# Patient Record
Sex: Female | Born: 1935 | Race: White | Hispanic: No | Smoking: Former smoker
Health system: Southern US, Community
[De-identification: ages and names within clinical notes are randomized; demographics above are authoritative.]

## PROBLEM LIST (undated history)

## (undated) DIAGNOSIS — R2681 Unsteadiness on feet: Secondary | ICD-10-CM

## (undated) DIAGNOSIS — S63105A Unspecified dislocation of left thumb, initial encounter: Secondary | ICD-10-CM

## (undated) DIAGNOSIS — M1990 Unspecified osteoarthritis, unspecified site: Secondary | ICD-10-CM

## (undated) DIAGNOSIS — K219 Gastro-esophageal reflux disease without esophagitis: Secondary | ICD-10-CM

## (undated) DIAGNOSIS — I639 Cerebral infarction, unspecified: Secondary | ICD-10-CM

## (undated) HISTORY — PX: COLON SURGERY: SHX602

## (undated) HISTORY — PX: CHOLECYSTECTOMY: SHX55

## (undated) HISTORY — PX: ECTOPIC PREGNANCY SURGERY: SHX613

---

## 1998-06-22 ENCOUNTER — Other Ambulatory Visit: Admission: RE | Admit: 1998-06-22 | Discharge: 1998-06-22 | Payer: Self-pay | Admitting: Obstetrics and Gynecology

## 1999-07-14 ENCOUNTER — Other Ambulatory Visit: Admission: RE | Admit: 1999-07-14 | Discharge: 1999-07-14 | Payer: Self-pay | Admitting: Obstetrics and Gynecology

## 2000-07-17 ENCOUNTER — Other Ambulatory Visit: Admission: RE | Admit: 2000-07-17 | Discharge: 2000-07-17 | Payer: Self-pay | Admitting: Obstetrics and Gynecology

## 2000-10-25 ENCOUNTER — Ambulatory Visit (HOSPITAL_COMMUNITY): Admission: RE | Admit: 2000-10-25 | Discharge: 2000-10-25 | Payer: Self-pay | Admitting: Gastroenterology

## 2000-10-25 ENCOUNTER — Encounter (INDEPENDENT_AMBULATORY_CARE_PROVIDER_SITE_OTHER): Payer: Self-pay | Admitting: *Deleted

## 2001-07-19 ENCOUNTER — Other Ambulatory Visit: Admission: RE | Admit: 2001-07-19 | Discharge: 2001-07-19 | Payer: Self-pay | Admitting: Obstetrics and Gynecology

## 2002-09-03 ENCOUNTER — Other Ambulatory Visit: Admission: RE | Admit: 2002-09-03 | Discharge: 2002-09-03 | Payer: Self-pay | Admitting: Obstetrics and Gynecology

## 2003-09-13 ENCOUNTER — Emergency Department (HOSPITAL_COMMUNITY): Admission: EM | Admit: 2003-09-13 | Discharge: 2003-09-13 | Payer: Self-pay | Admitting: Emergency Medicine

## 2003-09-15 ENCOUNTER — Emergency Department (HOSPITAL_COMMUNITY): Admission: EM | Admit: 2003-09-15 | Discharge: 2003-09-15 | Payer: Self-pay | Admitting: Emergency Medicine

## 2004-03-23 ENCOUNTER — Other Ambulatory Visit: Admission: RE | Admit: 2004-03-23 | Discharge: 2004-03-23 | Payer: Self-pay | Admitting: Obstetrics and Gynecology

## 2008-10-22 ENCOUNTER — Ambulatory Visit (HOSPITAL_COMMUNITY): Admission: RE | Admit: 2008-10-22 | Discharge: 2008-10-22 | Payer: Self-pay | Admitting: Internal Medicine

## 2008-11-10 ENCOUNTER — Ambulatory Visit (HOSPITAL_COMMUNITY): Admission: RE | Admit: 2008-11-10 | Discharge: 2008-11-10 | Payer: Self-pay | Admitting: General Surgery

## 2008-11-10 ENCOUNTER — Encounter (INDEPENDENT_AMBULATORY_CARE_PROVIDER_SITE_OTHER): Payer: Self-pay | Admitting: General Surgery

## 2011-01-24 LAB — COMPREHENSIVE METABOLIC PANEL
AST: 24 U/L (ref 0–37)
BUN: 18 mg/dL (ref 6–23)
CO2: 27 mEq/L (ref 19–32)
Chloride: 103 mEq/L (ref 96–112)
Creatinine, Ser: 1.15 mg/dL (ref 0.4–1.2)
GFR calc non Af Amer: 45 mL/min — ABNORMAL LOW (ref >60)
Glucose, Bld: 91 mg/dL (ref 70–99)
Total Bilirubin: 0.9 mg/dL (ref 0.3–1.2)

## 2011-01-24 LAB — CBC
HCT: 39.6 % (ref 36.0–46.0)
Hemoglobin: 13.4 g/dL (ref 12.0–15.0)
MCV: 91.2 fL (ref 78.0–100.0)
RBC: 4.34 MIL/uL (ref 3.87–5.11)
WBC: 6.8 10*3/uL (ref 4.0–10.5)

## 2011-01-24 LAB — DIFFERENTIAL
Basophils Absolute: 0 10*3/uL (ref 0.0–0.1)
Eosinophils Relative: 1 % (ref 0–5)
Lymphocytes Relative: 28 % (ref 12–46)
Neutrophils Relative %: 62 % (ref 43–77)

## 2011-02-22 NOTE — Op Note (Signed)
NAMEVONCEIL, Lindsey Hensley                 ACCOUNT NO.:  [id number]   MEDICAL RECORD NO.:  [id number]          PATIENT TYPE:  AMB   LOCATION:  SDS                          FACILITY:  [facility]   PHYSICIAN:  Ollen Gross. Vernell Morgans, M.D. DATE OF BIRTH:  10-01-1922   DATE OF PROCEDURE:  11/10/2008  DATE OF DISCHARGE:  11/10/2008                               OPERATIVE REPORT   PREOPERATIVE DIAGNOSIS:  Gallstones.   POSTOPERATIVE DIAGNOSIS:  Gallstones.   PROCEDURE:  Laparoscopic cholecystectomy with intraoperative  cholangiogram.   SURGEON:  Ollen Gross. Vernell Morgans, MD   ANESTHESIA:  General endotracheal.   PROCEDURE:  After informed consent was obtained, the patient was brought  to the operating room and placed in supine position on the operating  room table.  After adequate induction of general anesthesia, the  patient's abdomen was prepped with Betadine and draped in the usual  sterile manner.  The area below the umbilicus was infiltrated with 0.25%  Marcaine.  A small incision was made with a #15 blade knife.  This  incision was carried down through the subcutaneous tissue bluntly with a  hemostat and [employer] retractors until the linea alba was identified.  The linea alba was incised with a #15 blade knife and each side was  grasped with Kocher clamps and elevated anteriorly.  The preperitoneal  space was then probed bluntly with a hemostat until the peritoneum was  opened.  Access was gained to the abdominal cavity.  A 0 Vicryl  pursestring stitch was placed in the fascia around the opening.  A  Hasson cannula was placed through the opening and anchored in place with  a previously placed Vicryl pursestring stitch.  The abdomen was then  insufflated with carbon dioxide without difficulty.  A laparoscope was  inserted through the Hasson cannula and the right upper quadrant was  inspected.  The patient had a large floppy gallbladder and the liver was  easily identified.  Next, the epigastric region  was infiltrated with a  0.25% Marcaine.  A small incision was made with a #15 blade knife and a  10-mm port was placed bluntly through this incision into the abdominal  cavity under direct vision.   Sites were then chosen laterally on the right side of the abdomen for  placement of 5-mm ports.  Each of these areas was infiltrated with 0.25%  Marcaine.  Small stab incisions were made with a  #15 blade knife and 5-  mm ports were placed bluntly through these incisions into the abdominal  cavity under direct vision.  A blunt grasper was placed through the  lateral most 5-mm port and used to grasp the dome of the gallbladder and  elevated anteriorly and superiorly.  There were some omental adhesions  to the body of the gallbladder, which were taken down by a combination  of some blunt dissection and sharp dissection with the electrocautery as  well as with the laparoscopic scissors.  Once this was accomplished,  another blunt grasper was then placed through the other 5-mm port and  used to retract on the  body and neck of the gallbladder.  Using the  electrocautery, the peritoneal reflection at the gallbladder neck was  opened.  Blunt dissection was then carried out in this area until the  gallbladder neck cystic duct junction was readily identified and a good  window was created.  A single clip was placed on the gallbladder neck.  A small ductotomy was made just below the clip.  A 14-gauge Angiocath  was placed percutaneously through the anterior abdominal wall under  direct vision.  A Reddick cholangiogram catheter was placed through the  Angiocath and flushed.  The Reddick catheter was then placed within the  cystic duct and anchored in place the clip.  Cholangiogram was obtained  that showed no filling defects, good emptying in duodenum, and adequate  length on the cystic duct.  The common duct was fairly dilated, but  there were no filling defects and no obstruction.  Anchoring clip and   catheters were removed from the patient.  Three clips were placed  proximal to the cystic duct and duct was divided between the 2 sets of  clips.  Posterior to this, the cystic artery was identified and again  dissected bluntly in a circumferential manner until a good window was  created.  Two clips were placed proximally and 1 distally on the artery,  and the artery was divided between the 2.  Next, a laparoscopic hook  cautery device was used to separate the gallbladder from the liver bed.  There were a couple of either lymphatics or vessels on the liver bed  that were fairly prominent that were also controlled with clips.  Prior  to completely detaching the gallbladder from liver bed, the liver bed  was inspected.  Several small bleeding points were coagulated with the  electrocautery until the area was completely hemostatic.  The  gallbladder was then detached and wrested away from the liver bed  without difficulty.  A laparoscopic bag was inserted through the  epigastric port.  The gallbladder was placed in the bag and the bag was  sealed.  The abdomen was then irrigated with copious amounts of saline  until the effluent was clear.  The laparoscope was then removed through  the epigastric port.  A gallbladder grasper was placed through the  Hasson cannula and used to grasp to open the bag.  The bag with the  gallbladder was removed through the infraumbilical port without  difficulty with the Hasson cannula.  The fascial defect was then closed  with previously placed Vicryl pursestring stitch as well as with another  figure-of-eight 0 Vicryl stitch.  The rest of the ports were removed  under direct vision and were found to be hemostatic.  The gas was  allowed to escape.  The skin incisions were all closed with interrupted  4-0 Monocryl subcuticular stitches and Dermabond dressings were applied.  The patient tolerated the procedure well.  At the end of the case, all  needle, sponge, and  instrument counts were correct.  The patient was  then awakened and taken to recovery room in stable condition.      Ollen Gross. Vernell Morgans, M.D.  Electronically Signed     PST/MEDQ  D:  11/10/2008  T:  11/11/2008  Job:  [id number]

## 2011-02-25 ENCOUNTER — Inpatient Hospital Stay (HOSPITAL_COMMUNITY): Payer: Medicare HMO

## 2011-02-25 ENCOUNTER — Inpatient Hospital Stay (HOSPITAL_COMMUNITY)
Admission: AD | Admit: 2011-02-25 | Discharge: 2011-03-01 | DRG: 065 | Disposition: A | Discharge status: Home Health | Payer: Medicare HMO | Source: Ambulatory Visit | Attending: Internal Medicine | Admitting: Internal Medicine

## 2011-02-25 DIAGNOSIS — N183 Chronic kidney disease, stage 3 unspecified: Secondary | ICD-10-CM | POA: Diagnosis present

## 2011-02-25 DIAGNOSIS — I440 Atrioventricular block, first degree: Secondary | ICD-10-CM | POA: Diagnosis not present

## 2011-02-25 DIAGNOSIS — I6350 Cerebral infarction due to unspecified occlusion or stenosis of unspecified cerebral artery: Principal | ICD-10-CM | POA: Diagnosis present

## 2011-02-25 DIAGNOSIS — E559 Vitamin D deficiency, unspecified: Secondary | ICD-10-CM | POA: Diagnosis present

## 2011-02-25 DIAGNOSIS — M810 Age-related osteoporosis without current pathological fracture: Secondary | ICD-10-CM | POA: Diagnosis present

## 2011-02-25 DIAGNOSIS — E785 Hyperlipidemia, unspecified: Secondary | ICD-10-CM | POA: Diagnosis present

## 2011-02-25 DIAGNOSIS — R269 Unspecified abnormalities of gait and mobility: Secondary | ICD-10-CM | POA: Diagnosis present

## 2011-02-25 DIAGNOSIS — R7301 Impaired fasting glucose: Secondary | ICD-10-CM | POA: Diagnosis present

## 2011-02-25 DIAGNOSIS — Z79899 Other long term (current) drug therapy: Secondary | ICD-10-CM

## 2011-02-25 DIAGNOSIS — G8190 Hemiplegia, unspecified affecting unspecified side: Secondary | ICD-10-CM | POA: Diagnosis present

## 2011-02-25 DIAGNOSIS — I129 Hypertensive chronic kidney disease with stage 1 through stage 4 chronic kidney disease, or unspecified chronic kidney disease: Secondary | ICD-10-CM | POA: Diagnosis present

## 2011-02-25 DIAGNOSIS — E039 Hypothyroidism, unspecified: Secondary | ICD-10-CM | POA: Diagnosis present

## 2011-02-25 LAB — URINALYSIS, ROUTINE W REFLEX MICROSCOPIC
Bilirubin Urine: NEGATIVE
Glucose, UA: NEGATIVE mg/dL
Hgb urine dipstick: NEGATIVE
Ketones, ur: NEGATIVE mg/dL
pH: 7.5 (ref 5.0–8.0)

## 2011-02-25 LAB — CBC
HCT: 35.7 % — ABNORMAL LOW (ref 36.0–46.0)
Platelets: 171 10*3/uL (ref 150–400)
RDW: 13.8 % (ref 11.5–15.5)
WBC: 6.4 10*3/uL (ref 4.0–10.5)

## 2011-02-25 LAB — APTT: aPTT: 44 seconds — ABNORMAL HIGH (ref 24–37)

## 2011-02-25 LAB — COMPREHENSIVE METABOLIC PANEL
ALT: 13 U/L (ref 0–35)
Albumin: 4.1 g/dL (ref 3.5–5.2)
Alkaline Phosphatase: 44 U/L (ref 39–117)
Glucose, Bld: 92 mg/dL (ref 70–99)
Potassium: 3.6 mEq/L (ref 3.5–5.1)
Sodium: 143 mEq/L (ref 135–145)
Total Protein: 7.3 g/dL (ref 6.0–8.3)

## 2011-02-25 MED ORDER — GADOBENATE DIMEGLUMINE 529 MG/ML IV SOLN
12.0000 mL | Freq: Once | INTRAVENOUS | Status: AC | PRN
  Administered 2011-02-25: 12 mL via INTRAVENOUS

## 2011-02-25 NOTE — Procedures (Signed)
[facility]  Patient:    Lindsey Hensley, Lindsey Hensley                        MRN: [id number] Proc. Date: 10/25/00 Adm. Date:  [id number] Attending:  Rich Brave CC:         Esmeralda Arthur, M.D.   Procedure Report  PROCEDURE PERFORMED:  Colonoscopy with biopsy.  ENDOSCOPIST:  Florencia Reasons, M.D.  INDICATIONS FOR PROCEDURE:  The patient is a 77 year old with apparent family history of colon polyps, for colon cancer screen.  FINDINGS:  Tiny distal rectal polyp, biopsied.  Small distal rectal ulcerations, possibly prep related.  DESCRIPTION OF PROCEDURE:  The nature, purpose and risks of the procedure had been discussed with the patient, who provided written consent.  Sedation was fentanyl 50 mcg and Versed 7 mg IV without arrhythmias or desaturation. The Olympus colonoscope was advanced to the cecum and pullback was then initiated.  The quality of the prep was excellent and it is felt that all areas were well seen.  We used the adult video colonoscope for this procedure and the procedure went fairly smoothly by use of that scope.  The quality of the prep was excellent and it is felt that all areas were well seen.  There was a tiny hyperplastic appearing sessile polyp in the rectum removed by cold biopsy and there were a couple of small shallow ulcerations measuring about 2 to 3 mm across with surrounding erythema in the distal rectum, etiology unclear, which I biopsied.  No diverticulosis, large polyps, cancer, colitis or vascular malformations.  The patient tolerated the procedure well and there were no apparent complications.  IMPRESSION:  Essentially normal exam.  Tiny rectal polyp and distal rectal ulcerations as described above, not felt to be particularly clinically significant.  PLAN:  Await pathology on todays biopsies.  Anticipate follow-up colonoscopy in five years because of the apparent family history of colon polyps. DD:   10/25/00 TD:  10/26/00 Job: [id number]

## 2011-02-26 LAB — LIPID PANEL
HDL: 88 mg/dL (ref >39)
Total CHOL/HDL Ratio: 2.2 RATIO

## 2011-02-26 LAB — CARDIAC PANEL(CRET KIN+CKTOT+MB+TROPI)
CK, MB: 3.1 ng/mL (ref 0.3–4.0)
Relative Index: 1.7 (ref 0.0–2.5)
Troponin I: <0.3 ng/mL (ref <0.30)

## 2011-02-27 DIAGNOSIS — I059 Rheumatic mitral valve disease, unspecified: Secondary | ICD-10-CM

## 2011-02-28 DIAGNOSIS — I6330 Cerebral infarction due to thrombosis of unspecified cerebral artery: Secondary | ICD-10-CM

## 2011-02-28 LAB — CBC
MCH: 30.6 pg (ref 26.0–34.0)
Platelets: 204 10*3/uL (ref 150–400)
RBC: 4.21 MIL/uL (ref 3.87–5.11)
RDW: 13.6 % (ref 11.5–15.5)
WBC: 5 10*3/uL (ref 4.0–10.5)

## 2011-02-28 LAB — BASIC METABOLIC PANEL
Chloride: 103 mEq/L (ref 96–112)
Creatinine, Ser: 1.03 mg/dL (ref 0.4–1.2)
GFR calc Af Amer: >60 mL/min (ref >60)
GFR calc non Af Amer: 51 mL/min — ABNORMAL LOW (ref >60)
Potassium: 3.7 mEq/L (ref 3.5–5.1)

## 2011-03-10 NOTE — H&P (Signed)
Lindsey, Hensley                 ACCOUNT NO.:  [id number]  MEDICAL RECORD NO.:  [id number]           PATIENT TYPE:  I  LOCATION:  3012                         FACILITY:  [facility]  PHYSICIAN:  Kari Baars, M.D.  DATE OF BIRTH:  [birth year]  DATE OF ADMISSION:  02/25/2011 DATE OF DISCHARGE:                             HISTORY & PHYSICAL   HISTORY OF PRESENT ILLNESS:  Ms. Lindsey Hensley is an 77 year old white female with history of hypertension, impaired fasting glucose, and prior history of retinal hemorrhage who presented to our office today with complaint of unsteady gait and left leg weakness x5 days.  Ms. Lindsey Hensley recently had an ambulatory blood pressure cuff to evaluate for white coat hypertension.  Her average blood pressure was 154/77 with a blood pressure in our office of 186/92 consistent with hypertension with an element of white-coat hypertension.  She was started on lisinopril about 2 weeks ago for this which she tolerated fairly well for about 1 week. She did have slight chest feeling, which was different, but did not have any dizziness.  On about the 8th day of taking the medications, she awoke at 3 a.m. and tried to walk to the bathroom when she became lightheaded and fell and hit her head on the bathtub.  She states that she had difficulty getting up in the bathtub and noticed that her left leg was weak.  She stopped the lisinopril as her son read the side effects and stated that it could cause dizziness and leg numbness. However, her symptoms have persisted throughout this week and she called our office today and was brought in late this afternoon for evaluation. Her main complaint at this point is continued left leg weakness and numbness with difficulty walking due to dragging that leg.  She has a record of her blood pressures, which were in the 128s over 70s while she was on the medication and had been in the 148s over 70s since she stopped it.  She has no prior history  of stroke.  She has not been taking an aspirin.  She has not had any bleeding issues.  REVIEW OF SYSTEMS:  She does complain of some left rib tenderness.  She suffered a scalp hematoma after a fall, which is mildly tender.  All other systems are negative except as noted in the HPI.  PAST MEDICAL HISTORY: 1. Hypertension. 2. Impaired fasting glucose. 3. Remote history of retinal hemorrhage. 4. Chronic renal insufficiency, stage III. 5. Osteoporosis with vitamin D deficiency -- declines therapy due to     perceived bisphosphonate intolerance. 6. History of microscopic hematuria. 7. History of elevated liver function tests secondary to     cholelithiasis (10/2008) status post cholecystectomy. 8. History of left ankle fracture (01/11). 9. Status post tonsillectomy and adenoidectomy. 10.Status post ectopic pregnancy (1962). 11.Status post bilateral cataract repairs. 12.Status post cholecystectomy.  CURRENT MEDICATIONS: 1. Lisinopril 20 mg daily -- she has not taken this for the past 5     days. 2. Diazepam 5 mg one-half b.i.d. p.r.n. 3. Vitamin D 1000 units daily. 4. Calcium/vitamin D b.i.d. 5. Multivitamin daily.  ALLERGIES: 1. FOSAMAX and ACTONEL caused arthralgias. 2. SULFA caused nausea.  SOCIAL HISTORY:  She is widowed for 31 years.  She has one son who accompanies her today.  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  Smoked one-half pack per day for 20 years, but quit in 01/2008. Occasional alcohol.  FAMILY HISTORY:  Father died at 1.  Her mother died of 8 from meningitis.  Sister died at 35 due to diabetes.  She has 4 brothers, one of whom has diabetes.  Her son has gout.  PHYSICAL EXAMINATION:  VITAL SIGNS:  Blood pressure 166/86, pulse 77, respirations 20, weight 127, which is down 3 pounds since 04/19. GENERAL:  Pleasant female in no acute distress. HEENT:  Pupils are equal, round, and reactive to light.  Extraocular movements intact.  Oropharynx  moist. NECK:  Supple without lymphadenopathy, JVD, or carotid bruits. HEART:  Regular rate and rhythm without murmurs, rubs, or gallops. LUNGS:  Clear to auscultation bilaterally. ABDOMEN:  Soft, nondistended, nontender with normoactive bowel sounds. EXTREMITIES:  No clubbing, cyanosis, or edema. NEUROLOGIC:  Cranial nerves II through XII are intact.  Motor strength is 4- out of 5 in the left lower extremity and 5/5 in all other extremities.  Deep tendon reflexes are 2+ and symmetric.  She has a decrease in sensation to monofilament temperature and vibration of the left foot.  Her gait shows dragging of the left leg.  Romberg is negative.  Finger-to-nose is intact.  ASSESSMENT/PLAN: 1. Probable subacute stroke with left hemiparesis -- she has left leg     weakness, sensory changes, and gait instability which is concerning     for a subacute right-sided stroke.  Her symptoms began 5 days ago,     so lytics are not an option.  Differential diagnosis does include a     subdural hematoma related to her fall.  She does not have any pain     over the left hip to suggest any hip injury as the cause of her     gait and sensory changes.  She will be admitted to the hospital for     further evaluation with an MRI/MRA of the brain tonight.  If we are     unable to obtain this in a timely fashion, we will proceed with CT     of the head to rule out subdural hematoma.  At this point, MRI     should be sufficient in evaluating for both stroke and subacute     bleed.  If stroke is confirmed, I suspect it will further evaluate     for secondary causes with carotid Dopplers, echocardiogram, and     laboratory studies to include fasting lipids, sed rate, and     hemoglobin A1c.  PT/OT evaluation.  We will perform a     speech/swallow evaluation, although she does not have any apparent     speech issues to suggest a risk of dysphagia.  She will be started     on aspirin if her scan is negative for  bleed. 2. Unsteady gait -- secondary to probable subacute stroke with left     hemiparesis.  PT/OT evaluation.  Fall precautions. 3. Hypertension -- she felt like the symptoms were related to     lisinopril, so remains reticent to start back on an ACE inhibitor.     If her blood pressure remains elevated, consider low-dose     alternatives such as amlodipine.  At this  point, blood pressure     goal on the setting of her stroke would be around 140/85 with     avoidance of     hypotension. 4. Disposition -- anticipate discharge in 2-3 days once evaluation is     completed.  Evaluation may be slower over the weekend.     Kari Baars, M.D.     WS/MEDQ  D:  02/25/2011  T:  02/25/2011  Job:  [id number]  Electronically Signed by W. Buren Kos M.D. on 03/10/2011 04:30:34 PM

## 2011-03-10 NOTE — Discharge Summary (Signed)
Lindsey Hensley, Lindsey Hensley                 ACCOUNT NO.:  [id number]  MEDICAL RECORD NO.:  [id number]           PATIENT TYPE:  I  LOCATION:  3012                         FACILITY:  [facility]  PHYSICIAN:  Kari Baars, M.D.  DATE OF BIRTH:  11-09-1921  DATE OF ADMISSION:  02/25/2011 DATE OF DISCHARGE:  03/01/2011                              DISCHARGE SUMMARY   DISCHARGE DIAGNOSES: 1. Acute right frontal cerebrovascular accident resulting in left     hemiparesis, gait instability. 2. Hypertension. 3. Hyperlipidemia. 4. Subacute hypothyroidism. 5. Impaired fasting glucose. 6. Remote history of retinal hemorrhage. 7. Chronic renal insufficiency, stage III. 8. Osteoporosis with vitamin D deficiency (declined therapy due to     bisphosphate intolerance). 9. History of microscopic hematuria. 10.History of elevated liver function tests secondary to     cholelithiasis (January 2010), status post cholecystectomy. 11.History of left ankle fracture (January 2011). 12.Status post tonsillectomy and adenoidectomy. 13.Status post ectopic pregnancy (1962). 14.Status post bilateral cataract repairs. 15.Status post cholecystectomy.  DISCHARGE MEDICATIONS: 1. Aspirin 325 mg daily. 2. Lipitor 10 mg nightly. 3. Vitamin D 2000 units daily. 4. Calcium with vitamin D b.i.d. 5. Multivitamin daily. 6. She was instructed to stop her lisinopril.  HOSPITAL PROCEDURES: 1. MRI/MRA of the brain in neck (Feb 26, 2011), multifocal areas of     subcentimeter acute infarction affecting the right frontal     parasagittal cortex and subcortical white matter.  Findings     represent acute anterior cerebral artery thrombosis or embolus.     Distal right anterior cerebral artery truncation most consistent     with thrombosis.  Unremarkable MR angiography of the extracranial     circulation. 2. Transthoracic echocardiogram (May 2o, 2012), no cardiac source of     emboli identified.  No defect or patent foramen ovale  identified.     Ejection fraction 55-60% with mild mitral and aortic regurgitation. 3. Carotid Dopplers/transcranial Dopplers (Feb 28, 2011), no internal     carotid artery stenosis.  HOSPITAL CONSULTS: 1. Physical Therapy/Occupational Therapy/Speech Therapy. 2. [facility] inpatient rehabilitation.  HISTORY OF PRESENT ILLNESS:  For full details, please see dictated history and physical.  Briefly, Lindsey Hensley is an 77 year old white female with a history of hypertension, impaired fasting glucose, and prior retinal hemorrhage who presented to our office on the day of admission with a complaint of unsteady gait and left leg weakness for 5 days.  The patient had recently undergone an ambulatory blood pressure monitor in our office to evaluate for markedly elevated blood pressures in our office.  Her blood pressure in the office was 186/92 and her 24- hour ambulatory blood pressure cuff was 154/77 consistent with hypertension with an element of white coat hypertension.  Given these findings, she was started on lisinopril which she tolerated well for about a week.  However, on the eighth day of taking the medication, she awoke at 3 a.m. and felt very dizzy when she fell and hit her head on the bathtub.  She noticed when she tried to get up that her left leg was weak.  At that point, thinking that the  medication was the culprit, she stopped the lisinopril.  However, her symptoms failed to improve over the following 5 days and she called our office on Friday afternoon Feb 25, 2011 and was brought into the office for evaluation.  In the office, she was found to have significant gait instability and left leg weakness concerning for an subacute stroke.  She was admitted for further management.  HOSPITAL COURSE:  The patient was admitted to a telemetry bed.  She underwent an MRI/MRA of the brain to evaluate which showed findings consistent with multifocal right frontal stroke as above.  She was placed  on aspirin therapy promptly upon admission and secondary evaluation was undertaken with echocardiogram and carotid Doppler/transcranial Dopplers.  These showed no significant internal carotid artery stenosis.  There was no identifiable cardiac emboli or significant valvular defects.  No PFO seen on echocardiogram.  Physical Therapy and Occupational Therapy as well as Speech Therapy saw the patient and recommended 24-hour assistance.  Her sister is arriving today from [place] and will be staying with her indefinitely to provide 24-hour care.  She does continue to have gait instability with left leg weakness and some element of neglect.  However, she is able to ambulate with a walker and with assistance and all are in agreement that she is stable for discharge home with 24-hour assistance.  Of note, her fasting lipid profile did show an LDL of 86.  Given the ideal range of less than 70 in the setting of a stroke, she will be started on low-dose statin therapy.  Her blood pressure remained much lower throughout the majority of this hospitalization.  She did have some periods of 160, but the majority of the blood pressures were in the 120s.  She even had isolated hypotension with pressures into the 90s. Given this lability and overall improvement, she has remained off of antihypertensive therapy and will discontinue lisinopril upon discharge.  DISCHARGE LABS:  CBC shows a white count of 5.0, hemoglobin 12.9, platelets 204.  BMET shows sodium 140, potassium 3.7, chloride 103, bicarb 30, BUN 22, creatinine 1.03, glucose 76.  Fasting lipid profile shows a total cholesterol 190, triglyceride 80, HDL 88, LDL 86, hemoglobin A1c 5.6.  TSH 6.969 - this will need to be rechecked as an outpatient with consideration of treatment for subclinical hypothyroidism.  Sedimentation rate 13.  CONDITION ON DISCHARGE:  Improved.  DISCHARGE DIET:  Cardiac prudent.  DISCHARGE INSTRUCTIONS:  She was instructed  to use a walker at all times and to have 24-hour assistance.  She should practice strict fall precautions due to her high risk of fall.  We will arrange home health physical therapy and occupational therapy.  HOSPITAL FOLLOWUP:  She will follow up with Dr. Clelia Croft within the next 7- 10 days (she has an appointment artery scheduled on March 11, 2011, which we will suffice).  DISPOSITION:  To home with her sister.  TIME OF DISCHARGE ACTIVITIES:  Forty minutes.     Kari Baars, M.D.     WS/MEDQ  D:  03/01/2011  T:  03/01/2011  Job:  [id number]  Electronically Signed by W. Buren Kos M.D. on 03/10/2011 04:30:38 PM

## 2011-07-26 ENCOUNTER — Other Ambulatory Visit: Payer: Self-pay | Admitting: Radiology

## 2013-05-23 ENCOUNTER — Emergency Department (HOSPITAL_COMMUNITY): Payer: Medicare HMO

## 2013-05-23 ENCOUNTER — Inpatient Hospital Stay (HOSPITAL_COMMUNITY)
Admission: EM | Admit: 2013-05-23 | Discharge: 2013-05-29 | DRG: 470 | Disposition: A | Discharge status: Skilled Nursing Facility | Payer: Medicare HMO | Attending: Internal Medicine | Admitting: Internal Medicine

## 2013-05-23 ENCOUNTER — Encounter (HOSPITAL_COMMUNITY): Payer: Self-pay | Admitting: Emergency Medicine

## 2013-05-23 DIAGNOSIS — Z7901 Long term (current) use of anticoagulants: Secondary | ICD-10-CM

## 2013-05-23 DIAGNOSIS — IMO0002 Reserved for concepts with insufficient information to code with codable children: Secondary | ICD-10-CM

## 2013-05-23 DIAGNOSIS — D62 Acute posthemorrhagic anemia: Secondary | ICD-10-CM | POA: Diagnosis not present

## 2013-05-23 DIAGNOSIS — Z9089 Acquired absence of other organs: Secondary | ICD-10-CM

## 2013-05-23 DIAGNOSIS — R296 Repeated falls: Secondary | ICD-10-CM | POA: Diagnosis present

## 2013-05-23 DIAGNOSIS — Z87891 Personal history of nicotine dependence: Secondary | ICD-10-CM

## 2013-05-23 DIAGNOSIS — R29898 Other symptoms and signs involving the musculoskeletal system: Secondary | ICD-10-CM | POA: Diagnosis present

## 2013-05-23 DIAGNOSIS — D649 Anemia, unspecified: Secondary | ICD-10-CM

## 2013-05-23 DIAGNOSIS — Z882 Allergy status to sulfonamides status: Secondary | ICD-10-CM

## 2013-05-23 DIAGNOSIS — Z7982 Long term (current) use of aspirin: Secondary | ICD-10-CM

## 2013-05-23 DIAGNOSIS — I69998 Other sequelae following unspecified cerebrovascular disease: Secondary | ICD-10-CM

## 2013-05-23 DIAGNOSIS — K5900 Constipation, unspecified: Secondary | ICD-10-CM | POA: Diagnosis present

## 2013-05-23 DIAGNOSIS — Z79899 Other long term (current) drug therapy: Secondary | ICD-10-CM

## 2013-05-23 DIAGNOSIS — S72009A Fracture of unspecified part of neck of unspecified femur, initial encounter for closed fracture: Principal | ICD-10-CM | POA: Diagnosis present

## 2013-05-23 DIAGNOSIS — Z8673 Personal history of transient ischemic attack (TIA), and cerebral infarction without residual deficits: Secondary | ICD-10-CM

## 2013-05-23 DIAGNOSIS — Y93H2 Activity, gardening and landscaping: Secondary | ICD-10-CM

## 2013-05-23 DIAGNOSIS — I951 Orthostatic hypotension: Secondary | ICD-10-CM | POA: Diagnosis not present

## 2013-05-23 DIAGNOSIS — S72002A Fracture of unspecified part of neck of left femur, initial encounter for closed fracture: Secondary | ICD-10-CM

## 2013-05-23 DIAGNOSIS — Y92009 Unspecified place in unspecified non-institutional (private) residence as the place of occurrence of the external cause: Secondary | ICD-10-CM

## 2013-05-23 DIAGNOSIS — D72829 Elevated white blood cell count, unspecified: Secondary | ICD-10-CM | POA: Diagnosis not present

## 2013-05-23 DIAGNOSIS — K219 Gastro-esophageal reflux disease without esophagitis: Secondary | ICD-10-CM | POA: Diagnosis present

## 2013-05-23 DIAGNOSIS — Z602 Problems related to living alone: Secondary | ICD-10-CM

## 2013-05-23 HISTORY — DX: Unspecified osteoarthritis, unspecified site: M19.90

## 2013-05-23 HISTORY — DX: Gastro-esophageal reflux disease without esophagitis: K21.9

## 2013-05-23 HISTORY — DX: Cerebral infarction, unspecified: I63.9

## 2013-05-23 LAB — BASIC METABOLIC PANEL
BUN: 20 mg/dL (ref 6–23)
CO2: 27 mEq/L (ref 19–32)
Chloride: 101 mEq/L (ref 96–112)
Creatinine, Ser: 0.99 mg/dL (ref 0.50–1.10)
GFR calc Af Amer: 57 mL/min — ABNORMAL LOW (ref >90)
Glucose, Bld: 107 mg/dL — ABNORMAL HIGH (ref 70–99)
Potassium: 3.7 mEq/L (ref 3.5–5.1)

## 2013-05-23 LAB — CBC WITH DIFFERENTIAL/PLATELET
Basophils Absolute: 0 10*3/uL (ref 0.0–0.1)
Basophils Relative: 0 % (ref 0–1)
Eosinophils Relative: 0 % (ref 0–5)
HCT: 33.5 % — ABNORMAL LOW (ref 36.0–46.0)
Hemoglobin: 10.8 g/dL — ABNORMAL LOW (ref 12.0–15.0)
Lymphocytes Relative: 15 % (ref 12–46)
Lymphs Abs: 2.3 10*3/uL (ref 0.7–4.0)
MCV: 91.5 fL (ref 78.0–100.0)
Monocytes Relative: 22 % — ABNORMAL HIGH (ref 3–12)
Neutro Abs: 9.4 10*3/uL — ABNORMAL HIGH (ref 1.7–7.7)
RDW: 14 % (ref 11.5–15.5)
WBC: 15 10*3/uL — ABNORMAL HIGH (ref 4.0–10.5)

## 2013-05-23 LAB — URINALYSIS, ROUTINE W REFLEX MICROSCOPIC
Bilirubin Urine: NEGATIVE
Glucose, UA: NEGATIVE mg/dL
Ketones, ur: NEGATIVE mg/dL
Leukocytes, UA: NEGATIVE
Protein, ur: NEGATIVE mg/dL

## 2013-05-23 MED ORDER — ACETAMINOPHEN 650 MG RE SUPP: 650.0000 mg | Freq: Four times a day (QID) | RECTAL | Status: DC | PRN

## 2013-05-23 MED ORDER — CEFAZOLIN SODIUM-DEXTROSE 2-3 GM-% IV SOLR
2.0000 g | INTRAVENOUS | Status: AC
  Administered 2013-05-24: 2 g via INTRAVENOUS
  Filled 2013-05-23: qty 50

## 2013-05-23 MED ORDER — ACETAMINOPHEN 325 MG PO TABS
650.0000 mg | ORAL_TABLET | Freq: Four times a day (QID) | ORAL | Status: DC | PRN
  Administered 2013-05-23: 650 mg via ORAL
  Filled 2013-05-23: qty 2

## 2013-05-23 NOTE — Consult Note (Signed)
ORTHOPAEDIC CONSULTATION  REQUESTING PHYSICIAN: Derwood Kaplan, MD  Chief Complaint: Left hip pain  HPI: Lindsey Hensley is a 79 y.o. female who complains of  left hip pain after fall occurred last night. She is apparently gardening, got up, felt woozy, and fell. She was unable to walk, but she did get herself back inside her home. She lives by herself. Pain is rated as moderate to severe, worse with weightbearing and movement, better with pain medications. She was brought to the emergency room and evaluated. She does have a past history of a stroke, which apparently affected her left side. She expressed to her son when she fell that she thought she had another stroke.  Past Medical History  Diagnosis Date  . Stroke    No past surgical history on file. History   Social History  . Marital Status: Widowed    Spouse Name: N/A    Number of Children: N/A  . Years of Education: N/A   Social History Main Topics  . Smoking status: None  . Smokeless tobacco: None  . Alcohol Use: None  . Drug Use: None  . Sexual Activity: None   Other Topics Concern  . None   Social History Narrative  . None   she is a nonsmoker, and lives alone. No family history on file. mother died of meningitis, approximately age 45. Allergies  Allergen Reactions  . Sulfa Antibiotics Other (See Comments)    unknown   Prior to Admission medications   Medication Sig Start Date End Date Taking? Authorizing Provider  aspirin 325 MG tablet Take 650 mg by mouth once.   Yes Historical Provider, MD  Calcium Carb-Cholecalciferol (CALCIUM-VITAMIN D) 600-400 MG-UNIT TABS Take 1 tablet by mouth daily.   Yes Historical Provider, MD  Multiple Vitamin (MULTIVITAMIN WITH MINERALS) TABS tablet Take 1 tablet by mouth daily.   Yes Historical Provider, MD  POTASSIUM PO Take 1 tablet by mouth daily. Possibly 500mg  tablet   Yes Historical Provider, MD  Soft Lens Products (REWETTING DROPS) SOLN 2 drops by Does not apply route  every evening. For dry eyes   Yes Historical Provider, MD  vitamin C (ASCORBIC ACID) 500 MG tablet Take 500 mg by mouth daily.   Yes Historical Provider, MD   Dg Hip Complete Left  05/23/2013   *RADIOLOGY REPORT*  Clinical Data: Fall with left hip pain.  LEFT HIP - COMPLETE 2+ VIEW  Comparison: None.  Findings: There is a displaced fracture of the left femoral neck with superior displacement of the distal fracture fragment. Femoral head remains located in its acetabulum.  Right hip is unremarkable.  IMPRESSION: Left femoral neck fracture.   Original Report Authenticated By: Leanna Battles, M.D.   Dg Chest Port 1 View  05/23/2013   *RADIOLOGY REPORT*  Clinical Data: Fall, weakness  PORTABLE CHEST - 1 VIEW  Comparison: Feb 25, 2011.  Findings: Cardiomediastinal silhouette appears normal.  No acute pulmonary disease is noted.  No pleural effusion or pneumothorax is noted.  Bony thorax is intact.  IMPRESSION: No acute cardiopulmonary abnormality seen.   Original Report Authenticated By: Lupita Raider.,  M.D.    Positive ROS: All other systems have been reviewed and were otherwise negative with the exception of those mentioned in the HPI and as above.  Physical Exam: General: Alert, no acute distress mildly frail, lying in bed. Cardiovascular: Trace pedal edema Respiratory: No cyanosis, no use of accessory musculature GI: No organomegaly, abdomen is soft and non-tender Skin:  No lesions in the area of chief complaint Neurologic: Sensation intact distally, and her extraocular movements are intact, and her tongue is midline. Psychiatric: Patient is competent for consent with normal mood and affect Lymphatic: No axillary or cervical lymphadenopathy  MUSCULOSKELETAL: Left hip is shortened and externally rotated, with positive log roll. EHL and FHL are firing. The right side is nontender.  Assessment: Displaced left femoral neck fracture, history of stroke  Plan: She is going to be admitted to the  internal medicine service and optimized. Clinically it does not appear that she's had another stroke, but I will defer to the internal medicine physicians regarding this workup. She does have a displaced femoral neck fracture, and is going to need hemiarthroplasty in order to regain angulatory function. This is an acute severe problem, which does pose risks of both morbidity and mortality.   The risks benefits and alternatives were discussed with the patient including but not limited to the risks of nonoperative treatment, versus surgical intervention including infection, bleeding, nerve injury, malunion, nonunion, the need for revision surgery, hardware prominence, hardware failure, the need for hardware removal, blood clots, cardiopulmonary complications, morbidity, mortality, among others, and they were willing to proceed.    We are going to plan for left hip hemiarthroplasty, probably Friday approximately 12:00 noon. There is operative time available at [facility], and we will plan to transfer her there. I appreciate the [facility] service and preoperative optimization.    Eulas Post, MD Cell [phone]   05/23/2013 1:43 PM

## 2013-05-23 NOTE — ED Notes (Signed)
Report given to Arnoldo Morale RN at [facility].

## 2013-05-23 NOTE — ED Notes (Signed)
Patient fell while doing yard work.  Patient initially walking on it but it became too painful on left side, so son called EMS.  Shortening and slight inward rotation.  Pain level a 4-5.

## 2013-05-23 NOTE — ED Notes (Signed)
Bed: WA09 Expected date:  Expected time:  Means of arrival:  Comments: 79yo/f fall-hip pain with shortening

## 2013-05-23 NOTE — ED Provider Notes (Signed)
CSN: [id number]     Arrival date & time 05/23/13  4098 History     First MD Initiated Contact with Patient 05/23/13 0957     Chief Complaint  Patient presents with  . Fall   (Consider location/radiation/quality/duration/timing/severity/associated sxs/prior Treatment) HPI Comments: Pt comes in with cc of fall. Pt was doing some yard work in the evening, got up, got lightheaded and fell. She has some left hip pain, but continues to walk. This AM, she couldn't get up due to pain. Denies headache, No nausea, vomiting, visual complains, seizures, altered mental status, loss of consciousness, new weakness, or numbness.  Patient is a 79 y.o. female presenting with fall. The history is provided by the patient.  Fall Pertinent negatives include no chest pain, no abdominal pain, no headaches and no shortness of breath.    Past Medical History  Diagnosis Date  . Stroke    No past surgical history on file. No family history on file. History  Substance Use Topics  . Smoking status: Not on file  . Smokeless tobacco: Not on file  . Alcohol Use: Not on file   OB History   Grav Para Term Preterm Abortions TAB SAB Ect Mult Living                 Review of Systems  Constitutional: Negative for activity change.  HENT: Negative for neck pain.   Respiratory: Negative for shortness of breath.   Cardiovascular: Negative for chest pain.  Gastrointestinal: Negative for nausea, vomiting and abdominal pain.  Genitourinary: Negative for dysuria.  Musculoskeletal: Positive for arthralgias and gait problem.  Neurological: Negative for headaches.    Allergies  Sulfa antibiotics  Home Medications   Current Outpatient Rx  Name  Route  Sig  Dispense  Refill  . aspirin 325 MG tablet   Oral   Take 650 mg by mouth once.         . Calcium Carb-Cholecalciferol (CALCIUM-VITAMIN D) 600-400 MG-UNIT TABS   Oral   Take 1 tablet by mouth daily.         . Multiple Vitamin (MULTIVITAMIN WITH  MINERALS) TABS tablet   Oral   Take 1 tablet by mouth daily.         Marland Kitchen POTASSIUM PO   Oral   Take 1 tablet by mouth daily. Possibly 500mg  tablet         . Soft Lens Products (REWETTING DROPS) SOLN   Does not apply   2 drops by Does not apply route every evening. For dry eyes         . vitamin C (ASCORBIC ACID) 500 MG tablet   Oral   Take 500 mg by mouth daily.          BP 140/65  Pulse 78  Temp(Src) 99 F (37.2 C) (Oral)  Resp 20  SpO2 98% Physical Exam  Nursing note and vitals reviewed. Constitutional: She is oriented to person, place, and time. She appears well-developed and well-nourished.  HENT:  Head: Normocephalic and atraumatic.  Eyes: EOM are normal. Pupils are equal, round, and reactive to light.  Neck: Neck supple.  No midline c-spine tenderness, pt able to turn head to 45 degrees bilaterally without any pain and able to flex neck to the chest and extend without any pain or neurologic symptoms.   Cardiovascular: Normal rate, regular rhythm and normal heart sounds.   No murmur heard. Pulmonary/Chest: Effort normal. No respiratory distress.  Abdominal: Soft. She exhibits no distension.  There is no tenderness. There is no rebound and no guarding.  Musculoskeletal:  Left femur tenderness, and deformity. OTHERWISE Head to toe evaluation shows no hematoma, bleeding of the scalp, no facial abrasions, step offs, crepitus, no tenderness to palpation of the bilateral upper and lower extremities, no gross deformities, no chest tenderness, no pelvic pain.   Neurological: She is alert and oriented to person, place, and time.  Skin: Skin is warm and dry.    ED Course   Procedures (including critical care time)  Labs Reviewed  CBC WITH DIFFERENTIAL - Abnormal; Notable for the following:    WBC 15.0 (*)    RBC 3.66 (*)    Hemoglobin 10.8 (*)    HCT 33.5 (*)    Monocytes Relative 22 (*)    Neutro Abs 9.4 (*)    Monocytes Absolute 3.3 (*)    All other  components within normal limits  BASIC METABOLIC PANEL  TROPONIN I   Dg Hip Complete Left  05/23/2013   *RADIOLOGY REPORT*  Clinical Data: Fall with left hip pain.  LEFT HIP - COMPLETE 2+ VIEW  Comparison: None.  Findings: There is a displaced fracture of the left femoral neck with superior displacement of the distal fracture fragment. Femoral head remains located in its acetabulum.  Right hip is unremarkable.  IMPRESSION: Left femoral neck fracture.   Original Report Authenticated By: Leanna Battles, M.D.   No diagnosis found.  MDM  DDx includes: - Mechanical falls - ICH - Fractures - Contusions - Soft tissue injury  Pt comes in post fall. Has femoral neck fracture. Dr. Dion Saucier to perform hip replacement tomorrow, AT [facility]. Will admit to [facility].   Date: 05/23/2013  Rate: 74  Rhythm: normal sinus rhythm  QRS Axis: normal  Intervals: PR prolonged  ST/T Wave abnormalities: nonspecific ST/T changes  Conduction Disutrbances:none  Narrative Interpretation:   Old EKG Reviewed: none available    Derwood Kaplan, MD 05/23/13 1331

## 2013-05-23 NOTE — H&P (Signed)
[facility] History and Physical  Lindsey Hensley:[id number] DOB: 1922/06/21 DOA: 05/23/2013  Referring physician: Dr. Rhunette Croft PCP: Kari Baars, MD  Specialists: Orthopedic surgery  Chief Complaint: hip fracture  HPI: Lindsey Hensley is a 79 y.o. female has a past medical history significant for previous CVA with mild left-sided weakness, was able to live independently and do all her ADLs, and no other major medical problems, comes in with a chief complaint of left hip pain after she fell while working in her garden last night. She felt fine afterwards, and was able to walk, however this morning she noticed increasing pain and she decided to get checked out in the emergency room. Hip x-ray showed left femoral neck fracture. Orthopedic surgery has been consulted by a physician, and recommended admission to [place] for surgery in the morning. Patient denies any fever or chills, denies any chest pain or breathing difficulties, denies any lightheadedness or dizziness, she denies any loss of consciousness yesterday, and he is otherwise feeling at baseline.  Review of Systems:  as per history of present illness, otherwise negative  Past Medical History  Diagnosis Date  . Stroke    No past surgical history on file. Social History:  has no tobacco, alcohol, and drug history on file.  Allergies  Allergen Reactions  . Sulfa Antibiotics Other (See Comments)    unknown    Family history is noncontributory  Prior to Admission medications   Medication Sig Start Date End Date Taking? Authorizing Provider  aspirin 325 MG tablet Take 650 mg by mouth once.   Yes Historical Provider, MD  Calcium Carb-Cholecalciferol (CALCIUM-VITAMIN D) 600-400 MG-UNIT TABS Take 1 tablet by mouth daily.   Yes Historical Provider, MD  Multiple Vitamin (MULTIVITAMIN WITH MINERALS) TABS tablet Take 1 tablet by mouth daily.   Yes Historical Provider, MD  POTASSIUM PO Take 1 tablet by mouth daily. Possibly  500mg  tablet   Yes Historical Provider, MD  Soft Lens Products (REWETTING DROPS) SOLN 2 drops by Does not apply route every evening. For dry eyes   Yes Historical Provider, MD  vitamin C (ASCORBIC ACID) 500 MG tablet Take 500 mg by mouth daily.   Yes Historical Provider, MD   Physical Exam: Filed Vitals:   05/23/13 0958 05/23/13 1251  BP: 140/65   Pulse: 78   Temp: 99 F (37.2 C) 98.6 F (37 C)  TempSrc: Oral   Resp: 20   SpO2: 98%     General:  No apparent distress, pleasant Caucasian female alert and oriented x4   Eyes: PERRL, EOMI, no scleral icterus  ENT: moist oropharynx  Neck: supple, no JVD  Cardiovascular: regular rate without MRG; 2+ peripheral pulses  Respiratory: CTA biL, good air movement without wheezing, rhonchi or crackled  Abdomen: soft, non tender to palpation, positive bowel sounds, no guarding, no rebound  Skin: no rashes  Musculoskeletal: no peripheral edema  Psychiatric: normal mood and affect  Neurologic: grossly nonfocal  Labs on Admission:  Basic Metabolic Panel:  Recent Labs Lab 05/23/13 1130  NA 137  K 3.7  CL 101  CO2 27  GLUCOSE 107*  BUN 20  CREATININE 0.99  CALCIUM 9.0   CBC:  Recent Labs Lab 05/23/13 1130  WBC 15.0*  NEUTROABS 9.4*  HGB 10.8*  HCT 33.5*  MCV 91.5  PLT 153   Cardiac Enzymes:  Recent Labs Lab 05/23/13 1130  TROPONINI <0.30   Radiological Exams on Admission: Dg Hip Complete Left  05/23/2013   *  RADIOLOGY REPORT*  Clinical Data: Fall with left hip pain.  LEFT HIP - COMPLETE 2+ VIEW  Comparison: None.  Findings: There is a displaced fracture of the left femoral neck with superior displacement of the distal fracture fragment. Femoral head remains located in its acetabulum.  Right hip is unremarkable.  IMPRESSION: Left femoral neck fracture.   Original Report Authenticated By: Leanna Battles, M.D.   Dg Chest Port 1 View  05/23/2013   *RADIOLOGY REPORT*  Clinical Data: Fall, weakness  PORTABLE CHEST -  1 VIEW  Comparison: Feb 25, 2011.  Findings: Cardiomediastinal silhouette appears normal.  No acute pulmonary disease is noted.  No pleural effusion or pneumothorax is noted.  Bony thorax is intact.  IMPRESSION: No acute cardiopulmonary abnormality seen.   Original Report Authenticated By: Lupita Raider.,  M.D.    EKG: Independently reviewed.  Assessment/Plan Active Problems:   Hip fracture, left   History of CVA (cerebrovascular accident)  Hip fracture - status post ground-level fall, without loss of consciousness. She will require surgery, in the morning. Appreciate orthopedics input.  History of CVA - very mild, if any left-sided weakness. Stable. PT consult after surgery.  Leukocytosis - Might be reactive, patient denies any respiratory or urinary symptoms. - check a UA  Code Status: presumed full  Family Communication: son bedside  Disposition Plan: inpatient, OR in am  Time spent: 76  Icelynn Onken M. Elvera Lennox, MD [facility] Pager [phone]  If 7PM-7AM, please contact night-coverage [email or web address] Password [email or web address] 05/23/2013, 1:12 PM

## 2013-05-23 NOTE — ED Notes (Signed)
[facility] called for transport. 

## 2013-05-24 ENCOUNTER — Encounter (HOSPITAL_COMMUNITY): Payer: Self-pay | Admitting: Anesthesiology

## 2013-05-24 ENCOUNTER — Encounter (HOSPITAL_COMMUNITY)
Admission: EM | Disposition: A | Discharge status: Skilled Nursing Facility | Payer: Self-pay | Source: Home / Self Care | Attending: Internal Medicine

## 2013-05-24 ENCOUNTER — Inpatient Hospital Stay: Admit: 2013-05-24 | Payer: Self-pay | Admitting: Orthopedic Surgery

## 2013-05-24 ENCOUNTER — Inpatient Hospital Stay (HOSPITAL_COMMUNITY): Payer: Medicare HMO | Admitting: Anesthesiology

## 2013-05-24 ENCOUNTER — Inpatient Hospital Stay (HOSPITAL_COMMUNITY): Payer: Medicare HMO

## 2013-05-24 DIAGNOSIS — D72829 Elevated white blood cell count, unspecified: Secondary | ICD-10-CM

## 2013-05-24 HISTORY — PX: HIP ARTHROPLASTY: SHX981

## 2013-05-24 LAB — BASIC METABOLIC PANEL
BUN: 18 mg/dL (ref 6–23)
CO2: 26 mEq/L (ref 19–32)
Chloride: 100 mEq/L (ref 96–112)
Creatinine, Ser: 1 mg/dL (ref 0.50–1.10)
Glucose, Bld: 146 mg/dL — ABNORMAL HIGH (ref 70–99)
Potassium: 3.5 mEq/L (ref 3.5–5.1)

## 2013-05-24 LAB — CBC
HCT: 32.6 % — ABNORMAL LOW (ref 36.0–46.0)
HCT: 29.9 % — ABNORMAL LOW (ref 36.0–46.0)
Hemoglobin: 10.6 g/dL — ABNORMAL LOW (ref 12.0–15.0)
Hemoglobin: 10 g/dL — ABNORMAL LOW (ref 12.0–15.0)
MCH: 30.3 pg (ref 26.0–34.0)
MCHC: 33.4 g/dL (ref 30.0–36.0)
MCV: 90.8 fL (ref 78.0–100.0)
RBC: 3.59 MIL/uL — ABNORMAL LOW (ref 3.87–5.11)
RDW: 14 % (ref 11.5–15.5)
WBC: 18.8 10*3/uL — ABNORMAL HIGH (ref 4.0–10.5)

## 2013-05-24 LAB — CREATININE, SERUM
Creatinine, Ser: 0.88 mg/dL (ref 0.50–1.10)
GFR calc non Af Amer: 57 mL/min — ABNORMAL LOW (ref >90)

## 2013-05-24 SURGERY — HEMIARTHROPLASTY, HIP, DIRECT ANTERIOR APPROACH, FOR FRACTURE
Anesthesia: General | Site: Hip | Laterality: Left | Wound class: Clean

## 2013-05-24 MED ORDER — ROCURONIUM BROMIDE 100 MG/10ML IV SOLN
INTRAVENOUS | Status: DC | PRN
  Administered 2013-05-24: 30 mg via INTRAVENOUS

## 2013-05-24 MED ORDER — POLYETHYLENE GLYCOL 3350 17 G PO PACK
17.0000 g | PACK | Freq: Every day | ORAL | Status: DC | PRN
  Administered 2013-05-24: 17 g via ORAL
  Filled 2013-05-24: qty 1

## 2013-05-24 MED ORDER — FENTANYL CITRATE 0.05 MG/ML IJ SOLN
25.0000 ug | INTRAMUSCULAR | Status: DC | PRN
  Administered 2013-05-24: 25 ug via INTRAVENOUS

## 2013-05-24 MED ORDER — ACETAMINOPHEN 650 MG RE SUPP: 650.0000 mg | Freq: Four times a day (QID) | RECTAL | Status: DC | PRN

## 2013-05-24 MED ORDER — MENTHOL 3 MG MT LOZG: 1.0000 | LOZENGE | OROMUCOSAL | Status: DC | PRN

## 2013-05-24 MED ORDER — ONDANSETRON HCL 4 MG PO TABS: 4.0000 mg | ORAL_TABLET | Freq: Four times a day (QID) | ORAL | Status: DC | PRN

## 2013-05-24 MED ORDER — BUPIVACAINE HCL (PF) 0.25 % IJ SOLN
INTRAMUSCULAR | Status: AC
  Filled 2013-05-24: qty 30

## 2013-05-24 MED ORDER — BUPIVACAINE HCL (PF) 0.25 % IJ SOLN
INTRAMUSCULAR | Status: DC | PRN
  Administered 2013-05-24: 20 mL

## 2013-05-24 MED ORDER — SODIUM CHLORIDE 0.9 % IR SOLN
Status: DC | PRN
  Administered 2013-05-24: 1000 mL

## 2013-05-24 MED ORDER — ENOXAPARIN SODIUM 40 MG/0.4ML ~~LOC~~ SOLN
40.0000 mg | SUBCUTANEOUS | Status: DC
  Administered 2013-05-25 – 2013-05-29 (×5): 40 mg via SUBCUTANEOUS
  Filled 2013-05-24 (×7): qty 0.4

## 2013-05-24 MED ORDER — POTASSIUM CHLORIDE IN NACL 20-0.45 MEQ/L-% IV SOLN
INTRAVENOUS | Status: DC
  Administered 2013-05-24: 75 mL/h via INTRAVENOUS
  Administered 2013-05-25: 14:00:00 via INTRAVENOUS
  Filled 2013-05-24 (×5): qty 1000

## 2013-05-24 MED ORDER — PHENYLEPHRINE HCL 10 MG/ML IJ SOLN
INTRAMUSCULAR | Status: DC | PRN
  Administered 2013-05-24 (×2): 80 ug via INTRAVENOUS
  Administered 2013-05-24: 120 ug via INTRAVENOUS
  Administered 2013-05-24 (×2): 40 ug via INTRAVENOUS
  Administered 2013-05-24 (×2): 80 ug via INTRAVENOUS

## 2013-05-24 MED ORDER — NEOSTIGMINE METHYLSULFATE 1 MG/ML IJ SOLN
INTRAMUSCULAR | Status: DC | PRN
  Administered 2013-05-24: 2.5 mg via INTRAVENOUS

## 2013-05-24 MED ORDER — PROPOFOL 10 MG/ML IV BOLUS
INTRAVENOUS | Status: DC | PRN
  Administered 2013-05-24: 60 mg via INTRAVENOUS

## 2013-05-24 MED ORDER — FLEET ENEMA 7-19 GM/118ML RE ENEM: 1.0000 | ENEMA | Freq: Once | RECTAL | Status: AC | PRN

## 2013-05-24 MED ORDER — MORPHINE SULFATE 2 MG/ML IJ SOLN: 0.5000 mg | INTRAMUSCULAR | Status: DC | PRN

## 2013-05-24 MED ORDER — ONDANSETRON HCL 4 MG/2ML IJ SOLN: 4.0000 mg | Freq: Four times a day (QID) | INTRAMUSCULAR | Status: DC | PRN

## 2013-05-24 MED ORDER — ENOXAPARIN SODIUM 40 MG/0.4ML ~~LOC~~ SOLN: 40.0000 mg | SUBCUTANEOUS | Status: DC

## 2013-05-24 MED ORDER — VANCOMYCIN HCL IN DEXTROSE 1-5 GM/200ML-% IV SOLN
1000.0000 mg | Freq: Two times a day (BID) | INTRAVENOUS | Status: AC
  Administered 2013-05-24: 1000 mg via INTRAVENOUS
  Filled 2013-05-24: qty 200

## 2013-05-24 MED ORDER — ALUM & MAG HYDROXIDE-SIMETH 200-200-20 MG/5ML PO SUSP: 30.0000 mL | ORAL | Status: DC | PRN

## 2013-05-24 MED ORDER — FENTANYL CITRATE 0.05 MG/ML IJ SOLN
INTRAMUSCULAR | Status: DC | PRN
  Administered 2013-05-24 (×5): 50 ug via INTRAVENOUS

## 2013-05-24 MED ORDER — DEXTROSE-NACL 5-0.45 % IV SOLN
INTRAVENOUS | Status: DC
  Administered 2013-05-24: 01:00:00 via INTRAVENOUS

## 2013-05-24 MED ORDER — GLYCOPYRROLATE 0.2 MG/ML IJ SOLN
INTRAMUSCULAR | Status: DC | PRN
  Administered 2013-05-24: .5 mg via INTRAVENOUS

## 2013-05-24 MED ORDER — PROMETHAZINE HCL 25 MG/ML IJ SOLN
INTRAMUSCULAR | Status: AC
  Administered 2013-05-24: 16:00:00
  Filled 2013-05-24: qty 1

## 2013-05-24 MED ORDER — METOCLOPRAMIDE HCL 5 MG/ML IJ SOLN: 5.0000 mg | Freq: Three times a day (TID) | INTRAMUSCULAR | Status: DC | PRN

## 2013-05-24 MED ORDER — LIDOCAINE HCL (CARDIAC) 20 MG/ML IV SOLN
INTRAVENOUS | Status: DC | PRN
  Administered 2013-05-24: 40 mg via INTRAVENOUS

## 2013-05-24 MED ORDER — VANCOMYCIN HCL IN DEXTROSE 1-5 GM/200ML-% IV SOLN
1000.0000 mg | Freq: Once | INTRAVENOUS | Status: AC
  Administered 2013-05-24: 1000 mg via INTRAVENOUS
  Filled 2013-05-24: qty 200

## 2013-05-24 MED ORDER — DOCUSATE SODIUM 100 MG PO CAPS
100.0000 mg | ORAL_CAPSULE | Freq: Two times a day (BID) | ORAL | Status: DC
  Administered 2013-05-24 – 2013-05-29 (×10): 100 mg via ORAL
  Filled 2013-05-24 (×10): qty 1

## 2013-05-24 MED ORDER — FENTANYL CITRATE 0.05 MG/ML IJ SOLN
INTRAMUSCULAR | Status: AC
  Administered 2013-05-24: 17:00:00
  Filled 2013-05-24: qty 2

## 2013-05-24 MED ORDER — CEFAZOLIN SODIUM-DEXTROSE 2-3 GM-% IV SOLR
INTRAVENOUS | Status: AC
  Filled 2013-05-24: qty 50

## 2013-05-24 MED ORDER — PHENOL 1.4 % MT LIQD: 1.0000 | OROMUCOSAL | Status: DC | PRN

## 2013-05-24 MED ORDER — LACTATED RINGERS IV SOLN
INTRAVENOUS | Status: DC | PRN
  Administered 2013-05-24: 12:00:00 via INTRAVENOUS

## 2013-05-24 MED ORDER — ONDANSETRON HCL 4 MG/2ML IJ SOLN
INTRAMUSCULAR | Status: DC | PRN
  Administered 2013-05-24: 4 mg via INTRAVENOUS

## 2013-05-24 MED ORDER — BISACODYL 10 MG RE SUPP: 10.0000 mg | Freq: Every day | RECTAL | Status: DC | PRN

## 2013-05-24 MED ORDER — SENNA 8.6 MG PO TABS
1.0000 | ORAL_TABLET | Freq: Two times a day (BID) | ORAL | Status: DC
  Administered 2013-05-24 – 2013-05-29 (×10): 8.6 mg via ORAL
  Filled 2013-05-24 (×11): qty 1

## 2013-05-24 MED ORDER — SENNA-DOCUSATE SODIUM 8.6-50 MG PO TABS: 2.0000 | ORAL_TABLET | Freq: Every day | ORAL | Status: DC

## 2013-05-24 MED ORDER — HYDROCODONE-ACETAMINOPHEN 5-325 MG PO TABS
1.0000 | ORAL_TABLET | Freq: Four times a day (QID) | ORAL | Status: DC | PRN
  Administered 2013-05-25 – 2013-05-28 (×3): 1 via ORAL
  Filled 2013-05-24 (×3): qty 1

## 2013-05-24 MED ORDER — LIDOCAINE HCL 4 % MT SOLN
OROMUCOSAL | Status: DC | PRN
  Administered 2013-05-24: 4 mL via TOPICAL

## 2013-05-24 MED ORDER — ALBUMIN HUMAN 5 % IV SOLN
INTRAVENOUS | Status: DC | PRN
  Administered 2013-05-24: 14:00:00 via INTRAVENOUS

## 2013-05-24 MED ORDER — ACETAMINOPHEN 325 MG PO TABS
650.0000 mg | ORAL_TABLET | Freq: Four times a day (QID) | ORAL | Status: DC | PRN
  Administered 2013-05-25 – 2013-05-29 (×7): 650 mg via ORAL
  Filled 2013-05-24: qty 1
  Filled 2013-05-24 (×7): qty 2

## 2013-05-24 MED ORDER — HYDROCODONE-ACETAMINOPHEN 5-325 MG PO TABS: 1.0000 | ORAL_TABLET | Freq: Four times a day (QID) | ORAL | Status: DC | PRN

## 2013-05-24 MED ORDER — METOCLOPRAMIDE HCL 5 MG PO TABS
5.0000 mg | ORAL_TABLET | Freq: Three times a day (TID) | ORAL | Status: DC | PRN
  Filled 2013-05-24: qty 2

## 2013-05-24 SURGICAL SUPPLY — 64 items
APL SKNCLS STERI-STRIP NONHPOA (GAUZE/BANDAGES/DRESSINGS) ×1
BENZOIN TINCTURE PRP APPL 2/3 (GAUZE/BANDAGES/DRESSINGS) ×2 IMPLANT
BLADE SAW SGTL 18.5X63.X.64 HD (BLADE) ×2 IMPLANT
BRUSH FEMORAL CANAL (MISCELLANEOUS) ×1 IMPLANT
CAPT HIP FX BIPOLAR/UNIPOLAR ×1 IMPLANT
CEMENT BONE DEPUY (Cement) ×2 IMPLANT
CEMENT RESTRICTOR DEPUY SZ 3 (Cement) ×1 IMPLANT
CLOTH BEACON ORANGE TIMEOUT ST (SAFETY) ×2 IMPLANT
CLSR STERI-STRIP ANTIMIC 1/2X4 (GAUZE/BANDAGES/DRESSINGS) ×1 IMPLANT
COVER BACK TABLE 24X17X13 BIG (DRAPES) IMPLANT
COVER SURGICAL LIGHT HANDLE (MISCELLANEOUS) ×2 IMPLANT
DRAPE INCISE IOBAN 66X45 STRL (DRAPES) IMPLANT
DRAPE ORTHO SPLIT 77X108 STRL (DRAPES) ×4
DRAPE SURG ORHT 6 SPLT 77X108 (DRAPES) ×2 IMPLANT
DRAPE U-SHAPE 47X51 STRL (DRAPES) ×2 IMPLANT
DRILL BIT 5/64 (BIT) ×2 IMPLANT
DRSG MEPILEX BORDER 4X12 (GAUZE/BANDAGES/DRESSINGS) IMPLANT
DRSG MEPILEX BORDER 4X8 (GAUZE/BANDAGES/DRESSINGS) ×1 IMPLANT
DURAPREP 26ML APPLICATOR (WOUND CARE) ×2 IMPLANT
ELECT BLADE 6.5 EXT (BLADE) IMPLANT
ELECT CAUTERY BLADE 6.4 (BLADE) ×2 IMPLANT
ELECT REM PT RETURN 9FT ADLT (ELECTROSURGICAL) ×2
ELECTRODE REM PT RTRN 9FT ADLT (ELECTROSURGICAL) ×1 IMPLANT
FACESHIELD LNG OPTICON STERILE (SAFETY) ×4 IMPLANT
GLOVE BIO SURGEON STRL SZ8.5 (GLOVE) ×1 IMPLANT
GLOVE BIOGEL PI IND STRL 6.5 (GLOVE) IMPLANT
GLOVE BIOGEL PI IND STRL 8 (GLOVE) IMPLANT
GLOVE BIOGEL PI INDICATOR 6.5 (GLOVE) ×1
GLOVE BIOGEL PI INDICATOR 8 (GLOVE) ×2
GLOVE BIOGEL PI ORTHO PRO SZ8 (GLOVE) ×1
GLOVE ORTHO TXT STRL SZ7.5 (GLOVE) ×2 IMPLANT
GLOVE PI ORTHO PRO STRL SZ8 (GLOVE) ×1 IMPLANT
GLOVE SURG ORTHO 8.0 STRL STRW (GLOVE) ×4 IMPLANT
GLOVE SURG SS PI 8.0 STRL IVOR (GLOVE) ×1 IMPLANT
GOWN STRL NON-REIN LRG LVL3 (GOWN DISPOSABLE) ×4 IMPLANT
GOWN STRL REIN 2XL XLG LVL4 (GOWN DISPOSABLE) ×3 IMPLANT
HANDPIECE INTERPULSE COAX TIP (DISPOSABLE) ×2
KIT BASIN OR (CUSTOM PROCEDURE TRAY) ×2 IMPLANT
KIT ROOM TURNOVER OR (KITS) ×2 IMPLANT
MANIFOLD NEPTUNE II (INSTRUMENTS) ×2 IMPLANT
NEEDLE 22X1 1/2 (OR ONLY) (NEEDLE) ×2 IMPLANT
NS IRRIG 1000ML POUR BTL (IV SOLUTION) ×2 IMPLANT
PACK TOTAL JOINT (CUSTOM PROCEDURE TRAY) ×2 IMPLANT
PAD ARMBOARD 7.5X6 YLW CONV (MISCELLANEOUS) ×4 IMPLANT
PASSER SUT SWANSON 36MM LOOP (INSTRUMENTS) ×2 IMPLANT
PILLOW ABDUCTION HIP (SOFTGOODS) ×2 IMPLANT
PRESSURIZER FEMORAL UNIV (MISCELLANEOUS) ×1 IMPLANT
RETRIEVER SUT HEWSON (MISCELLANEOUS) ×2 IMPLANT
SET HNDPC FAN SPRY TIP SCT (DISPOSABLE) IMPLANT
STRIP CLOSURE SKIN 1/2X4 (GAUZE/BANDAGES/DRESSINGS) ×2 IMPLANT
SUT FIBERWIRE #2 38 REV NDL BL (SUTURE) ×4
SUT MNCRL AB 4-0 PS2 18 (SUTURE) ×2 IMPLANT
SUT VIC AB 0 CT1 27 (SUTURE) ×2
SUT VIC AB 0 CT1 27XBRD ANBCTR (SUTURE) ×1 IMPLANT
SUT VIC AB 1 CT1 27 (SUTURE) ×4
SUT VIC AB 1 CT1 27XBRD ANBCTR (SUTURE) ×2 IMPLANT
SUT VIC AB 3-0 SH 8-18 (SUTURE) ×2 IMPLANT
SUTURE FIBERWR#2 38 REV NDL BL (SUTURE) ×2 IMPLANT
SYR CONTROL 10ML LL (SYRINGE) ×2 IMPLANT
TOWEL OR 17X24 6PK STRL BLUE (TOWEL DISPOSABLE) ×2 IMPLANT
TOWEL OR 17X26 10 PK STRL BLUE (TOWEL DISPOSABLE) ×2 IMPLANT
TOWER CARTRIDGE SMART MIX (DISPOSABLE) ×1 IMPLANT
TRAY FOLEY CATH 16FRSI W/METER (SET/KITS/TRAYS/PACK) ×1 IMPLANT
WATER STERILE IRR 1000ML POUR (IV SOLUTION) ×8 IMPLANT

## 2013-05-24 NOTE — Preoperative (Signed)
Beta Blockers   Reason not to administer Beta Blockers:Not Applicable 

## 2013-05-24 NOTE — Progress Notes (Signed)
Utilization review complete. Hazeline Charnley RN CCM Case Mgmt phone [phone] 

## 2013-05-24 NOTE — Progress Notes (Signed)
Patient ID: Lindsey Hensley  female  MWN:[id number]    DOB: June 08, 1922    DOA: 05/23/2013  PCP: Kari Baars, MD  Assessment/Plan: Active Problems:   Hip fracture, left   History of CVA (cerebrovascular accident)  Hip fracture - status post ground-level fall, without loss of consciousness.  - Patient seen earlier this morning, awaiting surgery, n.p.o. - Ortho on board  History of CVA - very mild, if any left-sided weakness. Stable.  - PT consult after surgery.   Leukocytosis - trending up, afebrile - UA negative for any UTI, chest x-ray negative for any pneumonia - Ordered 2 sets of blood cultures today   DVT Prophylaxis:per  Orthopedic surgery  Code Status:  Disposition:  Possibly will need SNF for rehabilitation after the surgery, discussed with family (son and daughterin law are at the bedside), ok with it   Subjective: Patient denies any complaints, awaiting surgery at the time of my encounter  Objective: Weight change:   Intake/Output Summary (Last 24 hours) at 05/24/13 1546 Last data filed at 05/24/13 1523  Gross per 24 hour  Intake   2490 ml  Output   1050 ml  Net   1440 ml   Blood pressure 150/88, pulse 74, temperature 97.3 F (36.3 C), temperature source Oral, resp. rate 9, weight 55.339 kg (122 lb), SpO2 100.00%.  Physical Exam: General: Alert and awake, oriented x3, not in any acute distress. CVS: S1-S2 clear, no murmur rubs or gallops Chest: clear to auscultation bilaterally, no wheezing, rales or rhonchi Abdomen: soft nontender, nondistended, normal bowel sounds  Extremities: no cyanosis, clubbing or edema noted bilaterally   Lab Results: Basic Metabolic Panel:  Recent Labs Lab 05/23/13 1130 05/24/13 0605  NA 137 135  K 3.7 3.5  CL 101 100  CO2 27 26  GLUCOSE 107* 146*  BUN 20 18  CREATININE 0.99 1.00  CALCIUM 9.0 8.6   Liver Function Tests: No results found for this basename: AST, ALT, ALKPHOS, BILITOT, PROT, ALBUMIN,  in the last 168  hours No results found for this basename: LIPASE, AMYLASE,  in the last 168 hours No results found for this basename: AMMONIA,  in the last 168 hours CBC:  Recent Labs Lab 05/23/13 1130 05/24/13 0605  WBC 15.0* 18.8*  NEUTROABS 9.4*  --   HGB 10.8* 10.6*  HCT 33.5* 32.6*  MCV 91.5 90.8  PLT 153 129*   Cardiac Enzymes:  Recent Labs Lab 05/23/13 1130  TROPONINI <0.30   BNP: No components found with this basename: POCBNP,  CBG: No results found for this basename: GLUCAP,  in the last 168 hours   Micro Results: Recent Results (from the past 240 hour(s))  SURGICAL PCR SCREEN     Status: Abnormal   Collection Time    05/24/13  6:35 AM      Result Value Range Status   MRSA, PCR POSITIVE (*) NEGATIVE Final   Staphylococcus aureus POSITIVE (*) NEGATIVE Final   Comment:            The Xpert SA Assay (FDA     approved for NASAL specimens     in patients over 22 years of age),     is one component of     a comprehensive surveillance     program.  Test performance has     been validated by [employer] for patients greater     than or equal to 59 year old.     It  is not intended     to diagnose infection nor to     guide or monitor treatment.    Studies/Results: Dg Hip Complete Left  05/23/2013   *RADIOLOGY REPORT*  Clinical Data: Fall with left hip pain.  LEFT HIP - COMPLETE 2+ VIEW  Comparison: None.  Findings: There is a displaced fracture of the left femoral neck with superior displacement of the distal fracture fragment. Femoral head remains located in its acetabulum.  Right hip is unremarkable.  IMPRESSION: Left femoral neck fracture.   Original Report Authenticated By: Leanna Battles, M.D.   Dg Chest Port 1 View  05/23/2013   *RADIOLOGY REPORT*  Clinical Data: Fall, weakness  PORTABLE CHEST - 1 VIEW  Comparison: Feb 25, 2011.  Findings: Cardiomediastinal silhouette appears normal.  No acute pulmonary disease is noted.  No pleural effusion or pneumothorax is  noted.  Bony thorax is intact.  IMPRESSION: No acute cardiopulmonary abnormality seen.   Original Report Authenticated By: Lupita Raider.,  M.D.    Medications: Scheduled Meds: . promethazine          LOS: 1 day   Genieve Ramaswamy M.D. [facility] 05/24/2013, 3:46 PM Pager: [phone]  If 7PM-7AM, please contact night-coverage [email or web address] Password [email or web address]

## 2013-05-24 NOTE — Progress Notes (Signed)
Orthopedic Tech Progress Note Patient Details:  Lindsey Hensley [birth year] [id number] Applied overhead frame to bed     [name] 05/24/2013, 6:50 PM

## 2013-05-24 NOTE — Anesthesia Preprocedure Evaluation (Addendum)
Anesthesia Evaluation  Patient identified by MRN, date of birth, ID band Patient awake    Reviewed: Allergy & Precautions, H&P , NPO status , Patient's Chart, lab work & pertinent test results  Airway Mallampati: II TM Distance: >3 FB Neck ROM: Full    Dental no notable dental hx. (+) Edentulous Upper, Partial Lower and Dental Advisory Given   Pulmonary neg pulmonary ROS,  breath sounds clear to auscultation  Pulmonary exam normal       Cardiovascular negative cardio ROS  Rhythm:Regular Rate:Normal     Neuro/Psych CVA, Residual Symptoms negative neurological ROS  negative psych ROS   GI/Hepatic Neg liver ROS, GERD-  Controlled,  Endo/Other  negative endocrine ROS  Renal/GU negative Renal ROS  negative genitourinary   Musculoskeletal  (+) Arthritis -, Osteoarthritis,    Abdominal   Peds  Hematology negative hematology ROS (+)   Anesthesia Other Findings   Reproductive/Obstetrics negative OB ROS                        Anesthesia Physical Anesthesia Plan  ASA: II  Anesthesia Plan: General   Post-op Pain Management:    Induction: Intravenous  Airway Management Planned: Oral ETT  Additional Equipment:   Intra-op Plan:   Post-operative Plan: Extubation in OR  Informed Consent: I have reviewed the patients History and Physical, chart, labs and discussed the procedure including the risks, benefits and alternatives for the proposed anesthesia with the patient or authorized representative who has indicated his/her understanding and acceptance.   Dental advisory given  Plan Discussed with: CRNA  Anesthesia Plan Comments:         Anesthesia Quick Evaluation

## 2013-05-24 NOTE — Transfer of Care (Signed)
Immediate Anesthesia Transfer of Care Note  Patient: Lindsey Hensley  Procedure(s) Performed: Procedure(s): ARTHROPLASTY UNIPOLAR HIP (Left)  Patient Location: PACU  Anesthesia Type:General  Level of Consciousness: awake and alert   Airway & Oxygen Therapy: Patient Spontanous Breathing and Patient connected to face mask oxygen  Post-op Assessment: Report given to PACU RN, Post -op Vital signs reviewed and stable and Patient moving all extremities X 4  Post vital signs: Reviewed and stable  Complications: No apparent anesthesia complications

## 2013-05-24 NOTE — Anesthesia Postprocedure Evaluation (Signed)
  Anesthesia Post-op Note  Patient: Lindsey Hensley  Procedure(s) Performed: Procedure(s): ARTHROPLASTY UNIPOLAR HIP (Left)  Patient Location: PACU  Anesthesia Type:General  Level of Consciousness: confused  Airway and Oxygen Therapy: Patient Spontanous Breathing and Patient connected to nasal cannula oxygen  Post-op Pain: none  Post-op Assessment: Post-op Vital signs reviewed, Patient's Cardiovascular Status Stable, Respiratory Function Stable, Patent Airway and No signs of Nausea or vomiting  Post-op Vital Signs: Reviewed and stable  Complications: No apparent anesthesia complications

## 2013-05-24 NOTE — Anesthesia Procedure Notes (Signed)
Procedure Name: Intubation Date/Time: 05/24/2013 12:52 PM Performed by: Sharlene Dory E Pre-anesthesia Checklist: Patient identified, Emergency Drugs available, Suction available, Patient being monitored and Timeout performed Patient Re-evaluated:Patient Re-evaluated prior to inductionOxygen Delivery Method: Circle system utilized Preoxygenation: Pre-oxygenation with 100% oxygen Intubation Type: IV induction Ventilation: Mask ventilation without difficulty Laryngoscope Size: Mac and 3 Grade View: Grade I Tube type: Oral Tube size: 7.0 mm Number of attempts: 1 Airway Equipment and Method: Stylet and LTA kit utilized Placement Confirmation: ETT inserted through vocal cords under direct vision,  positive ETCO2 and breath sounds checked- equal and bilateral Secured at: 21 cm Tube secured with: Tape Dental Injury: Teeth and Oropharynx as per pre-operative assessment

## 2013-05-24 NOTE — Progress Notes (Signed)
The patient has been re-examined, and the chart reviewed, and there have been no interval changes to the documented history and physical.    The risks, benefits, and alternatives have been discussed at length, and the patient is willing to proceed.    Laketa Sandoz P, MD  

## 2013-05-24 NOTE — Op Note (Signed)
05/23/2013 - 05/24/2013  2:33 PM  PATIENT:  Lindsey Hensley   MRN: [id number]  PRE-OPERATIVE DIAGNOSIS:  Left hip femoral neck fx  POST-OPERATIVE DIAGNOSIS:  Left Displaced Femoral neck fracture-Hip  PROCEDURE:  Procedure(s): ARTHROPLASTY UNIPOLAR HIP  PREOPERATIVE INDICATIONS:  Lindsey Hensley is an 79 y.o. female who was admitted 05/23/2013 with a diagnosis of left femoral neck fracture and elected for surgical management.  The risks benefits and alternatives were discussed with the patient including but not limited to the risks of nonoperative treatment, versus surgical intervention including infection, bleeding, nerve injury, periprosthetic fracture, the need for revision surgery, dislocation, leg length discrepancy, blood clots, cardiopulmonary complications, morbidity, mortality, among others, and they were willing to proceed.  Predicted outcome is good, although there will be at least a six to nine month expected recovery.   OPERATIVE REPORT     SURGEON:  Teryl Lucy, MD    ASSISTANT:  Janace Litten, OPA-C  (Present throughout the entire procedure,  necessary for completion of procedure in a timely manner, assisting with retraction, instrumentation, and closure)     ANESTHESIA:  General    COMPLICATIONS:  None.      COMPONENTS:  Depuy [place] Basic Femoral Fracture stem size 5, with a stem centralizer, a cement restrictor, with a 0 spacer and a size 48 fracture head unipolar hip ball, and a total of 2 bags of Depuy CMW 1 Bone Cement.    PROCEDURE IN DETAIL: The patient was met in the holding area and identified.  The appropriate hip  was marked at the operative site. The patient was then transported to the OR and  placed under general anesthesia.  At that point, the patient was  placed in the lateral decubitus position with the operative side up and  secured to the operating room table and all bony prominences padded.     The operative lower extremity was prepped from the iliac crest  to the toes.  Sterile draping was performed.  Time out was performed prior to incision.      A routine posterolateral approach was utilized via sharp dissection  carried down to the subcutaneous tissue.  Gross bleeders were Bovie  coagulated.  The iliotibial band was identified and incised  along the length of the skin incision.  Self-retaining retractors were  inserted.  With the hip internally rotated, the short external rotators  were identified. The piriformis was tagged with FiberWire, and the hip capsule released in a T-type fashion.  The femoral neck was exposed, and I resected the femoral neck using the appropriate jig. This was performed at approximately a thumb's breadth above the lesser trochanter.    I then exposed the deep acetabulum, cleared out any tissue including the ligamentum teres, and included the hip capsule in the FiberWire used above and below the T.    I then prepared the proximal femur using the cookie-cutter, the lateralizing reamer, and then sequentially broached.  A trial utilized, and I reduced the hip and it was found to have excellent stability with functional range of motion. The trial components were then removed.   I then placed a cement restrictor, and cemented the real components in place. All excess cement was removed. Once the cement had cured, I impacted the real head ball into place. The hip was then reduced and taken through functional range of motion and found to have excellent stability. Leg lengths were restored.  I then used a 2 mm drill bits to pass the  FiberWire suture from the capsule and puriform is through the greater trochanter, and secured this. Excellent posterior capsular repair was achieved. I also closed the T in the capsule.  I then irrigated the hip copiously again with pulse lavage, and repaired the fascia with Vicryl, followed by Vicryl for the subcutaneous tissue, Monocryl for the skin, Steri-Strips and sterile gauze. The wounds were  injected. The patient was then awakened and returned to PACU in stable and satisfactory condition. The sciatic nerve was palpated at the completion of the case and found to be intact. There were no complications.  Teryl Lucy, MD Orthopedic Surgeon [phone]   05/24/2013 2:33 PM

## 2013-05-25 LAB — BASIC METABOLIC PANEL
CO2: 23 mEq/L (ref 19–32)
Chloride: 100 mEq/L (ref 96–112)
Creatinine, Ser: 1.1 mg/dL (ref 0.50–1.10)
Potassium: 3.6 mEq/L (ref 3.5–5.1)

## 2013-05-25 LAB — CBC
MCV: 90.4 fL (ref 78.0–100.0)
Platelets: 107 10*3/uL — ABNORMAL LOW (ref 150–400)
RBC: 2.82 MIL/uL — ABNORMAL LOW (ref 3.87–5.11)
WBC: 17.9 10*3/uL — ABNORMAL HIGH (ref 4.0–10.5)

## 2013-05-25 NOTE — Progress Notes (Signed)
Patient ID: Lindsey Hensley  female  ZOX:[id number]    DOB: July 29, 1922    DOA: 05/23/2013  PCP: Kari Baars, MD  Assessment/Plan: Active Problems:   Hip fracture, left   History of CVA (cerebrovascular accident)   Orthostatic hypotension during physical therapy - Gave fluid bolus, closing monitor - Monitor CBC hemoglobin 8.6 today  Hip fracture - status post ground-level fall, without loss of consciousness.  - Patient seen earlier this morning, awaiting surgery, n.p.o. - Ortho on board  History of CVA - very mild, if any left-sided weakness. Stable.  - PT consult after surgery.   Leukocytosis - trending down today , afebrile - UA negative for any UTI, chest x-ray negative for any pneumonia - Blood cultures negative so far   Anemia: Likely dilutional and postop - Follow CBC closely, transfuse for hemoglobin less than 8   DVT Prophylaxis:per  Orthopedic surgery  Code Status:  Disposition: 24hr supervision/SNF    Subjective: At time of my encounter, patient was dizzy and orthostatic was working with physical therapy before  Objective: Weight change:   Intake/Output Summary (Last 24 hours) at 05/25/13 1445 Last data filed at 05/25/13 0500  Gross per 24 hour  Intake    550 ml  Output    990 ml  Net   -440 ml   Blood pressure 111/42, pulse 81, temperature 98.6 F (37 C), temperature source Oral, resp. rate 16, height 5' 5.5" (1.664 m), weight 55.339 kg (122 lb), SpO2 97.00%.  Physical Exam: General: Alert and awake, oriented x3, not in any acute distress. CVS: S1-S2 clear, no murmur rubs or gallops Chest: clear to auscultation bilaterally, no wheezing, rales or rhonchi Abdomen: soft nontender, nondistended, normal bowel sounds  Extremities: no cyanosis, clubbing or edema noted bilaterally   Lab Results: Basic Metabolic Panel:  Recent Labs Lab 05/24/13 0605 05/24/13 1800 05/25/13 0600  NA 135  --  132*  K 3.5  --  3.6  CL 100  --  100  CO2 26  --  23   GLUCOSE 146*  --  114*  BUN 18  --  17  CREATININE 1.00 0.88 1.10  CALCIUM 8.6  --  8.2*   Liver Function Tests: No results found for this basename: AST, ALT, ALKPHOS, BILITOT, PROT, ALBUMIN,  in the last 168 hours No results found for this basename: LIPASE, AMYLASE,  in the last 168 hours No results found for this basename: AMMONIA,  in the last 168 hours CBC:  Recent Labs Lab 05/23/13 1130  05/24/13 1800 05/25/13 0600  WBC 15.0*  < > 19.9* 17.9*  NEUTROABS 9.4*  --   --   --   HGB 10.8*  < > 10.0* 8.6*  HCT 33.5*  < > 29.9* 25.5*  MCV 91.5  < > 90.6 90.4  PLT 153  < > 131* 107*  < > = values in this interval not displayed. Cardiac Enzymes:  Recent Labs Lab 05/23/13 1130  TROPONINI <0.30   BNP: No components found with this basename: POCBNP,  CBG: No results found for this basename: GLUCAP,  in the last 168 hours   Micro Results: Recent Results (from the past 240 hour(s))  SURGICAL PCR SCREEN     Status: Abnormal   Collection Time    05/24/13  6:35 AM      Result Value Range Status   MRSA, PCR POSITIVE (*) NEGATIVE Final   Staphylococcus aureus POSITIVE (*) NEGATIVE Final   Comment:  The Xpert SA Assay (FDA     approved for NASAL specimens     in patients over 78 years of age),     is one component of     a comprehensive surveillance     program.  Test performance has     been validated by [employer] for patients greater     than or equal to 61 year old.     It is not intended     to diagnose infection nor to     guide or monitor treatment.  CULTURE, BLOOD (ROUTINE X 2)     Status: None   Collection Time    05/24/13  9:50 AM      Result Value Range Status   Specimen Description BLOOD RIGHT ARM   Final   Special Requests BOTTLES DRAWN AEROBIC AND ANAEROBIC 10CC   Final   Culture  Setup Time     Final   Value: 05/24/2013 14:32     Performed at [facility]   Culture     Final   Value:        BLOOD CULTURE RECEIVED NO GROWTH TO  DATE CULTURE WILL BE HELD FOR 5 DAYS BEFORE ISSUING A FINAL NEGATIVE REPORT     Performed at [facility]   Report Status PENDING   Incomplete  CULTURE, BLOOD (ROUTINE X 2)     Status: None   Collection Time    05/24/13  9:55 AM      Result Value Range Status   Specimen Description BLOOD RIGHT HAND   Final   Special Requests BOTTLES DRAWN AEROBIC AND ANAEROBIC 10CC   Final   Culture  Setup Time     Final   Value: 05/24/2013 14:31     Performed at [facility]   Culture     Final   Value:        BLOOD CULTURE RECEIVED NO GROWTH TO DATE CULTURE WILL BE HELD FOR 5 DAYS BEFORE ISSUING A FINAL NEGATIVE REPORT     Performed at [facility]   Report Status PENDING   Incomplete    Studies/Results: Dg Hip Complete Left  05/23/2013   *RADIOLOGY REPORT*  Clinical Data: Fall with left hip pain.  LEFT HIP - COMPLETE 2+ VIEW  Comparison: None.  Findings: There is a displaced fracture of the left femoral neck with superior displacement of the distal fracture fragment. Femoral head remains located in its acetabulum.  Right hip is unremarkable.  IMPRESSION: Left femoral neck fracture.   Original Report Authenticated By: Leanna Battles, M.D.   Dg Chest Port 1 View  05/23/2013   *RADIOLOGY REPORT*  Clinical Data: Fall, weakness  PORTABLE CHEST - 1 VIEW  Comparison: Feb 25, 2011.  Findings: Cardiomediastinal silhouette appears normal.  No acute pulmonary disease is noted.  No pleural effusion or pneumothorax is noted.  Bony thorax is intact.  IMPRESSION: No acute cardiopulmonary abnormality seen.   Original Report Authenticated By: Lupita Raider.,  M.D.    Medications: Scheduled Meds: . docusate sodium  100 mg Oral BID  . enoxaparin (LOVENOX) injection  40 mg Subcutaneous Q24H  . senna  1 tablet Oral BID      LOS: 2 days   RAI,RIPUDEEP M.D. [facility] 05/25/2013, 2:45 PM Pager: [phone]  If 7PM-7AM, please contact night-coverage [email or web address] Password [email or web address]

## 2013-05-25 NOTE — Evaluation (Addendum)
Occupational Therapy Evaluation Patient Details Name: Lindsey Hensley MRN: [id number] DOB: Jul 07, 1922 Today's Date: 05/26/2013 Time:  -     OT Assessment / Plan / Recommendation History of present illness 79 y.o. female has a past medical history significant for previous CVA with mild left-sided weakness, was able to live independently and do all her ADLs, and no other major medical problems, comes in with a chief complaint of left hip pain after she fell while working in her garden last night. She felt fine afterwards, and was able to walk, however this morning she noticed increasing pain and she decided to get checked out in the emergency room. Hip x-ray showed left femoral neck fracture.  S/p L hip hemiarthroplasty   Clinical Impression   Patient is s/p L hip hemiarthroplasty surgery resulting in functional limitations due to the deficits listed below (see OT Problem List). Pt independent with ADLs, PTA. Patient will benefit from skilled OT to increase their independence and safety  to allow discharge to the venue listed below. Pt had near syncope episode during session.      OT Assessment  Patient needs continued OT Services    Follow Up Recommendations  SNF;Supervision/Assistance - 24 hour    Barriers to Discharge      Equipment Recommendations  Other (comment) (defer to snf)    Recommendations for Other Services    Frequency  Min 2X/week    Precautions / Restrictions Precautions Precautions: Posterior Hip;Fall Precaution Booklet Issued: Yes (comment) Precaution Comments: Reviewed posterior hip precautions. Required Braces or Orthoses:  (abduction pillow) Restrictions Weight Bearing Restrictions: Yes LLE Weight Bearing: Weight bearing as tolerated   Pertinent Vitals/Pain No pain reported during session. BP 75/39 sitting on BSC and then 111/42 after returning supine in bed. Nurse notified.     ADL  Eating/Feeding: Independent Where Assessed - Eating/Feeding: Bed  level Grooming: Set up;Supervision/safety Where Assessed - Grooming: Supine, head of bed up Upper Body Bathing: Minimal assistance Where Assessed - Upper Body Bathing: Supported sitting (sitting EOB) Lower Body Bathing: +1 Total assistance Where Assessed - Lower Body Bathing: Supported sit to stand Upper Body Dressing: Minimal assistance Where Assessed - Upper Body Dressing: Supported sitting (sitting EOB) Lower Body Dressing: +1 Total assistance Where Assessed - Lower Body Dressing: Supported sit to Pharmacist, hospital: Performed; +2 Total Assistance (50%) and (60%) for stand to sit transfer to [facility] Toilet Transfer Method: Stand pivot;Sit to Barista: Other (comment) (from elevated bed to [facility]) Toileting - Clothing Manipulation and Hygiene: +1 Total assistance Where Assessed - Toileting Clothing Manipulation and Hygiene: Sit on 3-in-1 or toilet Tub/Shower Transfer Method: Not assessed Equipment Used: Gait belt;Rolling walker Transfers/Ambulation Related to ADLs: +2 total A (50%) stand pivot transfer and sit to stand from elevated bed/ +2 Total A (60%) for stand to sit to [facility].  +2 total A (0%) for sit <> stand from [facility] due to syncope episode. ADL Comments: Pt had to urinate upon OT's arrival. Pt performed stand pivot transfer to [facility]. Pt close to having syncope episode on BSC due to decrease in BP requiring +2 total A to get back to bed.     OT Diagnosis: Other (comment);Acute pain (Posterior hip-decreased independence in ADLs)  OT Problem List: Decreased strength;Decreased activity tolerance;Impaired balance (sitting and/or standing);Decreased knowledge of use of DME or AE;Decreased knowledge of precautions OT Treatment Interventions: Self-care/ADL training;DME and/or AE instruction;Therapeutic activities;Patient/family education;Balance training   OT Goals(Current goals can be found in the care plan section)  Acute Rehab OT Goals Patient Stated Goal: not stated OT  Goal Formulation: With patient Time For Goal Achievement: 06/01/13 Potential to Achieve Goals: Good  Visit Information  Last OT Received On: 05/25/13 Assistance Needed: +2 History of Present Illness: 79 y.o. female has a past medical history significant for previous CVA with mild left-sided weakness, was able to live independently and do all her ADLs, and no other major medical problems, comes in with a chief complaint of left hip pain after she fell while working in her garden last night. She felt fine afterwards, and was able to walk, however this morning she noticed increasing pain and she decided to get checked out in the emergency room. Hip x-ray showed left femoral neck fracture.  S/p L hip hemiarthroplasty       Prior Functioning     Home Living Family/patient expects to be discharged to:: Skilled nursing facility Living Arrangements: Alone Available Help at Discharge: Family;Available PRN/intermittently (spoke with son and he said he can stay with her 24/7 if need) Type of Home: House Home Access: Stairs to enter [employer] of Steps: 4 and 1 at two different entrances Entrance Stairs-Rails: Right Prior Function Level of Independence: Independent Communication Communication: No difficulties         Vision/Perception     Cognition  Cognition Arousal/Alertness: Awake/alert Behavior During Therapy: WFL for tasks assessed/performed Overall Cognitive Status: Within Functional Limits for tasks assessed    Extremity/Trunk Assessment Upper Extremity Assessment Upper Extremity Assessment: Overall WFL for tasks assessed     Mobility Bed Mobility Bed Mobility: Supine to Sit;Sitting - Scoot to [place] of Bed;Sit to Supine Supine to Sit: 2: Max assist Sitting - Scoot to [place] of Bed: 2: Max assist Sit to Supine: 1: +2 Total assist;HOB flat Sit to Supine: Patient Percentage: 0% Details for Bed Mobility Assistance: Required +2 total A to return back to supine  position due to low BP.  Max A to assist LE's and trunk to upright position. Transfers Transfers: Sit to Stand;Stand to Sit Sit to Stand: 3: +2 Total assist;With upper extremity assist;From bed;1: +2 Total assist;From chair/3-in-1 Sit to Stand: Patient Percentage: 50% (0% on transfer back to bed from [facility]) Stand to Sit: 3: +2 Total assist;With upper extremity assist;To chair/3-in-1;1: +2 Total assist;To bed Stand to Sit: Patient Percentage: 60% (0% on transfer back to bed from [facility]) Details for Transfer Assistance:Pt became pale and almost passed out on [facility] requiring +2 total A to get back to bed.  +2 total A (50%) stand pivot transfer from bed to [facility]     Exercise     Balance     End of Session OT - End of Session Equipment Utilized During Treatment: Gait belt;Rolling walker;Oxygen Activity Tolerance: Other (comment) (near syncope episode-see BP above in pain/vitals) Patient left: in bed;with call bell/phone within reach;with family/visitor present Nurse Communication: Other (comment) (nurse aware of low BP)  GO     Earlie Raveling OTR/L [phone] 05/26/2013, 8:05 AM

## 2013-05-25 NOTE — Progress Notes (Signed)
    Subjective:  Patient reports pain as mild  Objective:   VITALS:   Filed Vitals:   05/25/13 0535 05/25/13 0821 05/25/13 0930 05/25/13 0934  BP: 100/37  75/39 111/42  Pulse: 81     Temp: 98.6 F (37 C)     TempSrc:      Resp: 16 16    Height:  5' 5.5" (1.664 m)    Weight:  55.339 kg (122 lb)    SpO2: 95% 94%      Physical Exam  Dressing: C/Lindsey Hensley/I  Compartments soft  Distally NVI  LABS  Results for orders placed during the hospital encounter of 05/23/13 (from the past 24 hour(s))  CBC     Status: Abnormal   Collection Time    05/24/13  6:00 PM      Result Value Range   WBC 19.9 (*) 4.0 - 10.5 K/uL   RBC 3.30 (*) 3.87 - 5.11 MIL/uL   Hemoglobin 10.0 (*) 12.0 - 15.0 g/dL   HCT 16.1 (*) 09.6 - 04.5 %   MCV 90.6  78.0 - 100.0 fL   MCH 30.3  26.0 - 34.0 pg   MCHC 33.4  30.0 - 36.0 g/dL   RDW 40.9  81.1 - 91.4 %   Platelets 131 (*) 150 - 400 K/uL  CREATININE, SERUM     Status: Abnormal   Collection Time    05/24/13  6:00 PM      Result Value Range   Creatinine, Ser 0.88  0.50 - 1.10 mg/dL   GFR calc non Af Amer 57 (*) >90 mL/min   GFR calc Af Amer 66 (*) >90 mL/min  CBC     Status: Abnormal   Collection Time    05/25/13  6:00 AM      Result Value Range   WBC 17.9 (*) 4.0 - 10.5 K/uL   RBC 2.82 (*) 3.87 - 5.11 MIL/uL   Hemoglobin 8.6 (*) 12.0 - 15.0 g/dL   HCT 78.2 (*) 95.6 - 21.3 %   MCV 90.4  78.0 - 100.0 fL   MCH 30.5  26.0 - 34.0 pg   MCHC 33.7  30.0 - 36.0 g/dL   RDW 08.6  57.8 - 46.9 %   Platelets 107 (*) 150 - 400 K/uL  BASIC METABOLIC PANEL     Status: Abnormal   Collection Time    05/25/13  6:00 AM      Result Value Range   Sodium 132 (*) 135 - 145 mEq/L   Potassium 3.6  3.5 - 5.1 mEq/L   Chloride 100  96 - 112 mEq/L   CO2 23  19 - 32 mEq/L   Glucose, Bld 114 (*) 70 - 99 mg/dL   BUN 17  6 - 23 mg/dL   Creatinine, Ser 6.29  0.50 - 1.10 mg/dL   Calcium 8.2 (*) 8.4 - 10.5 mg/dL   GFR calc non Af Amer 44 (*) >90 mL/min   GFR calc Af Amer 50 (*)  >90 mL/min     Assessment/Plan: 1 Day Post-Op   Active Problems:   Hip fracture, left   History of CVA (cerebrovascular accident)   PLAN: Weight Bearing: WBAT LLE Posterior hip precautions Dressings: reinforce as needed VTE prophylaxis: Lovenox and SCD's Dispo: SNF   Lindsey Hensley, Lindsey Hensley 05/25/2013, 9:42 AM   Lindsey Rana, MD Cell [phone]

## 2013-05-25 NOTE — Evaluation (Signed)
Physical Therapy Evaluation Patient Details Name: Lindsey Hensley MRN: [id number] DOB: 1921/11/23 Today's Date: 05/25/2013 Time: 2725-3664 PT Time Calculation (min): 32 min  PT Assessment / Plan / Recommendation History of Present Illness  79 y.o. female has a past medical history significant for previous CVA with mild left-sided weakness, was able to live independently and do all her ADLs, and no other major medical problems, comes in with a chief complaint of left hip pain after she fell while working in her garden last night. She felt fine afterwards, and was able to walk, however this morning she noticed increasing pain and she decided to get checked out in the emergency room. Hip x-ray showed left femoral neck fracture.  S/p L hip hemiarthroplasty  Clinical Impression  Patient is s/p L hip hemiarthroplasty surgery resulting in functional limitations due to the deficits listed below (see PT Problem List).  Patient will benefit from skilled PT to increase their independence and safety with mobility to allow discharge to the venue listed below.  Per OT, pt with near syncope episode up to [facility] this morning so checked BP prior to mobilizing (see below) and pt reported dizziness with sitting so returned to supine.      PT Assessment  Patient needs continued PT services    Follow Up Recommendations  Supervision/Assistance - 24 hour;SNF    Does the patient have the potential to tolerate intense rehabilitation      Barriers to Discharge        Equipment Recommendations  None recommended by PT    Recommendations for Other Services     Frequency Min 3X/week    Precautions / Restrictions Precautions Precautions: Posterior Hip;Fall Precaution Comments: Reviewed Posterior hip precautions and reason for abduction pillow Restrictions LLE Weight Bearing: Weight bearing as tolerated   Pertinent Vitals/Pain BP supine 111/59 BP sitting 130/100 and pt reported dizziness Pt returned to supine. Pt  reports L hip pain with movement but none at rest.     Mobility  Bed Mobility Bed Mobility: Supine to Sit;Sit to Supine;Scooting to [facility];Rolling Right Rolling Right: 3: Mod assist Supine to Sit: 3: Mod assist;HOB elevated;With rails Sit to Supine: 3: Mod assist;HOB elevated Scooting to [facility]: With trapeze;Other (comment);4: Min assist (trendelenberg) Details for Bed Mobility Assistance: verbal cues for technique and precautions, increased time due to pain, multimodal cues provided, pt reported slight dizziness upon sitting and then requested bed pan so returned to supine, pillow between knees for rolling with assist for lower body Transfers Transfers: Not assessed    Exercises General Exercises - Lower Extremity Ankle Circles/Pumps: AROM;Both;10 reps;Supine Quad Sets: AROM;Both;10 reps;Supine   PT Diagnosis: Difficulty walking;Acute pain  PT Problem List: Decreased strength;Decreased activity tolerance;Decreased mobility;Decreased knowledge of precautions;Decreased safety awareness;Decreased knowledge of use of DME;Pain;Decreased cognition PT Treatment Interventions: DME instruction;Gait training;Functional mobility training;Therapeutic activities;Therapeutic exercise;Patient/family education     PT Goals(Current goals can be found in the care plan section) Acute Rehab PT Goals PT Goal Formulation: With patient Time For Goal Achievement: 06/01/13 Potential to Achieve Goals: Good  Visit Information  Last PT Received On: 05/25/13 Assistance Needed: +2 History of Present Illness: 79 y.o. female has a past medical history significant for previous CVA with mild left-sided weakness, was able to live independently and do all her ADLs, and no other major medical problems, comes in with a chief complaint of left hip pain after she fell while working in her garden last night. She felt fine afterwards, and was able to walk, however  this morning she noticed increasing pain and she decided to get  checked out in the emergency room. Hip x-ray showed left femoral neck fracture.  S/p L hip hemiarthroplasty       Prior Functioning  Home Living Family/patient expects to be discharged to:: Skilled nursing facility Prior Function Level of Independence: Independent Communication Communication: No difficulties    Cognition  Cognition Arousal/Alertness: Awake/alert Behavior During Therapy: WFL for tasks assessed/performed Area of Impairment: Problem solving;Awareness Memory: Decreased recall of precautions;Decreased short-term memory Problem Solving: Slow processing;Requires verbal cues;Requires tactile cues General Comments: unable to recall precautions from OT this morning, used bed pan however asked for bed pan again and forgot she had already used it    Extremity/Trunk Assessment Lower Extremity Assessment Lower Extremity Assessment: LLE deficits/detail LLE Deficits / Details: limited movement of L LE, required assist for bed mobilitiy LLE: Unable to fully assess due to pain   Balance    End of Session PT - End of Session Activity Tolerance: Patient limited by pain;Patient limited by fatigue Patient left: in bed;with call bell/phone within reach  GP     [facility] E 05/25/2013, 12:53 PM Zenovia Jarred, PT, DPT 05/25/2013 Pager: [phone]

## 2013-05-26 LAB — CBC
HCT: 24.4 % — ABNORMAL LOW (ref 36.0–46.0)
Hemoglobin: 8 g/dL — ABNORMAL LOW (ref 12.0–15.0)
MCH: 29.9 pg (ref 26.0–34.0)
MCV: 91 fL (ref 78.0–100.0)
RBC: 2.68 MIL/uL — ABNORMAL LOW (ref 3.87–5.11)
WBC: 15.9 10*3/uL — ABNORMAL HIGH (ref 4.0–10.5)

## 2013-05-26 LAB — BASIC METABOLIC PANEL
BUN: 15 mg/dL (ref 6–23)
CO2: 21 mEq/L (ref 19–32)
Chloride: 101 mEq/L (ref 96–112)
Creatinine, Ser: 1.05 mg/dL (ref 0.50–1.10)
Glucose, Bld: 110 mg/dL — ABNORMAL HIGH (ref 70–99)
Potassium: 3.8 mEq/L (ref 3.5–5.1)

## 2013-05-26 MED ORDER — REWETTING DROPS SOLN: 2.0000 [drp] | Freq: Every evening | Status: DC

## 2013-05-26 MED ORDER — ASPIRIN 325 MG PO TABS
325.0000 mg | ORAL_TABLET | Freq: Every day | ORAL | Status: DC
  Administered 2013-05-26 – 2013-05-29 (×4): 325 mg via ORAL
  Filled 2013-05-26 (×4): qty 1

## 2013-05-26 MED ORDER — SODIUM CHLORIDE 0.9 % IV SOLN
INTRAVENOUS | Status: DC
  Administered 2013-05-26: 09:00:00 via INTRAVENOUS

## 2013-05-26 MED ORDER — POLYVINYL ALCOHOL 1.4 % OP SOLN
2.0000 [drp] | Freq: Every evening | OPHTHALMIC | Status: DC
  Administered 2013-05-26 – 2013-05-28 (×2): 2 [drp] via OPHTHALMIC
  Filled 2013-05-26: qty 15

## 2013-05-26 MED ORDER — ADULT MULTIVITAMIN W/MINERALS CH
1.0000 | ORAL_TABLET | Freq: Every day | ORAL | Status: DC
  Administered 2013-05-26 – 2013-05-29 (×4): 1 via ORAL
  Filled 2013-05-26 (×4): qty 1

## 2013-05-26 MED ORDER — CALCIUM CARBONATE-VITAMIN D 500-200 MG-UNIT PO TABS
1.0000 | ORAL_TABLET | Freq: Every day | ORAL | Status: DC
  Administered 2013-05-26 – 2013-05-29 (×4): 1 via ORAL
  Filled 2013-05-26 (×5): qty 1

## 2013-05-26 MED ORDER — VITAMIN C 500 MG PO TABS
500.0000 mg | ORAL_TABLET | Freq: Every day | ORAL | Status: DC
  Administered 2013-05-26 – 2013-05-29 (×4): 500 mg via ORAL
  Filled 2013-05-26 (×4): qty 1

## 2013-05-26 MED ORDER — CALCIUM-VITAMIN D 600-400 MG-UNIT PO TABS: 1.0000 | ORAL_TABLET | Freq: Every day | ORAL | Status: DC

## 2013-05-26 NOTE — Progress Notes (Signed)
Physical Therapy Treatment Patient Details Name: Lindsey Hensley MRN: [id number] DOB: 08/07/1922 Today's Date: 05/26/2013 Time: 4098-1191 PT Time Calculation (min): 20 min  PT Assessment / Plan / Recommendation  History of Present Illness 79 y.o. female has a past medical history significant for previous CVA with mild left-sided weakness, was able to live independently and do all her ADLs, and no other major medical problems, comes in with a chief complaint of left hip pain after she fell while working in her garden last night. She felt fine afterwards, and was able to walk, however this morning she noticed increasing pain and she decided to get checked out in the emergency room. Hip x-ray showed left femoral neck fracture.  S/p L hip hemiarthroplasty   PT Comments   Better activity tolerance today, though still relatively hypotensive Requested CIR screen  Follow Up Recommendations  CIR (worth considering CIR)     Does the patient have the potential to tolerate intense rehabilitation     Barriers to Discharge        Equipment Recommendations  None recommended by PT    Recommendations for Other Services Rehab consult  Frequency Min 3X/week   Progress towards PT Goals Progress towards PT goals: Progressing toward goals  Plan Current plan remains appropriate    Precautions / Restrictions Precautions Precautions: Posterior Hip;Fall Precaution Booklet Issued: Yes (comment) Precaution Comments: Reviewed posterior hip precautions. Restrictions LLE Weight Bearing: Weight bearing as tolerated   Pertinent Vitals/Pain Painful L hip, though did not rate Noted BP 90/57, and pt reported dizzy with standing activity; RN aware     Mobility  Bed Mobility Details for Bed Mobility Assistance: Was in recliner upon arrival Transfers Transfers: Sit to Stand;Stand to Sit Sit to Stand: 3: Mod assist;With armrests;From chair/3-in-1;With upper extremity assist Stand to Sit: 3: Mod assist;With  armrests;With upper extremity assist;To chair/3-in-1 Details for Transfer Assistance: Cues for LLE positioning for precautions, hand placement and safety; Repuired assist to power-up Ambulation/Gait Ambulation/Gait Assistance: 1: +2 Total assist Ambulation/Gait: Patient Percentage: 50% Ambulation Distance (Feet):  (performed a few very small steps in place) Assistive device: Rolling walker Ambulation/Gait Assistance Details: Performed a few small steps in place; Heavy dependence on UE support on RW    Exercises     PT Diagnosis:    PT Problem List:   PT Treatment Interventions:     PT Goals (current goals can now be found in the care plan section) Acute Rehab PT Goals Patient Stated Goal: not stated Time For Goal Achievement: 06/01/13 Potential to Achieve Goals: Good  Visit Information  Last PT Received On: 05/26/13 Assistance Needed: +2 History of Present Illness: 79 y.o. female has a past medical history significant for previous CVA with mild left-sided weakness, was able to live independently and do all her ADLs, and no other major medical problems, comes in with a chief complaint of left hip pain after she fell while working in her garden last night. She felt fine afterwards, and was able to walk, however this morning she noticed increasing pain and she decided to get checked out in the emergency room. Hip x-ray showed left femoral neck fracture.  S/p L hip hemiarthroplasty    Subjective Data  Subjective: Willing to work with PT; still a bit dizzy Patient Stated Goal: not stated   Cognition  Cognition Arousal/Alertness: Awake/alert Behavior During Therapy: WFL for tasks assessed/performed Overall Cognitive Status: Within Functional Limits for tasks assessed Memory: Decreased recall of precautions;Decreased short-term memory Problem Solving: Slow  processing;Requires verbal cues;Requires tactile cues    Balance  Balance Balance Assessed: Yes Static Standing Balance Static  Standing - Balance Support: Bilateral upper extremity supported Static Standing - Level of Assistance: 4: Min assist;3: Mod assist Static Standing - Comment/# of Minutes: 2 standign trials in which we atempted to get BPs; first trial, pt tended to lean L, and required support (pt attributes this to her CVA); Better performance with second trial, with much better midline standing; pt did not report increased dizziness when asked, but she did repuest to sit down after less than 30 seconds  End of Session PT - End of Session Equipment Utilized During Treatment: Gait belt Activity Tolerance: Patient limited by pain;Patient limited by fatigue Patient left: in chair;with call bell/phone within reach Nurse Communication: Mobility status   GP     Van Clines [facility] [place], [place] [phone]  05/26/2013, 5:06 PM

## 2013-05-26 NOTE — Progress Notes (Signed)
Patient ID: Lindsey Hensley  female  YNW:[id number]    DOB: Feb 17, 1922    DOA: 05/23/2013  PCP: Kari Baars, MD  Assessment/Plan: Active Problems:   Hip fracture, left   History of CVA (cerebrovascular accident)    Hip fracture - status post ground-level fall, without loss of consciousness.  - Status post left hip arthroplasty on 05/24/2013 - Continue PT OT, will likely need skilled nursing facility for rehabilitation - Ortho on board  History of CVA - very mild, if any left-sided weakness. Stable.  - Restarted aspirin 325 mg daily  Leukocytosis - trending down today , afebrile - UA negative for any UTI, chest x-ray negative for any pneumonia - Blood cultures negative so far   Anemia: Likely dilutional and postop - Follow CBC closely, transfuse for hemoglobin less than 8, hb 8.0 - Hemoglobin at the time of admission 10.8, currently 8.0  DVT Prophylaxis:per  Orthopedic surgery, currently on Lovenox  Code Status:  Disposition: 24hr supervision/SNF    Subjective: Feeling somewhat better today  Objective: Weight change:   Intake/Output Summary (Last 24 hours) at 05/26/13 1242 Last data filed at 05/25/13 2300  Gross per 24 hour  Intake 2062.5 ml  Output      0 ml  Net 2062.5 ml   Blood pressure 89/49, pulse 90, temperature 100.2 F (37.9 C), temperature source Oral, resp. rate 18, height 5' 5.5" (1.664 m), weight 55.339 kg (122 lb), SpO2 95.00%.  Physical Exam: General: Alert and awake, oriented x3, not in any acute distress. CVS: S1-S2 clear, no murmur rubs or gallops Chest: clear to auscultation bilaterally, no wheezing, rales or rhonchi Abdomen: soft nontender, nondistended, normal bowel sounds  Extremities: no cyanosis, clubbing or edema noted bilaterally   Lab Results: Basic Metabolic Panel:  Recent Labs Lab 05/25/13 0600 05/26/13 0702  NA 132* 131*  K 3.6 3.8  CL 100 101  CO2 23 21  GLUCOSE 114* 110*  BUN 17 15  CREATININE 1.10 1.05  CALCIUM  8.2* 8.1*   Liver Function Tests: No results found for this basename: AST, ALT, ALKPHOS, BILITOT, PROT, ALBUMIN,  in the last 168 hours No results found for this basename: LIPASE, AMYLASE,  in the last 168 hours No results found for this basename: AMMONIA,  in the last 168 hours CBC:  Recent Labs Lab 05/23/13 1130  05/25/13 0600 05/26/13 0702  WBC 15.0*  < > 17.9* 15.9*  NEUTROABS 9.4*  --   --   --   HGB 10.8*  < > 8.6* 8.0*  HCT 33.5*  < > 25.5* 24.4*  MCV 91.5  < > 90.4 91.0  PLT 153  < > 107* 106*  < > = values in this interval not displayed. Cardiac Enzymes:  Recent Labs Lab 05/23/13 1130  TROPONINI <0.30   BNP: No components found with this basename: POCBNP,  CBG: No results found for this basename: GLUCAP,  in the last 168 hours   Micro Results: Recent Results (from the past 240 hour(s))  SURGICAL PCR SCREEN     Status: Abnormal   Collection Time    05/24/13  6:35 AM      Result Value Range Status   MRSA, PCR POSITIVE (*) NEGATIVE Final   Staphylococcus aureus POSITIVE (*) NEGATIVE Final   Comment:            The Xpert SA Assay (FDA     approved for NASAL specimens     in patients over 72 years of age),  is one component of     a comprehensive surveillance     program.  Test performance has     been validated by [facility] for patients greater     than or equal to 23 year old.     It is not intended     to diagnose infection nor to     guide or monitor treatment.  CULTURE, BLOOD (ROUTINE X 2)     Status: None   Collection Time    05/24/13  9:50 AM      Result Value Range Status   Specimen Description BLOOD RIGHT ARM   Final   Special Requests BOTTLES DRAWN AEROBIC AND ANAEROBIC 10CC   Final   Culture  Setup Time     Final   Value: 05/24/2013 14:32     Performed at [facility]   Culture     Final   Value:        BLOOD CULTURE RECEIVED NO GROWTH TO DATE CULTURE WILL BE HELD FOR 5 DAYS BEFORE ISSUING A FINAL NEGATIVE REPORT      Performed at [facility]   Report Status PENDING   Incomplete  CULTURE, BLOOD (ROUTINE X 2)     Status: None   Collection Time    05/24/13  9:55 AM      Result Value Range Status   Specimen Description BLOOD RIGHT HAND   Final   Special Requests BOTTLES DRAWN AEROBIC AND ANAEROBIC 10CC   Final   Culture  Setup Time     Final   Value: 05/24/2013 14:31     Performed at [facility]   Culture     Final   Value:        BLOOD CULTURE RECEIVED NO GROWTH TO DATE CULTURE WILL BE HELD FOR 5 DAYS BEFORE ISSUING A FINAL NEGATIVE REPORT     Performed at [facility]   Report Status PENDING   Incomplete    Studies/Results: Dg Hip Complete Left  05/23/2013   *RADIOLOGY REPORT*  Clinical Data: Fall with left hip pain.  LEFT HIP - COMPLETE 2+ VIEW  Comparison: None.  Findings: There is a displaced fracture of the left femoral neck with superior displacement of the distal fracture fragment. Femoral head remains located in its acetabulum.  Right hip is unremarkable.  IMPRESSION: Left femoral neck fracture.   Original Report Authenticated By: Leanna Battles, M.D.   Dg Chest Port 1 View  05/23/2013   *RADIOLOGY REPORT*  Clinical Data: Fall, weakness  PORTABLE CHEST - 1 VIEW  Comparison: Feb 25, 2011.  Findings: Cardiomediastinal silhouette appears normal.  No acute pulmonary disease is noted.  No pleural effusion or pneumothorax is noted.  Bony thorax is intact.  IMPRESSION: No acute cardiopulmonary abnormality seen.   Original Report Authenticated By: Lupita Raider.,  M.D.    Medications: Scheduled Meds: . docusate sodium  100 mg Oral BID  . enoxaparin (LOVENOX) injection  40 mg Subcutaneous Q24H  . senna  1 tablet Oral BID      LOS: 3 days   RAI,RIPUDEEP M.D. [facility] 05/26/2013, 12:42 PM Pager: [phone]  If 7PM-7AM, please contact night-coverage [email or web address] Password [email or web address]

## 2013-05-26 NOTE — Progress Notes (Signed)
    Subjective:  Patient reports pain as mild  Objective:   VITALS:   Filed Vitals:   05/26/13 0808 05/26/13 1159 05/26/13 1325 05/26/13 1612  BP:   106/73   Pulse:   94   Temp:   98.6 F (37 C)   TempSrc:   Oral   Resp: 16 18 18 18   Height:      Weight:      SpO2: 97% 95% 93% 95%    Physical Exam  Dressing: C/D/I  Compartments soft  Distally NVI  LABS  Results for orders placed during the hospital encounter of 05/23/13 (from the past 24 hour(s))  CBC     Status: Abnormal   Collection Time    05/26/13  7:02 AM      Result Value Range   WBC 15.9 (*) 4.0 - 10.5 K/uL   RBC 2.68 (*) 3.87 - 5.11 MIL/uL   Hemoglobin 8.0 (*) 12.0 - 15.0 g/dL   HCT 40.9 (*) 81.1 - 91.4 %   MCV 91.0  78.0 - 100.0 fL   MCH 29.9  26.0 - 34.0 pg   MCHC 32.8  30.0 - 36.0 g/dL   RDW 78.2  95.6 - 21.3 %   Platelets 106 (*) 150 - 400 K/uL  BASIC METABOLIC PANEL     Status: Abnormal   Collection Time    05/26/13  7:02 AM      Result Value Range   Sodium 131 (*) 135 - 145 mEq/L   Potassium 3.8  3.5 - 5.1 mEq/L   Chloride 101  96 - 112 mEq/L   CO2 21  19 - 32 mEq/L   Glucose, Bld 110 (*) 70 - 99 mg/dL   BUN 15  6 - 23 mg/dL   Creatinine, Ser 0.86  0.50 - 1.10 mg/dL   Calcium 8.1 (*) 8.4 - 10.5 mg/dL   GFR calc non Af Amer 46 (*) >90 mL/min   GFR calc Af Amer 53 (*) >90 mL/min     Assessment/Plan: 2 Days Post-Op   Active Problems:   Hip fracture, left   History of CVA (cerebrovascular accident)   PLAN: Weight Bearing: WBAT LLE Posterior hip precautions Dressings: reinforce as needed VTE prophylaxis: Lovenox and SCD's Dispo: SNF   Irene Mitcham, D 05/26/2013, 5:57 PM   Margarita Rana, MD Cell [phone]

## 2013-05-27 DIAGNOSIS — W19XXXA Unspecified fall, initial encounter: Secondary | ICD-10-CM

## 2013-05-27 DIAGNOSIS — S72009A Fracture of unspecified part of neck of unspecified femur, initial encounter for closed fracture: Secondary | ICD-10-CM

## 2013-05-27 LAB — CBC
HCT: 23.9 % — ABNORMAL LOW (ref 36.0–46.0)
Hemoglobin: 8.1 g/dL — ABNORMAL LOW (ref 12.0–15.0)
MCHC: 33.9 g/dL (ref 30.0–36.0)
MCV: 89.5 fL (ref 78.0–100.0)
RDW: 13.8 % (ref 11.5–15.5)

## 2013-05-27 MED ORDER — CHLORHEXIDINE GLUCONATE CLOTH 2 % EX PADS: 6.0000 | MEDICATED_PAD | Freq: Every day | CUTANEOUS | Status: DC

## 2013-05-27 MED ORDER — MUPIROCIN 2 % EX OINT
1.0000 "application " | TOPICAL_OINTMENT | Freq: Two times a day (BID) | CUTANEOUS | Status: DC
  Administered 2013-05-27 – 2013-05-29 (×4): 1 via NASAL
  Filled 2013-05-27: qty 22

## 2013-05-27 MED ORDER — METHOCARBAMOL 500 MG PO TABS
500.0000 mg | ORAL_TABLET | Freq: Four times a day (QID) | ORAL | Status: DC | PRN
  Administered 2013-05-27: 500 mg via ORAL
  Filled 2013-05-27: qty 1

## 2013-05-27 NOTE — Progress Notes (Signed)
Rehab Admissions Coordinator Note:  Patient was screened by Clois Dupes for appropriateness for an Inpatient Acute Rehab Consult. Rehab consult is pending today.    Clois Dupes 05/27/2013, 9:17 AM  I can be reached at [phone].

## 2013-05-27 NOTE — Progress Notes (Signed)
Patient ID: Lindsey Hensley, female   DOB: [birth year], 79 y.o.   MRN: [id number]     Subjective:  Patient reports pain as mild.  Patient states that she is getting better.  Denies any CP or SOB   Objective:   VITALS:   Filed Vitals:   05/26/13 1325 05/26/13 1612 05/26/13 2121 05/27/13 0537  BP: 106/73  101/69 129/58  Pulse: 94  87 96  Temp: 98.6 F (37 C)  98 F (36.7 C) 98.4 F (36.9 C)  TempSrc: Oral  Oral Oral  Resp: 18 18 16 16   Height:      Weight:      SpO2: 93% 95% 95% 93%    ABD soft Sensation intact distally Dorsiflexion/Plantar flexion intact Incision: dressing C/D/I and scant drainage Dressing removed wound clean and dry no sign of infection   Lab Results  Component Value Date   WBC 15.9* 05/26/2013   HGB 8.0* 05/26/2013   HCT 24.4* 05/26/2013   MCV 91.0 05/26/2013   PLT 106* 05/26/2013     Assessment/Plan: 3 Days Post-Op   Active Problems:   Hip fracture, left   History of CVA (cerebrovascular accident)   Advance diet Up with therapy WBAT Continue plan per medicine Dry dressing PRN   Haskel Khan 05/27/2013, 7:25 AM   Teryl Lucy, MD Cell [phone]

## 2013-05-27 NOTE — Progress Notes (Signed)
Occupational Therapy Treatment Patient Details Name: Lindsey Hensley MRN: [id number] DOB: 1922/03/27 Today's Date: 05/27/2013 Time: 4098-1191 OT Time Calculation (min): 60 min  OT Assessment / Plan / Recommendation       OT comments  Pt. In bed with full internal roation of LLE including foot, educated pt. And pts. Son regarding need for proper LE positioning in bed and when up.  Reviewed the pillow placement to assist with this.  No c/o hip pain, only back pain but still poor initiation of step progression with max a for sit/stand.  Able to pivot to bsc with mod a and inst. cues  Follow Up Recommendations  CIR;SNF                      Frequency Min 2X/week   Progress towards OT Goals Progress towards OT goals: Progressing toward goals  Plan Discharge plan remains appropriate    Precautions / Restrictions Precautions Precautions: Posterior Hip;Fall Precaution Comments: pt. aware of precautions but can not recall any of them. reviewed them with hand out Restrictions LLE Weight Bearing: Weight bearing as tolerated   Pertinent Vitals/Pain 4 to 5/10, in back repositioned, r.n. Present and provided meds    ADL  Toilet Transfer: Performed;Maximal assistance Toilet Transfer Method: Stand pivot;Sit to [place]: Bedside commode Toileting - Clothing Manipulation and Hygiene: Performed;Set up Where Assessed - Toileting Clothing Manipulation and Hygiene: Sit on 3-in-1 or toilet Transfers/Ambulation Related to ADLs: max a sit/stand from bed, stand pivot max a wtih max inst. and tactile cues for tech. and hand placement ADL Comments: transfer to bsc max a, peri care set up seated    OT Goals(current goals can now be found in the care plan section)    Visit Information  Last OT Received On: 05/27/13                 Cognition  Cognition Arousal/Alertness: Awake/alert Behavior During Therapy: [facility] for tasks assessed/performed Overall Cognitive Status:  Within Functional Limits for tasks assessed Area of Impairment: Problem solving Memory: Decreased short-term memory;Decreased recall of precautions Problem Solving: Slow processing;Requires verbal cues;Requires tactile cues General Comments: unable to recall hip precautions    Mobility  Bed Mobility Bed Mobility: Rolling Left;Left Sidelying to Sit;Sitting - Scoot to Edge of Bed Rolling Left: 3: Mod assist Left Sidelying to Sit: 3: Mod assist;2: Max assist Sitting - Scoot to [place] of Bed: 2: Max assist Transfers Transfers: Sit to Stand;Stand to Sit Sit to Stand: 2: Max assist;With upper extremity assist;From bed Stand to Sit: 3: Mod assist;With armrests Details for Transfer Assistance: LLE for positioning and for precautions, hand placements and safety              End of Session OT - End of Session Equipment Utilized During Treatment: Rolling walker Activity Tolerance: Patient tolerated treatment well Patient left: in chair;with call bell/phone within reach;with family/visitor present       Robet Leu, COTA/L 05/27/2013, 11:30 AM

## 2013-05-27 NOTE — Progress Notes (Addendum)
Physical Therapy Treatment Patient Details Name: Lindsey Hensley MRN: [id number] DOB: 1922/04/17 Today's Date: 05/27/2013 Time: 1329-1400 PT Time Calculation (min): 31 min  PT Assessment / Plan / Recommendation  History of Present Illness 79 y.o. female has a past medical history significant for previous CVA with mild left-sided weakness, was able to live independently and do all her ADLs, and no other major medical problems, comes in with a chief complaint of left hip pain after she fell while working in her garden last night. She felt fine afterwards, and was able to walk, however this morning she noticed increasing pain and she decided to get checked out in the emergency room. Hip x-ray showed left femoral neck fracture.  S/p L hip hemiarthroplasty   PT Comments   Making gains in mobility and activity tolerance; Still hypotensive with activity, but asymptomatic for dizziness, and per Nurse Tech, BPs with activity are consistent with BPs throughout the day  Follow Up Recommendations  CIR (worth considering CIR)     Does the patient have the potential to tolerate intense rehabilitation     Barriers to Discharge        Equipment Recommendations  None recommended by PT    Recommendations for Other Services Rehab consult  Frequency Min 5X/week   Progress towards PT Goals Progress towards PT goals: Progressing toward goals  Plan Current plan remains appropriate    Precautions / Restrictions Precautions Precautions: Posterior Hip;Fall Precaution Booklet Issued: Yes (comment) Precaution Comments: pt. aware of precautions but can not recall any of them. reviewed them with hand out Restrictions LLE Weight Bearing: Weight bearing as tolerated   Pertinent Vitals/Pain Pain in L hip, pt unable to rate patient repositioned for comfort  BP initially in chair 86/59 BP post amb 6 ft 109/54 BP in bed post amb 106/49    Mobility  Bed Mobility Bed Mobility: Sit to Supine Rolling Left: 3: Mod  assist Left Sidelying to Sit: 3: Mod assist;2: Max assist Sitting - Scoot to [place] of Bed: 2: Max assist Sit to Supine: 1: +2 Total assist;HOB flat Sit to Supine: Patient Percentage: 40% Scooting to HOB: With trapeze;Other (comment);4: Min assist Details for Bed Mobility Assistance: Cues for technique; physical assist for LLE into bed Transfers Transfers: Sit to Stand;Stand to Sit Sit to Stand: 3: Mod assist;With armrests;From chair/3-in-1;With upper extremity assist Stand to Sit: 3: Mod assist;With armrests;With upper extremity assist;To chair/3-in-1 Details for Transfer Assistance: Cues for LLE positioning for precautions, hand placement and safety; Repuired assist to power-up Ambulation/Gait Ambulation/Gait Assistance: 4: Min assist (second person present for safety as pt has been hypotensive) Ambulation Distance (Feet): 45 Feet Assistive device: Rolling walker Ambulation/Gait Assistance Details: Cues for gait sequence and prec with turns; trunk flexed Gait Pattern: Step-to pattern    Exercises     PT Diagnosis:    PT Problem List:   PT Treatment Interventions:     PT Goals (current goals can now be found in the care plan section) Acute Rehab PT Goals Patient Stated Goal: back to gardening Time For Goal Achievement: 06/01/13 Potential to Achieve Goals: Good  Visit Information  Last PT Received On: 05/27/13 Assistance Needed: +2 (second person for safety; hopefully +1 soon) History of Present Illness: 79 y.o. female has a past medical history significant for previous CVA with mild left-sided weakness, was able to live independently and do all her ADLs, and no other major medical problems, comes in with a chief complaint of left hip pain after she  fell while working in her garden last night. She felt fine afterwards, and was able to walk, however this morning she noticed increasing pain and she decided to get checked out in the emergency room. Hip x-ray showed left femoral neck  fracture.  S/p L hip hemiarthroplasty    Subjective Data  Subjective: Willing to work with PT; still a bit dizzy Patient Stated Goal: back to gardening   Cognition  Cognition Arousal/Alertness: Awake/alert Behavior During Therapy: WFL for tasks assessed/performed Overall Cognitive Status: Within Functional Limits for tasks assessed Area of Impairment: Problem solving Memory: Decreased recall of precautions;Decreased short-term memory Problem Solving: Slow processing;Requires verbal cues;Requires tactile cues General Comments: Tended to repeat that she had gentlemen visiting her earlier (from her church)    Balance     End of Session PT - End of Session Equipment Utilized During Treatment: Gait belt Activity Tolerance: Patient tolerated treatment well Patient left: with call bell/phone within reach;in bed Nurse Communication: Mobility status   GP     Olen Pel [place], [place] [phone]  05/27/2013, 2:33 PM

## 2013-05-27 NOTE — Progress Notes (Signed)
Patient ID: Lindsey Hensley  female  RUE:[id number]    DOB: 05/23/1922    DOA: 05/23/2013  PCP: Kari Baars, MD  Assessment/Plan:  Left Hip fracture - status post ground-level fall, without loss of consciousness.  - Status post left hip arthroplasty on 05/24/2013 - Continue PT OT, will likely need skilled nursing facility for rehabilitation - PT also rec'd CIR, consult placed  - Ortho on board  History of CVA - very mild, if any left-sided weakness. Stable.  - cont aspirin 325 mg daily  Leukocytosis - trending down, afebrile - UA negative for any UTI, chest x-ray negative for any pneumonia - Blood cultures negative so far   Anemia: Likely dilutional and postop - Hb 8.1, transfuse for hemoglobin less than 8,    DVT Prophylaxis:per  Orthopedic surgery, currently on Lovenox  Code Status:  Disposition: 24hr supervision/SNF/ CIR    Subjective: Feeling  better, eating her breakfast, denies any specific complaints  Objective: Weight change:  No intake or output data in the 24 hours ending 05/27/13 1053 Blood pressure 129/58, pulse 96, temperature 98.4 F (36.9 C), temperature source Oral, resp. rate 16, height 5' 5.5" (1.664 m), weight 55.339 kg (122 lb), SpO2 93.00%.  Physical Exam: General: A x O x3, not in any acute distress. CVS: S1-S2 clear Chest: CTAB Abdomen: soft NT, ND, NBS Extremities: no c/c/e bilaterally   Lab Results: Basic Metabolic Panel:  Recent Labs Lab 05/25/13 0600 05/26/13 0702  NA 132* 131*  K 3.6 3.8  CL 100 101  CO2 23 21  GLUCOSE 114* 110*  BUN 17 15  CREATININE 1.10 1.05  CALCIUM 8.2* 8.1*   Liver Function Tests: No results found for this basename: AST, ALT, ALKPHOS, BILITOT, PROT, ALBUMIN,  in the last 168 hours No results found for this basename: LIPASE, AMYLASE,  in the last 168 hours No results found for this basename: AMMONIA,  in the last 168 hours CBC:  Recent Labs Lab 05/23/13 1130  05/26/13 0702 05/27/13 0748  WBC  15.0*  < > 15.9* 12.9*  NEUTROABS 9.4*  --   --   --   HGB 10.8*  < > 8.0* 8.1*  HCT 33.5*  < > 24.4* 23.9*  MCV 91.5  < > 91.0 89.5  PLT 153  < > 106* 144*  < > = values in this interval not displayed. Cardiac Enzymes:  Recent Labs Lab 05/23/13 1130  TROPONINI <0.30   BNP: No components found with this basename: POCBNP,  CBG: No results found for this basename: GLUCAP,  in the last 168 hours   Micro Results: Recent Results (from the past 240 hour(s))  SURGICAL PCR SCREEN     Status: Abnormal   Collection Time    05/24/13  6:35 AM      Result Value Range Status   MRSA, PCR POSITIVE (*) NEGATIVE Final   Staphylococcus aureus POSITIVE (*) NEGATIVE Final   Comment:            The Xpert SA Assay (FDA     approved for NASAL specimens     in patients over 41 years of age),     is one component of     a comprehensive surveillance     program.  Test performance has     been validated by [employer] for patients greater     than or equal to 35 year old.     It is not intended  to diagnose infection nor to     guide or monitor treatment.  CULTURE, BLOOD (ROUTINE X 2)     Status: None   Collection Time    05/24/13  9:50 AM      Result Value Range Status   Specimen Description BLOOD RIGHT ARM   Final   Special Requests BOTTLES DRAWN AEROBIC AND ANAEROBIC 10CC   Final   Culture  Setup Time     Final   Value: 05/24/2013 14:32     Performed at [facility]   Culture     Final   Value:        BLOOD CULTURE RECEIVED NO GROWTH TO DATE CULTURE WILL BE HELD FOR 5 DAYS BEFORE ISSUING A FINAL NEGATIVE REPORT     Performed at [facility]   Report Status PENDING   Incomplete  CULTURE, BLOOD (ROUTINE X 2)     Status: None   Collection Time    05/24/13  9:55 AM      Result Value Range Status   Specimen Description BLOOD RIGHT HAND   Final   Special Requests BOTTLES DRAWN AEROBIC AND ANAEROBIC 10CC   Final   Culture  Setup Time     Final   Value: 05/24/2013  14:31     Performed at [facility]   Culture     Final   Value:        BLOOD CULTURE RECEIVED NO GROWTH TO DATE CULTURE WILL BE HELD FOR 5 DAYS BEFORE ISSUING A FINAL NEGATIVE REPORT     Performed at [facility]   Report Status PENDING   Incomplete    Studies/Results: Dg Hip Complete Left  05/23/2013   *RADIOLOGY REPORT*  Clinical Data: Fall with left hip pain.  LEFT HIP - COMPLETE 2+ VIEW  Comparison: None.  Findings: There is a displaced fracture of the left femoral neck with superior displacement of the distal fracture fragment. Femoral head remains located in its acetabulum.  Right hip is unremarkable.  IMPRESSION: Left femoral neck fracture.   Original Report Authenticated By: Leanna Battles, M.D.   Dg Chest Port 1 View  05/23/2013   *RADIOLOGY REPORT*  Clinical Data: Fall, weakness  PORTABLE CHEST - 1 VIEW  Comparison: Feb 25, 2011.  Findings: Cardiomediastinal silhouette appears normal.  No acute pulmonary disease is noted.  No pleural effusion or pneumothorax is noted.  Bony thorax is intact.  IMPRESSION: No acute cardiopulmonary abnormality seen.   Original Report Authenticated By: Lupita Raider.,  M.D.    Medications: Scheduled Meds: . aspirin  325 mg Oral Daily  . calcium-vitamin D  1 tablet Oral Q breakfast  . docusate sodium  100 mg Oral BID  . enoxaparin (LOVENOX) injection  40 mg Subcutaneous Q24H  . multivitamin with minerals  1 tablet Oral Daily  . polyvinyl alcohol  2 drop Both Eyes QPM  . senna  1 tablet Oral BID  . vitamin C  500 mg Oral Daily      LOS: 4 days   Solomon Skowronek M.D. [facility] 05/27/2013, 10:53 AM Pager: [phone]  If 7PM-7AM, please contact night-coverage [email or web address] Password [email or web address]

## 2013-05-27 NOTE — Consult Note (Signed)
Physical Medicine and Rehabilitation Consult Reason for Consult: Left femoral neck fracture Referring Physician: [facility]   HPI: Lindsey Hensley is a 79 y.o. right-handed female with history of CVA in 2012 with left-sided weakness. Patient was able to live independently and do all her ADLs prior to admission with her son checking on her as needed. Admitted 05/23/2013 after a fall while working in her garden. She denied any chest pain or syncope related to the fall. X-rays and imaging revealed left femoral neck fracture. Underwent left hip hemiarthroplasty 05/24/2013 per Dr. Dion Saucier. Patient is weightbearing as tolerated with posterior hip cautions. Maintained on subcutaneous Lovenox for DVT prophylaxis. Acute blood loss anemia with hemoglobin 8.1 and monitored. Physical and occupational therapy evaluations completed 05/25/2013. M.D. is requested physical medicine rehabilitation consult to consider inpatient rehabilitation services.   Review of Systems  Gastrointestinal: Positive for constipation.  Musculoskeletal: Positive for myalgias and joint pain.  Neurological:       Left-sided weakness  All other systems reviewed and are negative.   Past Medical History  Diagnosis Date  . Stroke   . GERD (gastroesophageal reflux disease)   . Arthritis     hands   Past Surgical History  Procedure Laterality Date  . Cholecystectomy    . Ectopic pregnancy surgery     History reviewed. No pertinent family history. Social History:  reports that she has quit smoking. She has never used smokeless tobacco. She reports that  drinks alcohol. She reports that she does not use illicit drugs. Allergies:  Allergies  Allergen Reactions  . Sulfa Antibiotics Other (See Comments)    unknown   Medications Prior to Admission  Medication Sig Dispense Refill  . aspirin 325 MG tablet Take 650 mg by mouth once.      . Calcium Carb-Cholecalciferol (CALCIUM-VITAMIN D) 600-400 MG-UNIT TABS Take 1 tablet by mouth daily.       . Multiple Vitamin (MULTIVITAMIN WITH MINERALS) TABS tablet Take 1 tablet by mouth daily.      Marland Kitchen POTASSIUM PO Take 1 tablet by mouth daily. Possibly 500mg  tablet      . Soft Lens Products (REWETTING DROPS) SOLN 2 drops by Does not apply route every evening. For dry eyes      . vitamin C (ASCORBIC ACID) 500 MG tablet Take 500 mg by mouth daily.        Home: Home Living Family/patient expects to be discharged to:: Skilled nursing facility Living Arrangements: Alone Available Help at Discharge: Family;Available PRN/intermittently (spoke with son and he said he can stay with her 24/7 if need) Type of Home: House Home Access: Stairs to enter [employer] of Steps: 4 and 1 at two different entrances Entrance Stairs-Rails: Right  Functional History:   Functional Status:  Mobility: Bed Mobility Bed Mobility: Supine to Sit;Sitting - Scoot to [place] of Bed;Sit to Supine Rolling Right: 3: Mod assist Supine to Sit: 2: Max assist Sitting - Scoot to [place] of Bed: 2: Max assist Sit to Supine: 1: +2 Total assist;HOB flat Sit to Supine: Patient Percentage: 0% Scooting to [facility]: With trapeze;Other (comment);4: Min assist (trendelenberg) Transfers Transfers: Sit to Stand;Stand to Sit Sit to Stand: 3: Mod assist;With armrests;From chair/3-in-1;With upper extremity assist Sit to Stand: Patient Percentage: 50% (0% for sit to stand from [facility] and to bed) Stand to Sit: 3: Mod assist;With armrests;With upper extremity assist;To chair/3-in-1 Stand to Sit: Patient Percentage: 60% Ambulation/Gait Ambulation/Gait Assistance: 1: +2 Total assist Ambulation/Gait: Patient Percentage: 50% Ambulation Distance (Feet):  (performed  a few very small steps in place) Assistive device: Rolling walker Ambulation/Gait Assistance Details: Performed a few small steps in place; Heavy dependence on UE support on RW    ADL: ADL Eating/Feeding: Independent Where Assessed - Eating/Feeding: Bed level Grooming: Set  up;Supervision/safety Where Assessed - Grooming: Supine, head of bed up Upper Body Bathing: Minimal assistance Where Assessed - Upper Body Bathing: Supported sitting (sitting EOB) Lower Body Bathing: +1 Total assistance Where Assessed - Lower Body Bathing: Supported sit to stand Upper Body Dressing: Minimal assistance Where Assessed - Upper Body Dressing: Supported sitting (sitting EOB) Lower Body Dressing: +1 Total assistance Where Assessed - Lower Body Dressing: Supported sit to Art therapist Transfer: Performed;+2 Total assistance Toilet Transfer Method: Stand pivot;Sit to Barista: Other (comment) (from elevated bed to [facility]) Tub/Shower Transfer Method: Not assessed Equipment Used: Gait belt;Rolling walker Transfers/Ambulation Related to ADLs: +2 total A (50%) stand pivot transfer and sit to stand from elevated bed.  +2 total A (0%) for sit <> stand from [facility] due to syncope episode. + 2 total A (60%) for stand to sit to [facility].  ADL Comments: Pt had to urinate upon OT's arrival. Pt performed stand pivot transfer to 3 in 1. Pt close to having syncope episode on 3 in 1 due to decrease in BP requiring +2 total A to get back to bed.   Cognition: Cognition Overall Cognitive Status: Within Functional Limits for tasks assessed Orientation Level: Oriented to person;Oriented to place;Oriented to situation;Disoriented to time Cognition Arousal/Alertness: Awake/alert Behavior During Therapy: WFL for tasks assessed/performed Overall Cognitive Status: Within Functional Limits for tasks assessed Area of Impairment: Problem solving;Awareness Memory: Decreased recall of precautions;Decreased short-term memory Problem Solving: Slow processing;Requires verbal cues;Requires tactile cues General Comments: unable to recall precautions from OT this morning, used bed pan however asked for bed pan again and forgot she had already used it  Blood pressure 129/58, pulse 96, temperature 98.4  F (36.9 C), temperature source Oral, resp. rate 16, height 5' 5.5" (1.664 m), weight 55.339 kg (122 lb), SpO2 93.00%. Physical Exam  Vitals reviewed. Constitutional: She is oriented to person, place, and time. She appears well-developed.  HENT:  Head: Normocephalic and atraumatic.  Eyes: EOM are normal.  Neck: Normal range of motion. Neck supple. No thyromegaly present.  Cardiovascular: Normal rate and regular rhythm.   Pulmonary/Chest: Effort normal and breath sounds normal. No respiratory distress.  Abdominal: Soft. Bowel sounds are normal. She exhibits no distension.  Musculoskeletal: She exhibits no edema.  Left hip slight edema, appropriately tender  Neurological: She is alert and oriented to person, place, and time. She has normal reflexes. No cranial nerve deficit. She exhibits normal muscle tone. Coordination normal.  Follows full commands. Alert. Oriented to month, year, not date. Seemed to have reasonable insight and awareness. Neurological exam non-focal----pain inhibition left hip. No sensory deficits  Skin:  Left Hip incision is dressed  Psychiatric: She has a normal mood and affect. Her behavior is normal.    Results for orders placed during the hospital encounter of 05/23/13 (from the past 24 hour(s))  CBC     Status: Abnormal   Collection Time    05/27/13  7:48 AM      Result Value Range   WBC 12.9 (*) 4.0 - 10.5 K/uL   RBC 2.67 (*) 3.87 - 5.11 MIL/uL   Hemoglobin 8.1 (*) 12.0 - 15.0 g/dL   HCT 16.1 (*) 09.6 - 04.5 %   MCV 89.5  78.0 -  100.0 fL   MCH 30.3  26.0 - 34.0 pg   MCHC 33.9  30.0 - 36.0 g/dL   RDW 16.1  09.6 - 04.5 %   Platelets 144 (*) 150 - 400 K/uL   No results found.  Assessment/Plan: Diagnosis: left femoral neck fracture 1. Does the need for close, 24 hr/day medical supervision in concert with the patient's rehab needs make it unreasonable for this patient to be served in a less intensive setting? Yes 2. Co-Morbidities requiring  supervision/potential complications: hx of cva, mild cognitive deficits, ABLA, pain 3. Due to bladder management, bowel management, safety, skin/wound care, disease management, medication administration, pain management and patient education, does the patient require 24 hr/day rehab nursing? Yes 4. Does the patient require coordinated care of a physician, rehab nurse, PT (1-2 hrs/day, 5 days/week) and OT (1-2 hrs/day, 5 days/week) to address physical and functional deficits in the context of the above medical diagnosis(es)? Yes Addressing deficits in the following areas: balance, endurance, locomotion, strength, transferring, bowel/bladder control, bathing, dressing, feeding, grooming, toileting and psychosocial support 5. Can the patient actively participate in an intensive therapy program of at least 3 hrs of therapy per day at least 5 days per week? Yes 6. The potential for patient to make measurable gains while on inpatient rehab is excellent 7. Anticipated functional outcomes upon discharge from inpatient rehab are supervision with PT, supervision to ?min assist with OT, n/a with SLP. 8. Estimated rehab length of stay to reach the above functional goals is: 10 days 9. Does the patient have adequate social supports to accommodate these discharge functional goals? Potentially 10. Anticipated D/C setting: Home 11. Anticipated post D/C treatments: HH therapy 12. Overall Rehab/Functional Prognosis: excellent  RECOMMENDATIONS: This patient's condition is appropriate for continued rehabilitative care in the following setting: CIR Patient has agreed to participate in recommended program. Yes Note that insurance prior authorization may be required for reimbursement for recommended care.  Comment: ?social supports---will need supervision initially once home. Rehab RN to follow up.   Ranelle Oyster, MD, [place]     05/27/2013

## 2013-05-28 DIAGNOSIS — D649 Anemia, unspecified: Secondary | ICD-10-CM

## 2013-05-28 LAB — CBC
HCT: 22.7 % — ABNORMAL LOW (ref 36.0–46.0)
MCH: 30.4 pg (ref 26.0–34.0)
MCV: 89.7 fL (ref 78.0–100.0)
Platelets: 171 10*3/uL (ref 150–400)
RBC: 2.53 MIL/uL — ABNORMAL LOW (ref 3.87–5.11)

## 2013-05-28 LAB — ABO/RH: ABO/RH(D): O NEG

## 2013-05-28 NOTE — Progress Notes (Signed)
Patient ID: Lindsey Hensley  female  ZOX:[id number]    DOB: April 13, 1922    DOA: 05/23/2013  PCP: Kari Baars, MD  Assessment/Plan:  Left Hip fracture - status post ground-level fall, without loss of consciousness.  - Status post left hip arthroplasty on 05/24/2013 - Continue PT OT, will likely need skilled nursing facility for rehabilitation - PT also rec'd CIR, consult placed,  awaiting decision - Ortho on board  History of CVA - very mild, if any left-sided weakness. Stable.  - cont aspirin 325 mg daily  Leukocytosis - trending down, afebrile - UA negative for any UTI, chest x-ray negative for any pneumonia - Blood cultures negative so far   Anemia: Likely dilutional and postop - Hb 7.7, transfuse 1 unit packed RBC today    DVT Prophylaxis:per  Orthopedic surgery, currently on Lovenox  Code Status:  Disposition: SNF/CIR, transfuse today    Subjective: Feeling  better, states that she is bored otherwise no acute issues  Objective: Weight change:   Intake/Output Summary (Last 24 hours) at 05/28/13 1232 Last data filed at 05/28/13 1230  Gross per 24 hour  Intake   12.5 ml  Output      0 ml  Net   12.5 ml   Blood pressure 143/62, pulse 89, temperature 98.3 F (36.8 C), temperature source Oral, resp. rate 18, height 5' 5.5" (1.664 m), weight 55.339 kg (122 lb), SpO2 97.00%.  Physical Exam: General: A x O x3, not in any acute distress. CVS: S1-S2 clear Chest: CTAB Abdomen: soft NT, ND, NBS Extremities: no c/c/e bilaterally   Lab Results: Basic Metabolic Panel:  Recent Labs Lab 05/25/13 0600 05/26/13 0702  NA 132* 131*  K 3.6 3.8  CL 100 101  CO2 23 21  GLUCOSE 114* 110*  BUN 17 15  CREATININE 1.10 1.05  CALCIUM 8.2* 8.1*   Liver Function Tests: No results found for this basename: AST, ALT, ALKPHOS, BILITOT, PROT, ALBUMIN,  in the last 168 hours No results found for this basename: LIPASE, AMYLASE,  in the last 168 hours No results found for this  basename: AMMONIA,  in the last 168 hours CBC:  Recent Labs Lab 05/23/13 1130  05/27/13 0748 05/28/13 0430  WBC 15.0*  < > 12.9* 11.2*  NEUTROABS 9.4*  --   --   --   HGB 10.8*  < > 8.1* 7.7*  HCT 33.5*  < > 23.9* 22.7*  MCV 91.5  < > 89.5 89.7  PLT 153  < > 144* 171  < > = values in this interval not displayed. Cardiac Enzymes:  Recent Labs Lab 05/23/13 1130  TROPONINI <0.30   BNP: No components found with this basename: POCBNP,  CBG: No results found for this basename: GLUCAP,  in the last 168 hours   Micro Results: Recent Results (from the past 240 hour(s))  SURGICAL PCR SCREEN     Status: Abnormal   Collection Time    05/24/13  6:35 AM      Result Value Range Status   MRSA, PCR POSITIVE (*) NEGATIVE Final   Staphylococcus aureus POSITIVE (*) NEGATIVE Final   Comment:            The Xpert SA Assay (FDA     approved for NASAL specimens     in patients over 19 years of age),     is one component of     a comprehensive surveillance     program.  Test performance has  been validated by [facility] for patients greater     than or equal to 39 year old.     It is not intended     to diagnose infection nor to     guide or monitor treatment.  CULTURE, BLOOD (ROUTINE X 2)     Status: None   Collection Time    05/24/13  9:50 AM      Result Value Range Status   Specimen Description BLOOD RIGHT ARM   Final   Special Requests BOTTLES DRAWN AEROBIC AND ANAEROBIC 10CC   Final   Culture  Setup Time     Final   Value: 05/24/2013 14:32     Performed at [facility]   Culture     Final   Value:        BLOOD CULTURE RECEIVED NO GROWTH TO DATE CULTURE WILL BE HELD FOR 5 DAYS BEFORE ISSUING A FINAL NEGATIVE REPORT     Performed at [facility]   Report Status PENDING   Incomplete  CULTURE, BLOOD (ROUTINE X 2)     Status: None   Collection Time    05/24/13  9:55 AM      Result Value Range Status   Specimen Description BLOOD RIGHT HAND   Final    Special Requests BOTTLES DRAWN AEROBIC AND ANAEROBIC 10CC   Final   Culture  Setup Time     Final   Value: 05/24/2013 14:31     Performed at [facility]   Culture     Final   Value:        BLOOD CULTURE RECEIVED NO GROWTH TO DATE CULTURE WILL BE HELD FOR 5 DAYS BEFORE ISSUING A FINAL NEGATIVE REPORT     Performed at [facility]   Report Status PENDING   Incomplete    Studies/Results: Dg Hip Complete Left  05/23/2013   *RADIOLOGY REPORT*  Clinical Data: Fall with left hip pain.  LEFT HIP - COMPLETE 2+ VIEW  Comparison: None.  Findings: There is a displaced fracture of the left femoral neck with superior displacement of the distal fracture fragment. Femoral head remains located in its acetabulum.  Right hip is unremarkable.  IMPRESSION: Left femoral neck fracture.   Original Report Authenticated By: Leanna Battles, M.D.   Dg Chest Port 1 View  05/23/2013   *RADIOLOGY REPORT*  Clinical Data: Fall, weakness  PORTABLE CHEST - 1 VIEW  Comparison: Feb 25, 2011.  Findings: Cardiomediastinal silhouette appears normal.  No acute pulmonary disease is noted.  No pleural effusion or pneumothorax is noted.  Bony thorax is intact.  IMPRESSION: No acute cardiopulmonary abnormality seen.   Original Report Authenticated By: Lupita Raider.,  M.D.    Medications: Scheduled Meds: . aspirin  325 mg Oral Daily  . calcium-vitamin D  1 tablet Oral Q breakfast  . Chlorhexidine Gluconate Cloth  6 each Topical Q0600  . docusate sodium  100 mg Oral BID  . enoxaparin (LOVENOX) injection  40 mg Subcutaneous Q24H  . multivitamin with minerals  1 tablet Oral Daily  . mupirocin ointment  1 application Nasal BID  . polyvinyl alcohol  2 drop Both Eyes QPM  . senna  1 tablet Oral BID  . vitamin C  500 mg Oral Daily      LOS: 5 days   RAI,RIPUDEEP M.D. [facility] 05/28/2013, 12:32 PM Pager: [phone]  If 7PM-7AM, please contact night-coverage [email or web address] Password [email or web address]

## 2013-05-28 NOTE — Clinical Social Work Placement (Addendum)
Clinical Social Work Department  CLINICAL SOCIAL WORK PLACEMENT NOTE  05/28/2013  Patient: Lindsey Hensley  Account Number: [id number] Admit date: 05/26/13  Clinical Social Worker: Sabino Niemann MSW Date/time: 05/28/2013 4:30 PM  Clinical Social Work is seeking post-discharge placement for this patient at the following level of care: SKILLED NURSING (*CSW will update this form in Epic as items are completed)  05/28/2013 Patient/family provided with [facility] Department of Clinical Social Work's list of facilities offering this level of care within the geographic area requested by the patient (or if unable, by the patient's family).  05/28/2013 Patient/family informed of their freedom to choose among providers that offer the needed level of care, that participate in Medicare, Medicaid or managed care program needed by the patient, have an available bed and are willing to accept the patient.  05/28/2013 Patient/family informed of [facility]' ownership interest in [facility], as well as of the fact that they are under no obligation to receive care at this facility.  PASARR submitted to EDS on 05/29/2013 PASARR number received from EDS on 05/29/2013   FL2 transmitted to all facilities in geographic area requested by pt/family on 05/28/2013  FL2 transmitted to all facilities within larger geographic area on  Patient informed that his/her managed care company has contracts with or will negotiate with certain facilities, including the following:  Patient/family informed of bed offers received: 05/29/2013 Patient chooses bed at [facility] Physician recommends and patient chooses bed at  Patient to be transferred to on 05/29/2013 Patient to be transferred to facility by [facility] The following physician request were entered in Epic:  Additional Comments:

## 2013-05-28 NOTE — Progress Notes (Signed)
Physical Therapy Treatment Patient Details Name: Lindsey Hensley MRN: [id number] DOB: Mar 30, 1922 Today's Date: 05/28/2013 Time: 9528-4132 PT Time Calculation (min): 32 min  PT Assessment / Plan / Recommendation  History of Present Illness 79 y.o. female has a past medical history significant for previous CVA with mild left-sided weakness, was able to live independently and do all her ADLs, and no other major medical problems, comes in with a chief complaint of left hip pain after she fell while working in her garden last night. She felt fine afterwards, and was able to walk, however this morning she noticed increasing pain and she decided to get checked out in the emergency room. Hip x-ray showed left femoral neck fracture.  S/p L hip hemiarthroplasty   PT Comments   Making good gains with functional mobility, amb, and activity tolerance; No dizziness today reported  Follow Up Recommendations  CIR     Does the patient have the potential to tolerate intense rehabilitation     Barriers to Discharge        Equipment Recommendations  None recommended by PT    Recommendations for Other Services    Frequency Min 5X/week   Progress towards PT Goals Progress towards PT goals: Progressing toward goals  Plan Current plan remains appropriate    Precautions / Restrictions Precautions Precautions: Posterior Hip;Fall Precaution Booklet Issued: Yes (comment) Precaution Comments: pt. aware of precautions but can not recall any of them. reviewed them with hand out Restrictions LLE Weight Bearing: Weight bearing as tolerated   Pertinent Vitals/Pain Occasional grimace with bed mobility, but pt denied pain when asked    Mobility  Bed Mobility Bed Mobility: Sit to Supine;Supine to Sit Supine to Sit: 3: Mod assist Sitting - Scoot to Edge of Bed: 3: Mod assist Sit to Supine: HOB flat;3: Mod assist Details for Bed Mobility Assistance: Cues for technique; physical assist for LLE into bed; Handheld  assist for lifting trunk from bed; Cues for prec, including tactile cueing to prevent IR Transfers Transfers: Sit to Stand;Stand to Sit Sit to Stand: With armrests;From chair/3-in-1;With upper extremity assist;4: Min guard Stand to Sit: With armrests;With upper extremity assist;To chair/3-in-1;4: Min guard Details for Transfer Assistance: Cues for LLE positioning for precautions, hand placement and safety Ambulation/Gait Ambulation/Gait Assistance: 4: Min guard Ambulation Distance (Feet): 50 Feet Assistive device: Rolling walker Ambulation/Gait Assistance Details: Required cues for prec, especially with turns; No dizziness reported (blood was being transfused during session) Gait Pattern: Step-to pattern;Step-through pattern (emerging step-through)    Exercises Total Joint Exercises Quad Sets: AROM;Left;10 reps;Supine Gluteal Sets: AROM;Both;10 reps;Supine Towel Squeeze: AROM;Both;10 reps;Supine Heel Slides: AROM;Left;10 reps;Supine Hip ABduction/ADduction: AAROM;Left;10 reps;Supine Bridges: AROM;Both;10 reps;Supine   PT Diagnosis:    PT Problem List:   PT Treatment Interventions:     PT Goals (current goals can now be found in the care plan section) Acute Rehab PT Goals Patient Stated Goal: back to gardening Time For Goal Achievement: 06/01/13 Potential to Achieve Goals: Good  Visit Information  Last PT Received On: 05/28/13 Assistance Needed: +2 (second person for safety; hopefully +1 soon) History of Present Illness: 79 y.o. female has a past medical history significant for previous CVA with mild left-sided weakness, was able to live independently and do all her ADLs, and no other major medical problems, comes in with a chief complaint of left hip pain after she fell while working in her garden last night. She felt fine afterwards, and was able to walk, however this morning she noticed increasing  pain and she decided to get checked out in the emergency room. Hip x-ray showed left  femoral neck fracture.  S/p L hip hemiarthroplasty    Subjective Data  Subjective: Reports is bored; Happy to get up and walk; Seemed to enjoy talking about her son Patient Stated Goal: back to gardening   Cognition  Cognition Arousal/Alertness: Awake/alert Behavior During Therapy: WFL for tasks assessed/performed Overall Cognitive Status: Within Functional Limits for tasks assessed Memory: Decreased recall of precautions;Decreased short-term memory    Balance     End of Session PT - End of Session Equipment Utilized During Treatment: Gait belt Activity Tolerance: Patient tolerated treatment well Patient left: with call bell/phone within reach;in bed Nurse Communication: Mobility status   GP     Olen Pel [place], [place] [phone]  05/28/2013, 4:47 PM

## 2013-05-28 NOTE — Progress Notes (Signed)
Clinical Social Work Department  BRIEF PSYCHOSOCIAL ASSESSMENT  Patient:Jecenia TAJANAY HURLEY Account Number: [id number]  Admit date: 05/23/13 Clinical Social Worker Sabino Niemann, MSW Date/Time: 05/28/2013 2:30 PM Referred by: Physician Date Referred: 05/27/13 Referred for   SNF Placement   Other Referral:  Interview type: Patient and patient's son Other interview type: PSYCHOSOCIAL DATA  Living Status:Husband Admitted from facility:  Level of care:  Primary support name: Donzetta Starch Primary support relationship to patient: Son Degree of support available:  Strong and vested  CURRENT CONCERNS  Current Concerns   Post-Acute Placement   Other Concerns:  SOCIAL WORK ASSESSMENT / PLAN  CSW met with pt re: PT recommendation for CIR. However if CIR falls through patient is agreeable to SNF.    Pt lives alone  CSW explained placement process and answered questions.   Pt reports [facility]  as her preference    CSW completed FL2 and initiated SNF search.     Assessment/plan status: Information/Referral to [facility]  Other assessment/ plan:  Information/referral to community resources:  SNF     PATIENT'S/FAMILY'S RESPONSE TO PLAN OF CARE:  Pt  reports she is agreeable to [facility] SNF( if CIR is not an option) in order to increase strength and independence with mobility prior to returning home  Pt verbalized understanding of placement process and appreciation for CSW assist.   Sabino Niemann, MSW [phone]

## 2013-05-28 NOTE — Progress Notes (Signed)
Patient ID: Lindsey Hensley, female   DOB: 1922/09/11, 79 y.o.   MRN: [id number]     Subjective:  Patient reports pain as mild.  Patient is alert and states that she is ready to get out of the hospital.  Denies any CP or SOB   Objective:   VITALS:   Filed Vitals:   05/27/13 1200 05/27/13 1300 05/27/13 2033 05/28/13 0500  BP:  94/60 128/98 121/57  Pulse:  88 92 82  Temp:  99.1 F (37.3 C) 98.8 F (37.1 C) 98.6 F (37 C)  TempSrc:   Oral Oral  Resp: 18 16 18 18   Height:      Weight:      SpO2:  98% 96% 97%    ABD soft Sensation intact distally Dorsiflexion/Plantar flexion intact Incision: dressing C/D/I and no drainage   Lab Results  Component Value Date   WBC 11.2* 05/28/2013   HGB 7.7* 05/28/2013   HCT 22.7* 05/28/2013   MCV 89.7 05/28/2013   PLT 171 05/28/2013     Assessment/Plan: 4 Days Post-Op   Active Problems:   Hip fracture, left   History of CVA (cerebrovascular accident)   Advance diet Up with therapy WBAT ABLA Not symptomatic  Okay for SNF Continue plan per medicine   Haskel Khan 05/28/2013, 7:22 AM   Teryl Lucy, MD Cell [phone]

## 2013-05-29 ENCOUNTER — Encounter (HOSPITAL_COMMUNITY): Payer: Self-pay | Admitting: Orthopedic Surgery

## 2013-05-29 LAB — CBC
HCT: 28.8 % — ABNORMAL LOW (ref 36.0–46.0)
Hemoglobin: 9.6 g/dL — ABNORMAL LOW (ref 12.0–15.0)
MCH: 29.4 pg (ref 26.0–34.0)
MCV: 88.1 fL (ref 78.0–100.0)
RBC: 3.27 MIL/uL — ABNORMAL LOW (ref 3.87–5.11)

## 2013-05-29 LAB — TYPE AND SCREEN: Unit division: 0

## 2013-05-29 MED ORDER — HYDROCORTISONE 1 % EX LOTN: TOPICAL_LOTION | Freq: Two times a day (BID) | CUTANEOUS | Status: DC

## 2013-05-29 MED ORDER — HYDROCORTISONE 1 % EX CREA
TOPICAL_CREAM | Freq: Two times a day (BID) | CUTANEOUS | Status: DC
  Filled 2013-05-29: qty 28

## 2013-05-29 MED ORDER — METHOCARBAMOL 500 MG PO TABS: 500.0000 mg | ORAL_TABLET | Freq: Four times a day (QID) | ORAL | Status: DC | PRN

## 2013-05-29 MED ORDER — HYDROCORTISONE 1 % EX LOTN
TOPICAL_LOTION | Freq: Two times a day (BID) | CUTANEOUS | Status: DC
  Filled 2013-05-29: qty 118

## 2013-05-29 MED ORDER — HYDROCODONE-ACETAMINOPHEN 5-325 MG PO TABS: 1.0000 | ORAL_TABLET | Freq: Four times a day (QID) | ORAL | Status: DC | PRN

## 2013-05-29 MED ORDER — POLYETHYLENE GLYCOL 3350 17 G PO PACK: 17.0000 g | PACK | Freq: Every day | ORAL | Status: DC | PRN

## 2013-05-29 MED ORDER — BISACODYL 10 MG RE SUPP: 10.0000 mg | Freq: Every day | RECTAL | Status: DC | PRN

## 2013-05-29 MED ORDER — ENOXAPARIN SODIUM 40 MG/0.4ML ~~LOC~~ SOLN: 40.0000 mg | SUBCUTANEOUS | Status: DC

## 2013-05-29 MED ORDER — DIPHENHYDRAMINE HCL 25 MG PO CAPS: 25.0000 mg | ORAL_CAPSULE | Freq: Three times a day (TID) | ORAL | Status: DC | PRN

## 2013-05-29 MED ORDER — ASPIRIN 325 MG PO TABS: 325.0000 mg | ORAL_TABLET | Freq: Every day | ORAL | Status: DC

## 2013-05-29 NOTE — Progress Notes (Addendum)
Occupational Therapy Treatment Patient Details Name: Lindsey Hensley MRN: [id number] DOB: 06/27/1922 Today's Date: 05/29/2013 Time: 4098-1191 OT Time Calculation (min): 32 min  OT Assessment / Plan / Recommendation  History of present illness 79 y.o. female has a past medical history significant for previous CVA with mild left-sided weakness, was able to live independently and do all her ADLs, and no other major medical problems, comes in with a chief complaint of left hip pain after she fell while working in her garden last night. She felt fine afterwards, and was able to walk, however this morning she noticed increasing pain and she decided to get checked out in the emergency room. Hip x-ray showed left femoral neck fracture.  S/p L hip hemiarthroplasty   OT comments  Pt ambulated to bathroom to perform toileting tasks. OT educated on AE for LB ADLs and pt practiced donning/doffing socks and underwear. Cues to maintain precautions during session.   Follow Up Recommendations  SNF;Supervision/Assistance - 24 hour    Barriers to Discharge       Equipment Recommendations       Recommendations for Other Services    Frequency Min 2X/week   Progress towards OT Goals Progress towards OT goals: Progressing toward goals  Plan Discharge plan needs to be updated    Precautions / Restrictions Precautions Precautions: Posterior Hip;Fall Precaution Booklet Issued: Yes (comment) Precaution Comments: could not state any precautions when asked Restrictions Weight Bearing Restrictions: No LLE Weight Bearing: Weight bearing as tolerated   Pertinent Vitals/Pain Pain in left hip, but did not rate. Repositioned.     ADL  Grooming: Performed;Wash/dry hands;Min guard Where Assessed - Grooming: Unsupported standing;Supported standing Lower Body Dressing: Minimal assistance Where Assessed - Lower Body Dressing: Supported sit to Pharmacist, hospital: Performed;Minimal Dentist  Method: Sit to Barista: Raised toilet seat with arms (or 3-in-1 over toilet) Toileting - Clothing Manipulation and Hygiene: Min guard Where Assessed - Toileting Clothing Manipulation and Hygiene: Sit to stand from 3-in-1 or toilet Equipment Used: Gait belt;Rolling walker;Sock aid;Reacher;Long-handled sponge;Long-handled shoe horn Transfers/Ambulation Related to ADLs: Min guard for ambulation with cues for walker safety and technique. Min A/Min guard for transfers. ADL Comments: Pt donned/doffed socks with AE and also donned panties with reacher. Overall Min A level.Cues to maintain precautions during session.      OT Diagnosis:    OT Problem List:   OT Treatment Interventions:     OT Goals(current goals can now be found in the care plan section) Acute Rehab OT Goals Patient Stated Goal: go home OT Goal Formulation: With patient Time For Goal Achievement: 06/01/13 Potential to Achieve Goals: Good ADL Goals Pt Will Perform Grooming: with supervision;standing Pt Will Perform Lower Body Bathing: with min guard assist;with adaptive equipment;sit to/from stand Pt Will Perform Lower Body Dressing: with min guard assist;with adaptive equipment;sit to/from stand Pt Will Transfer to Toilet: with supervision;ambulating (3 in 1 over commode) Pt Will Perform Toileting - Clothing Manipulation and hygiene: with min guard assist;sit to/from stand Additional ADL Goal #1: Pt will be at supervision level for bed mobility while maintaining hip precautions as precursor for ADLs.   Visit Information  Last OT Received On: 05/29/13 Assistance Needed: +1 History of Present Illness: 79 y.o. female has a past medical history significant for previous CVA with mild left-sided weakness, was able to live independently and do all her ADLs, and no other major medical problems, comes in with a chief complaint of left hip pain  after she fell while working in her garden last night. She felt fine  afterwards, and was able to walk, however this morning she noticed increasing pain and she decided to get checked out in the emergency room. Hip x-ray showed left femoral neck fracture.  S/p L hip hemiarthroplasty    Subjective Data      Prior Functioning       Cognition  Cognition Arousal/Alertness: Awake/alert Behavior During Therapy: WFL for tasks assessed/performed Overall Cognitive Status: Within Functional Limits for tasks assessed Memory: Decreased recall of precautions;Decreased short-term memory    Mobility  Bed Mobility Bed Mobility: Supine to Sit;Sitting - Scoot to Edge of Bed Supine to Sit: 4: Min assist;With rails;HOB elevated Sitting - Scoot to [place] of Bed: 4: Min assist Details for Bed Mobility Assistance: cues for technique; assisted with LE's and also to prevent IR of hip. Min A to scoot to EOB Transfers Transfers: Sit to Stand;Stand to Sit Sit to Stand: 4: Min guard;4: Min assist;With upper extremity assist;From bed;From chair/3-in-1 Stand to Sit: 4: Min guard;4: Min assist;With upper extremity assist;To chair/3-in-1 Details for Transfer Assistance: Cues for LLE positioning during transfers and also for hand placement and technique. Min A for sit to stand from bed and 3 in 1; Min guard for sit to stand from recliner chair. Min guard/ Min A for stand to sit transfers.     Exercises      Balance     End of Session OT - End of Session Equipment Utilized During Treatment: Gait belt;Rolling walker Activity Tolerance: Patient tolerated treatment well Patient left: in chair;with call bell/phone within reach  GO     Earlie Raveling OTR/L [phone] 05/29/2013, 1:59 PM

## 2013-05-29 NOTE — Progress Notes (Addendum)
Pt's insurance denies coverage for CIR.  Insurance will cover SNF or HH.  Informed pt's SW, Amy.  CIR will sign off.  [phone]

## 2013-05-29 NOTE — Discharge Summary (Signed)
Physician Discharge Summary  Patient ID: Lindsey Hensley MRN: [id number] DOB/AGE: 77/13/1923 77 y.o.  Admit date: 05/23/2013 Discharge date: 05/29/2013  Primary Care Physician:  Lindsey Baars, MD  Discharge Diagnoses:   Left hip fracture Mechanical fall History of CVA Anemia- likely acute blood loss anemia postoperatively and dilutional  Consults: Orthopedics, Dr. Dorthula Nettles  Recommendations for Outpatient Follow-up:  Up with therapy  WBAT  Dry dressing PRN Lovenox 40 mg subcutaneous daily for 3 weeks for DVT prophylaxis    Allergies:   Allergies  Allergen Reactions  . Sulfa Antibiotics Other (See Comments)    unknown     Discharge Medications:   Medication List    STOP taking these medications       POTASSIUM PO      TAKE these medications       aspirin 325 MG tablet  Take 1 tablet (325 mg total) by mouth daily.     bisacodyl 10 MG suppository  Commonly known as:  DULCOLAX  Place 1 suppository (10 mg total) rectally daily as needed.     Calcium-Vitamin D 600-400 MG-UNIT Tabs  Take 1 tablet by mouth daily.     diphenhydrAMINE 25 mg capsule  Commonly known as:  BENADRYL  Take 1 capsule (25 mg total) by mouth every 8 (eight) hours as needed for itching.     enoxaparin 40 MG/0.4ML injection  Commonly known as:  LOVENOX  Inject 0.4 mL (40 mg total) into the skin daily. X 3 weeks post op     HYDROcodone-acetaminophen 5-325 MG per tablet  Commonly known as:  NORCO  Take 1-2 tablets by mouth every 6 (six) hours as needed for pain. MAXIMUM TOTAL ACETAMINOPHEN DOSE IS 4000 MG PER DAY     hydrocortisone 1 % lotion  Apply topically 2 (two) times daily. Apply to back until rash resolves     methocarbamol 500 MG tablet  Commonly known as:  ROBAXIN  Take 1 tablet (500 mg total) by mouth every 6 (six) hours as needed.     multivitamin with minerals Tabs tablet  Take 1 tablet by mouth daily.     polyethylene glycol packet  Commonly known as:  MIRALAX /  GLYCOLAX  Take 17 g by mouth daily as needed.     Rewetting Drops Soln  2 drops by Does not apply route every evening. For dry eyes     sennosides-docusate sodium 8.6-50 MG tablet  Commonly known as:  SENOKOT-S  Take 2 tablets by mouth daily.     vitamin C 500 MG tablet  Commonly known as:  ASCORBIC ACID  Take 500 mg by mouth daily.         Brief H and P: For complete details please refer to admission H and P, but in brief Lindsey Hensley is a 79 y.o. female has a past medical history significant for previous CVA with mild left-sided weakness, was able to live independently and do all her ADLs, and no other major medical problems, presented initially to [facility] with left hip pain after she fell while working in her garden a night before the admission.. She felt fine afterwards, and was able to walk, however on the morning of admission, she noticed increasing pain and she decided to get checked out in the emergency room. Hip x-ray showed left femoral neck fracture. Orthopedic surgery was consulted and recommended transferring patient to [facility] for surgery. Patient denied any fever or chills, chest pain or breathing  difficulties, lightheadedness or dizziness. She denied any loss of consciousness with the fall.   Hospital Course:  Patient is 79 year old female with no significant past medical history except previous CVA was admitted with left femoral neck fracture. Orthopedics was consulted. Left Hip fracture - status post ground-level fall, without loss of consciousness. Patient underwent left hip arthroplasty on 05/24/2013. PT OT consult was obtained and patient is recommended skilled nursing facility for rehabilitation she was cleared by orthopedics for discharge. Patient will continue Lovenox for 3 weeks for DVT prophylaxis.    History of CVA - very mild, if any left-sided weakness. Stable.  cont aspirin 325 mg daily   Leukocytosis - trending down, afebrile. WBC  11.5 at dc. UA  was negative for any UTI, chest x-ray negative for any. Patient remained afebrile and blood cultures remained negative.  Anemia: Likely dilutional and postop, Hb 7.7, transfused 1 unit packed RBC on 8/19,  hemoglobin 9.6 today    Day of Discharge BP 126/76  Pulse 78  Temp(Src) 99 F (37.2 C) (Oral)  Resp 16  Ht 5' 5.5" (1.664 m)  Wt 55.339 kg (122 lb)  BMI 19.99 kg/m2  SpO2 97%  Physical Exam: General: Alert and awake oriented x3 not in any acute distress. CVS: S1-S2 clear no murmur rubs or gallops Chest: clear to auscultation bilaterally, no wheezing rales or rhonchi Abdomen: soft nontender, nondistended, normal bowel sounds Extremities: no cyanosis, clubbing or edema noted bilaterally    The results of significant diagnostics from this hospitalization (including imaging, microbiology, ancillary and laboratory) are listed below for reference.    LAB RESULTS: Basic Metabolic Panel:  Recent Labs Lab 05/25/13 0600 05/26/13 0702  NA 132* 131*  K 3.6 3.8  CL 100 101  CO2 23 21  GLUCOSE 114* 110*  BUN 17 15  CREATININE 1.10 1.05  CALCIUM 8.2* 8.1*  CBC:  Recent Labs Lab 05/23/13 1130  05/28/13 0430 05/28/13 1641 05/29/13 0658  WBC 15.0*  < > 11.2*  --  11.5*  NEUTROABS 9.4*  --   --   --   --   HGB 10.8*  < > 7.7* 9.5* 9.6*  HCT 33.5*  < > 22.7* 28.1* 28.8*  MCV 91.5  < > 89.7  --  88.1  PLT 153  < > 171  --  219  < > = values in this interval not displayed. Cardiac Enzymes:  Recent Labs Lab 05/23/13 1130  TROPONINI <0.30   BNP: No components found with this basename: POCBNP,  CBG: No results found for this basename: GLUCAP,  in the last 168 hours  Significant Diagnostic Studies:  Dg Hip Complete Left  05/23/2013   *RADIOLOGY REPORT*  Clinical Data: Fall with left hip pain.  LEFT HIP - COMPLETE 2+ VIEW  Comparison: None.  Findings: There is a displaced fracture of the left femoral neck with superior displacement of the distal fracture  fragment. Femoral head remains located in its acetabulum.  Right hip is unremarkable.  IMPRESSION: Left femoral neck fracture.   Original Report Authenticated By: Leanna Battles, M.D.   Dg Pelvis Portable  05/24/2013   *RADIOLOGY REPORT*  Clinical Data: Left hip replacement.  PORTABLE PELVIS,PORTABLE LEFT HIP - 1 VIEW  Comparison: 05/23/2013  Findings: Total left hip prosthesis appears in satisfactory position without complication noted.  Suggestion of calcified uterine fibroid.  IMPRESSION: Left total hip prosthesis appears in satisfactory position.   Original Report Authenticated By: Lacy Duverney, M.D.   Dg Chest [facility]  05/23/2013   *RADIOLOGY REPORT*  Clinical Data: Fall, weakness  PORTABLE CHEST - 1 VIEW  Comparison: Feb 25, 2011.  Findings: Cardiomediastinal silhouette appears normal.  No acute pulmonary disease is noted.  No pleural effusion or pneumothorax is noted.  Bony thorax is intact.  IMPRESSION: No acute cardiopulmonary abnormality seen.   Original Report Authenticated By: Lupita Raider.,  M.D.   Dg Hip Portable 1 View Left  05/24/2013   *RADIOLOGY REPORT*  Clinical Data: Left hip replacement.  PORTABLE PELVIS,PORTABLE LEFT HIP - 1 VIEW  Comparison: 05/23/2013  Findings: Total left hip prosthesis appears in satisfactory position without complication noted.  Suggestion of calcified uterine fibroid.  IMPRESSION: Left total hip prosthesis appears in satisfactory position.   Original Report Authenticated By: Lacy Duverney, M.D.       Disposition and Follow-up: Discharge Orders   Future Orders Complete By Expires   Diet - low sodium heart healthy  As directed    Increase activity slowly  As directed    Posterior total hip precautions  As directed    Weight bearing as tolerated  As directed        DISPOSITION: Skilled nursing facility  DIET: Heart healthy diet   ACTIVITYPhysical therapy    DISCHARGE FOLLOW-UP Follow-up Information   Follow up with Eulas Post, MD.  Schedule an appointment as soon as possible for a visit in 2 weeks.   Specialty:  Orthopedic Surgery   Contact information:   [address] [place] [address] [phone]       Follow up with Lindsey Baars, MD. Schedule an appointment as soon as possible for a visit in 2 weeks. (for follow-up)    Specialty:  Internal Medicine   Contact information:   [address] [facility] [place] [address] [phone]       Time spent on Discharge: 40 minutes   Signed:   Romone Shaff M.D. [facility] 05/29/2013, 11:34 AM Pager: [phone]

## 2013-05-29 NOTE — Progress Notes (Signed)
Physical Therapy Treatment Patient Details Name: Lindsey Hensley MRN: [id number] DOB: 1921/10/20 Today's Date: 05/29/2013 Time: 4098-1191 PT Time Calculation (min): 27 min  PT Assessment / Plan / Recommendation  History of Present Illness 79 y.o. female has a past medical history significant for previous CVA with mild left-sided weakness, was able to live independently and do all her ADLs, and no other major medical problems, comes in with a chief complaint of left hip pain after she fell while working in her garden last night. She felt fine afterwards, and was able to walk, however this morning she noticed increasing pain and she decided to get checked out in the emergency room. Hip x-ray showed left femoral neck fracture.  S/p L hip hemiarthroplasty   PT Comments   Continuing progress with ambulation and activity tolerance; Continues to require cues for hip prec Pans for dc to SNF today   Follow Up Recommendations  SNF     Does the patient have the potential to tolerate intense rehabilitation     Barriers to Discharge        Equipment Recommendations  None recommended by PT    Recommendations for Other Services    Frequency Min 5X/week   Progress towards PT Goals Progress towards PT goals: Progressing toward goals  Plan Current plan remains appropriate    Precautions / Restrictions Precautions Precautions: Posterior Hip;Fall Precaution Booklet Issued: Yes (comment) Precaution Comments: could not state any precautions when asked Restrictions Weight Bearing Restrictions: No LLE Weight Bearing: Weight bearing as tolerated   Pertinent Vitals/Pain Reports some pain L hip, "but not bad" Did not rate patient repositioned for comfort     Mobility  Bed Mobility Bed Mobility: Supine to Sit;Sitting - Scoot to [place] of Bed;Sit to Supine Supine to Sit: 4: Min assist;With rails;HOB elevated Sitting - Scoot to [place] of Bed: 4: Min assist Sit to Supine: 4: Min guard Details for Bed  Mobility Assistance: cues for technique; assisted with LE's and also to prevent IR of hip.  Transfers Transfers: Sit to Stand;Stand to Sit Sit to Stand: 4: Min guard;4: Min assist;With upper extremity assist;From bed;From chair/3-in-1 Stand to Sit: 4: Min guard;4: Min assist;With upper extremity assist;To chair/3-in-1 Details for Transfer Assistance: Cues for LLE positioning during transfers and also for hand placement and technique. Min A for sit to stand from bed and 3 in 1; Min guard for sit to stand from recliner chair. Min guard/ Min A for stand to sit transfers.  Ambulation/Gait Ambulation/Gait Assistance: 4: Min guard (without physical contact) Ambulation Distance (Feet): 60 Feet Assistive device: Rolling walker Ambulation/Gait Assistance Details: Cues for prec and upright posture Gait Pattern: Step-to pattern;Step-through pattern    Exercises     PT Diagnosis:    PT Problem List:   PT Treatment Interventions:     PT Goals (current goals can now be found in the care plan section) Acute Rehab PT Goals Patient Stated Goal: go home Time For Goal Achievement: 06/01/13 Potential to Achieve Goals: Good  Visit Information  Last PT Received On: 05/29/13 Assistance Needed: +1 History of Present Illness: 79 y.o. female has a past medical history significant for previous CVA with mild left-sided weakness, was able to live independently and do all her ADLs, and no other major medical problems, comes in with a chief complaint of left hip pain after she fell while working in her garden last night. She felt fine afterwards, and was able to walk, however this morning she noticed increasing pain  and she decided to get checked out in the emergency room. Hip x-ray showed left femoral neck fracture.  S/p L hip hemiarthroplasty    Subjective Data  Subjective: Reports is bored; Happy to get up and walk; Seemed to enjoy talking about [place] a farm Patient Stated Goal: go home   Cognition   Cognition Arousal/Alertness: Awake/alert Behavior During Therapy: WFL for tasks assessed/performed Overall Cognitive Status: Within Functional Limits for tasks assessed Memory: Decreased recall of precautions;Decreased short-term memory    Balance     End of Session PT - End of Session Equipment Utilized During Treatment: Gait belt Activity Tolerance: Patient tolerated treatment well Patient left: with call bell/phone within reach;in bed Nurse Communication: Mobility status   GP     Olen Pel Campbell, [place] [phone]  05/29/2013, 4:33 PM

## 2013-05-29 NOTE — Progress Notes (Signed)
Patient attempted to get OOB to go to bathroom without assistance.  Bed alarmed.  Patient assisted to BR with 1+ assist using walker.  Gait strong.  Cues for safety.  Continent of bladder and bowels.  Small BM.  Assisted back to bed.  SR up X3, bed low and locked, bed alarm on, call bell in reach and reinforced safety precautions.  Continue to monitor for safety and toileting needs.

## 2013-05-29 NOTE — Progress Notes (Signed)
Patient ID: Lindsey Hensley, female   DOB: [birth year], 79 y.o.   MRN: [id number]     Subjective:  Patient reports pain as mild she states that she is doing better today.  Objective:   VITALS:   Filed Vitals:   05/28/13 1345 05/28/13 1450 05/28/13 2220 05/29/13 0611  BP: 126/50 135/56 130/66 126/76  Pulse: 89 85 80 78  Temp: 99.1 F (37.3 C) 99.5 F (37.5 C) 99 F (37.2 C) 99 F (37.2 C)  TempSrc: Oral Oral Oral Oral  Resp: 18 16 16 16   Height:      Weight:      SpO2: 100%  99% 97%    ABD soft Sensation intact distally Dorsiflexion/Plantar flexion intact Incision: dressing C/D/I and no drainage   Lab Results  Component Value Date   WBC 11.2* 05/28/2013   HGB 9.5* 05/28/2013   HCT 28.1* 05/28/2013   MCV 89.7 05/28/2013   PLT 171 05/28/2013     Assessment/Plan: 5 Days Post-Op   Active Problems:   Hip fracture, left   History of CVA (cerebrovascular accident)   Advance diet Up with therapy Continue plan per medicine WBAT Okay for SNF from ortho standpoint Dry dressing PRN  Lindsey Hensley 05/29/2013, 6:53 AM   Teryl Lucy, MD Cell [phone]

## 2013-05-29 NOTE — Progress Notes (Signed)
Clinical social worker assisted with patient discharge to skilled nursing facility, [facility].  CSW addressed all family questions and concerns. CSW copied chart and added all important documents. CSW also set up patient transportation with [facility]. Clinical Social Worker will sign off for now as social work intervention is no longer needed.   Lindsey Hensley, MSW [phone] 

## 2013-05-30 LAB — CULTURE, BLOOD (ROUTINE X 2): Culture: NO GROWTH

## 2013-06-05 ENCOUNTER — Encounter (INDEPENDENT_AMBULATORY_CARE_PROVIDER_SITE_OTHER): Payer: Medicare HMO | Admitting: *Deleted

## 2013-06-05 ENCOUNTER — Encounter: Payer: Self-pay | Admitting: *Deleted

## 2013-06-05 DIAGNOSIS — M7989 Other specified soft tissue disorders: Secondary | ICD-10-CM

## 2013-11-24 ENCOUNTER — Encounter (HOSPITAL_COMMUNITY): Payer: Self-pay | Admitting: Emergency Medicine

## 2013-11-24 ENCOUNTER — Inpatient Hospital Stay (HOSPITAL_COMMUNITY)
Admission: EM | Admit: 2013-11-24 | Discharge: 2013-12-17 | DRG: 329 | Disposition: A | Discharge status: Skilled Nursing Facility | Payer: Medicare HMO | Attending: Internal Medicine | Admitting: Internal Medicine

## 2013-11-24 ENCOUNTER — Observation Stay (HOSPITAL_COMMUNITY): Payer: Medicare HMO

## 2013-11-24 ENCOUNTER — Emergency Department (HOSPITAL_COMMUNITY): Payer: Medicare HMO

## 2013-11-24 DIAGNOSIS — M4120 Other idiopathic scoliosis, site unspecified: Secondary | ICD-10-CM | POA: Diagnosis present

## 2013-11-24 DIAGNOSIS — E860 Dehydration: Secondary | ICD-10-CM | POA: Diagnosis present

## 2013-11-24 DIAGNOSIS — G309 Alzheimer's disease, unspecified: Secondary | ICD-10-CM | POA: Diagnosis present

## 2013-11-24 DIAGNOSIS — E43 Unspecified severe protein-calorie malnutrition: Secondary | ICD-10-CM | POA: Diagnosis present

## 2013-11-24 DIAGNOSIS — R3989 Other symptoms and signs involving the genitourinary system: Secondary | ICD-10-CM | POA: Diagnosis present

## 2013-11-24 DIAGNOSIS — R1115 Cyclical vomiting syndrome unrelated to migraine: Secondary | ICD-10-CM | POA: Diagnosis present

## 2013-11-24 DIAGNOSIS — K625 Hemorrhage of anus and rectum: Secondary | ICD-10-CM | POA: Diagnosis not present

## 2013-11-24 DIAGNOSIS — R269 Unspecified abnormalities of gait and mobility: Secondary | ICD-10-CM | POA: Diagnosis present

## 2013-11-24 DIAGNOSIS — R112 Nausea with vomiting, unspecified: Secondary | ICD-10-CM | POA: Diagnosis present

## 2013-11-24 DIAGNOSIS — K921 Melena: Secondary | ICD-10-CM | POA: Diagnosis not present

## 2013-11-24 DIAGNOSIS — Z860100 Personal history of colon polyps, unspecified: Secondary | ICD-10-CM

## 2013-11-24 DIAGNOSIS — Z8673 Personal history of transient ischemic attack (TIA), and cerebral infarction without residual deficits: Secondary | ICD-10-CM

## 2013-11-24 DIAGNOSIS — R7301 Impaired fasting glucose: Secondary | ICD-10-CM | POA: Diagnosis present

## 2013-11-24 DIAGNOSIS — M810 Age-related osteoporosis without current pathological fracture: Secondary | ICD-10-CM | POA: Diagnosis present

## 2013-11-24 DIAGNOSIS — K929 Disease of digestive system, unspecified: Secondary | ICD-10-CM | POA: Diagnosis present

## 2013-11-24 DIAGNOSIS — R5381 Other malaise: Secondary | ICD-10-CM | POA: Diagnosis present

## 2013-11-24 DIAGNOSIS — I129 Hypertensive chronic kidney disease with stage 1 through stage 4 chronic kidney disease, or unspecified chronic kidney disease: Secondary | ICD-10-CM | POA: Diagnosis present

## 2013-11-24 DIAGNOSIS — Z8249 Family history of ischemic heart disease and other diseases of the circulatory system: Secondary | ICD-10-CM

## 2013-11-24 DIAGNOSIS — Z7982 Long term (current) use of aspirin: Secondary | ICD-10-CM

## 2013-11-24 DIAGNOSIS — R630 Anorexia: Secondary | ICD-10-CM | POA: Diagnosis present

## 2013-11-24 DIAGNOSIS — F0280 Dementia in other diseases classified elsewhere without behavioral disturbance: Secondary | ICD-10-CM | POA: Diagnosis present

## 2013-11-24 DIAGNOSIS — D638 Anemia in other chronic diseases classified elsewhere: Secondary | ICD-10-CM | POA: Diagnosis present

## 2013-11-24 DIAGNOSIS — I69959 Hemiplegia and hemiparesis following unspecified cerebrovascular disease affecting unspecified side: Secondary | ICD-10-CM

## 2013-11-24 DIAGNOSIS — IMO0002 Reserved for concepts with insufficient information to code with codable children: Secondary | ICD-10-CM

## 2013-11-24 DIAGNOSIS — K56609 Unspecified intestinal obstruction, unspecified as to partial versus complete obstruction: Principal | ICD-10-CM | POA: Diagnosis present

## 2013-11-24 DIAGNOSIS — K449 Diaphragmatic hernia without obstruction or gangrene: Secondary | ICD-10-CM | POA: Diagnosis present

## 2013-11-24 DIAGNOSIS — Z79899 Other long term (current) drug therapy: Secondary | ICD-10-CM

## 2013-11-24 DIAGNOSIS — M19049 Primary osteoarthritis, unspecified hand: Secondary | ICD-10-CM | POA: Diagnosis present

## 2013-11-24 DIAGNOSIS — Z87891 Personal history of nicotine dependence: Secondary | ICD-10-CM

## 2013-11-24 DIAGNOSIS — K560 Paralytic ileus: Secondary | ICD-10-CM | POA: Diagnosis present

## 2013-11-24 DIAGNOSIS — N183 Chronic kidney disease, stage 3 unspecified: Secondary | ICD-10-CM | POA: Diagnosis present

## 2013-11-24 DIAGNOSIS — D72829 Elevated white blood cell count, unspecified: Secondary | ICD-10-CM | POA: Diagnosis present

## 2013-11-24 DIAGNOSIS — N179 Acute kidney failure, unspecified: Secondary | ICD-10-CM | POA: Diagnosis present

## 2013-11-24 DIAGNOSIS — I69354 Hemiplegia and hemiparesis following cerebral infarction affecting left non-dominant side: Secondary | ICD-10-CM

## 2013-11-24 DIAGNOSIS — Z881 Allergy status to other antibiotic agents status: Secondary | ICD-10-CM

## 2013-11-24 DIAGNOSIS — Z5331 Laparoscopic surgical procedure converted to open procedure: Secondary | ICD-10-CM

## 2013-11-24 DIAGNOSIS — F05 Delirium due to known physiological condition: Secondary | ICD-10-CM

## 2013-11-24 DIAGNOSIS — R5383 Other fatigue: Secondary | ICD-10-CM

## 2013-11-24 DIAGNOSIS — K631 Perforation of intestine (nontraumatic): Secondary | ICD-10-CM | POA: Diagnosis present

## 2013-11-24 DIAGNOSIS — N189 Chronic kidney disease, unspecified: Secondary | ICD-10-CM

## 2013-11-24 DIAGNOSIS — Z9089 Acquired absence of other organs: Secondary | ICD-10-CM

## 2013-11-24 DIAGNOSIS — K219 Gastro-esophageal reflux disease without esophagitis: Secondary | ICD-10-CM | POA: Diagnosis present

## 2013-11-24 DIAGNOSIS — R404 Transient alteration of awareness: Secondary | ICD-10-CM | POA: Diagnosis present

## 2013-11-24 DIAGNOSIS — Z66 Do not resuscitate: Secondary | ICD-10-CM | POA: Diagnosis present

## 2013-11-24 DIAGNOSIS — D72823 Leukemoid reaction: Secondary | ICD-10-CM | POA: Diagnosis present

## 2013-11-24 DIAGNOSIS — Z96649 Presence of unspecified artificial hip joint: Secondary | ICD-10-CM

## 2013-11-24 LAB — COMPREHENSIVE METABOLIC PANEL
ALT: 8 U/L (ref 0–35)
AST: 21 U/L (ref 0–37)
Albumin: 3.8 g/dL (ref 3.5–5.2)
Alkaline Phosphatase: 57 U/L (ref 39–117)
BILIRUBIN TOTAL: 1.4 mg/dL — AB (ref 0.3–1.2)
BUN: 32 mg/dL — ABNORMAL HIGH (ref 6–23)
CHLORIDE: 94 meq/L — AB (ref 96–112)
CO2: 30 meq/L (ref 19–32)
CREATININE: 1.5 mg/dL — AB (ref 0.50–1.10)
Calcium: 10.3 mg/dL (ref 8.4–10.5)
GFR calc Af Amer: 34 mL/min — ABNORMAL LOW (ref >90)
GFR, EST NON AFRICAN AMERICAN: 30 mL/min — AB (ref >90)
Glucose, Bld: 165 mg/dL — ABNORMAL HIGH (ref 70–99)
Potassium: 4.1 mEq/L (ref 3.7–5.3)
Sodium: 140 mEq/L (ref 137–147)
Total Protein: 7.9 g/dL (ref 6.0–8.3)

## 2013-11-24 LAB — CBC WITH DIFFERENTIAL/PLATELET
BASOS ABS: 0 10*3/uL (ref 0.0–0.1)
Basophils Relative: 0 % (ref 0–1)
EOS PCT: 0 % (ref 0–5)
Eosinophils Absolute: 0 10*3/uL (ref 0.0–0.7)
HCT: 42.6 % (ref 36.0–46.0)
Hemoglobin: 13.9 g/dL (ref 12.0–15.0)
LYMPHS ABS: 2.4 10*3/uL (ref 0.7–4.0)
Lymphocytes Relative: 8 % — ABNORMAL LOW (ref 12–46)
MCH: 30.2 pg (ref 26.0–34.0)
MCHC: 32.6 g/dL (ref 30.0–36.0)
MCV: 92.4 fL (ref 78.0–100.0)
Monocytes Absolute: 4.9 10*3/uL — ABNORMAL HIGH (ref 0.1–1.0)
Monocytes Relative: 16 % — ABNORMAL HIGH (ref 3–12)
NEUTROS ABS: 23.1 10*3/uL — AB (ref 1.7–7.7)
Neutrophils Relative %: 76 % (ref 43–77)
PLATELETS: 210 10*3/uL (ref 150–400)
RBC: 4.61 MIL/uL (ref 3.87–5.11)
RDW: 14.5 % (ref 11.5–15.5)
WBC: 30.4 10*3/uL — ABNORMAL HIGH (ref 4.0–10.5)

## 2013-11-24 LAB — URINALYSIS, ROUTINE W REFLEX MICROSCOPIC
Glucose, UA: NEGATIVE mg/dL
KETONES UR: NEGATIVE mg/dL
NITRITE: NEGATIVE
PROTEIN: 30 mg/dL — AB
Specific Gravity, Urine: 1.027 (ref 1.005–1.030)
UROBILINOGEN UA: 1 mg/dL (ref 0.0–1.0)
pH: 5.5 (ref 5.0–8.0)

## 2013-11-24 LAB — URINE MICROSCOPIC-ADD ON

## 2013-11-24 LAB — LIPASE, BLOOD: LIPASE: 12 U/L (ref 11–59)

## 2013-11-24 LAB — POCT I-STAT TROPONIN I: TROPONIN I, POC: 0 ng/mL (ref 0.00–0.08)

## 2013-11-24 MED ORDER — ACETAMINOPHEN 325 MG PO TABS
650.0000 mg | ORAL_TABLET | Freq: Four times a day (QID) | ORAL | Status: DC | PRN
  Administered 2013-12-10 – 2013-12-17 (×3): 650 mg via ORAL
  Filled 2013-11-24 (×4): qty 2

## 2013-11-24 MED ORDER — ADULT MULTIVITAMIN W/MINERALS CH
1.0000 | ORAL_TABLET | Freq: Every day | ORAL | Status: DC
  Administered 2013-11-24 – 2013-11-26 (×3): 1 via ORAL
  Filled 2013-11-24 (×7): qty 1

## 2013-11-24 MED ORDER — ASPIRIN 325 MG PO TABS
325.0000 mg | ORAL_TABLET | Freq: Every day | ORAL | Status: DC
  Administered 2013-11-24 – 2013-11-26 (×3): 325 mg via ORAL
  Filled 2013-11-24 (×4): qty 1

## 2013-11-24 MED ORDER — PANTOPRAZOLE SODIUM 40 MG IV SOLR
40.0000 mg | INTRAVENOUS | Status: AC
  Administered 2013-11-24: 40 mg via INTRAVENOUS
  Filled 2013-11-24: qty 40

## 2013-11-24 MED ORDER — ONDANSETRON 8 MG PO TBDP
8.0000 mg | ORAL_TABLET | Freq: Once | ORAL | Status: AC
  Administered 2013-11-24: 8 mg via ORAL
  Filled 2013-11-24: qty 1

## 2013-11-24 MED ORDER — SENNOSIDES-DOCUSATE SODIUM 8.6-50 MG PO TABS
2.0000 | ORAL_TABLET | Freq: Every day | ORAL | Status: DC
  Administered 2013-11-24 – 2013-11-26 (×3): 2 via ORAL
  Filled 2013-11-24 (×7): qty 2

## 2013-11-24 MED ORDER — SODIUM CHLORIDE 0.9 % IV BOLUS (SEPSIS)
500.0000 mL | Freq: Once | INTRAVENOUS | Status: AC
  Administered 2013-11-24: 500 mL via INTRAVENOUS

## 2013-11-24 MED ORDER — POLYETHYL GLYCOL-PROPYL GLYCOL 0.4-0.3 % OP SOLN: 2.0000 [drp] | Freq: Every day | OPHTHALMIC | Status: DC | PRN

## 2013-11-24 MED ORDER — ENOXAPARIN SODIUM 30 MG/0.3ML ~~LOC~~ SOLN
30.0000 mg | SUBCUTANEOUS | Status: DC
  Administered 2013-11-24 – 2013-11-28 (×5): 30 mg via SUBCUTANEOUS
  Filled 2013-11-24 (×7): qty 0.3

## 2013-11-24 MED ORDER — HYDROCODONE-ACETAMINOPHEN 5-325 MG PO TABS
1.0000 | ORAL_TABLET | ORAL | Status: DC | PRN
  Administered 2013-11-26: 2 via ORAL
  Administered 2013-12-14 – 2013-12-15 (×4): 1 via ORAL
  Filled 2013-11-24 (×4): qty 1
  Filled 2013-11-24: qty 2

## 2013-11-24 MED ORDER — IOHEXOL 300 MG/ML  SOLN
50.0000 mL | Freq: Once | INTRAMUSCULAR | Status: AC | PRN
  Administered 2013-11-24: 50 mL via ORAL

## 2013-11-24 MED ORDER — POLYVINYL ALCOHOL 1.4 % OP SOLN
2.0000 [drp] | OPHTHALMIC | Status: DC | PRN
  Filled 2013-11-24: qty 15

## 2013-11-24 MED ORDER — PANTOPRAZOLE SODIUM 40 MG IV SOLR
40.0000 mg | INTRAVENOUS | Status: DC
Start: 2013-11-25 — End: 2013-12-10
  Administered 2013-11-25 – 2013-12-09 (×15): 40 mg via INTRAVENOUS
  Filled 2013-11-24 (×17): qty 40

## 2013-11-24 MED ORDER — SODIUM CHLORIDE 0.9 % IV SOLN
INTRAVENOUS | Status: DC
Start: 2013-11-24 — End: 2013-11-27
  Administered 2013-11-24: 17:00:00 via INTRAVENOUS
  Administered 2013-11-25: 1000 mL via INTRAVENOUS
  Administered 2013-11-26 – 2013-11-27 (×2): via INTRAVENOUS

## 2013-11-24 MED ORDER — ACETAMINOPHEN 650 MG RE SUPP
650.0000 mg | Freq: Four times a day (QID) | RECTAL | Status: DC | PRN
  Administered 2013-11-25: 650 mg via RECTAL
  Filled 2013-11-24: qty 1

## 2013-11-24 MED ORDER — SODIUM CHLORIDE 0.9 % IV SOLN
1.5000 g | Freq: Two times a day (BID) | INTRAVENOUS | Status: DC
  Administered 2013-11-24 – 2013-11-25 (×2): 1.5 g via INTRAVENOUS
  Filled 2013-11-24 (×2): qty 1.5

## 2013-11-24 NOTE — ED Provider Notes (Addendum)
CSN: [id number]     Arrival date & time 11/24/13  0910 History   First MD Initiated Contact with Patient 11/24/13 1019     Chief Complaint  Patient presents with  . Emesis  . Dizziness  . Abdominal Pain   Level 5 caveat due to altered mental status/dementia  (Consider location/radiation/quality/duration/timing/severity/associated sxs/prior Treatment) Patient is a 80 y.o. female presenting with vomiting, dizziness, and abdominal pain. The history is provided by the patient.  Emesis Associated symptoms: abdominal pain   Dizziness Associated symptoms: vomiting   Abdominal Pain Associated symptoms: vomiting    patient was brought in by family members with nausea and vomiting. Apparently has had decreased oral intake the last couple days. Has not and appetite are been eating her meals. Had had some complaints of upper abdominal pain. Unknown if she's had diarrhea. She's had green vomitus. She lives at home, however a family member who is a caregiver. Patient is without complaints. Patient's family member states that the patient has had dark, foul-smelling, cloudy urine.  Past Medical History  Diagnosis Date  . Stroke   . GERD (gastroesophageal reflux disease)   . Arthritis     hands   Past Surgical History  Procedure Laterality Date  . Cholecystectomy    . Ectopic pregnancy surgery    . Hip arthroplasty Left 05/24/2013    Procedure: ARTHROPLASTY UNIPOLAR HIP;  Surgeon: Eulas PostJoshua P Landau, MD;  [facility];  Service: Orthopedics;  Laterality: Left;   No family history on file. History  Substance Use Topics  . Smoking status: Former Games developermoker  . Smokeless tobacco: Never Used     Comment: " I quit along time ago "  . Alcohol Use: Yes     Comment: occasional   OB History   Grav Para Term Preterm Abortions TAB SAB Ect Mult Living                 Review of Systems  Unable to perform ROS Gastrointestinal: Positive for vomiting and abdominal pain.  Neurological: Positive for  dizziness.      Allergies  Sulfa antibiotics  Home Medications   Current Outpatient Rx  Name  Route  Sig  Dispense  Refill  . aspirin 325 MG tablet   Oral   Take 1 tablet (325 mg total) by mouth daily.         . calcium carbonate (TUMS - DOSED IN MG ELEMENTAL CALCIUM) 500 MG chewable tablet   Oral   Chew 1 tablet by mouth daily as needed for indigestion or heartburn.         . Multiple Vitamin (MULTIVITAMIN WITH MINERALS) TABS tablet   Oral   Take 1 tablet by mouth daily.         Bertram Gala. Polyethyl Glycol-Propyl Glycol (SYSTANE) 0.4-0.3 % SOLN   Both Eyes   Place 2 drops into both eyes daily as needed (dry eyes).         . sennosides-docusate sodium (SENOKOT-S) 8.6-50 MG tablet   Oral   Take 2 tablets by mouth daily.   30 tablet   1   . vitamin C (ASCORBIC ACID) 500 MG tablet   Oral   Take 500 mg by mouth daily.          BP 140/61  Pulse 79  Temp(Src) 99 F (37.2 C) (Oral)  Resp 16  SpO2 90% Physical Exam  Constitutional: She appears well-developed.  HENT:  Head: Normocephalic.  Cardiovascular: Normal rate and regular rhythm.  Pulmonary/Chest: Effort normal and breath sounds normal.  Abdominal: Soft.  Musculoskeletal: She exhibits no edema.  Neurological: She is alert.  Mild confusion    ED Course  Procedures (including critical care time) Labs Review Labs Reviewed  CBC WITH DIFFERENTIAL - Abnormal; Notable for the following:    WBC 30.4 (*)    Lymphocytes Relative 8 (*)    Monocytes Relative 16 (*)    Neutro Abs 23.1 (*)    Monocytes Absolute 4.9 (*)    All other components within normal limits  COMPREHENSIVE METABOLIC PANEL - Abnormal; Notable for the following:    Chloride 94 (*)    Glucose, Bld 165 (*)    BUN 32 (*)    Creatinine, Ser 1.50 (*)    Total Bilirubin 1.4 (*)    GFR calc non Af Amer 30 (*)    GFR calc Af Amer 34 (*)    All other components within normal limits  URINALYSIS, ROUTINE W REFLEX MICROSCOPIC - Abnormal; Notable  for the following:    Color, Urine AMBER (*)    APPearance CLOUDY (*)    Hgb urine dipstick TRACE (*)    Bilirubin Urine SMALL (*)    Protein, ur 30 (*)    Leukocytes, UA TRACE (*)    All other components within normal limits  URINE MICROSCOPIC-ADD ON - Abnormal; Notable for the following:    Casts HYALINE CASTS (*)    All other components within normal limits  LIPASE, BLOOD  POCT I-STAT TROPONIN I   Imaging Review Dg Chest 2 View  11/24/2013   CLINICAL DATA:  Nausea, vomiting and hypoxia.  EXAM: CHEST  2 VIEW  COMPARISON:  Chest x-ray 05/23/2013.  FINDINGS: Emphysematous changes (hyperexpansion and pruning of the pulmonary vasculature in the periphery of the lungs) are again noted throughout the lungs. There are bibasilar opacities favored to reflect presence of small bilateral pleural effusions with dependent areas of subsegmental atelectasis. No evidence of pulmonary edema. No pneumothorax. No definite suspicious appearing pulmonary nodules or masses are identified by plain film examination. Heart size is within normal limits. The patient is rotated to the left on today's exam, resulting in distortion of the mediastinal contours and reduced diagnostic sensitivity and specificity for mediastinal pathology. Atherosclerosis in the thoracic aorta.  IMPRESSION: 1. Small bilateral pleural effusions with probable areas of dependent subsegmental atelectasis in the lung bases bilaterally. 2. Emphysematous changes redemonstrated. 3. Atherosclerosis.   Electronically Signed   By: Trudie Reed M.D.   On: 11/24/2013 10:59    EKG Interpretation    Date/Time:  Sunday November 24 2013 10:05:34 EST Ventricular Rate:  91 PR Interval:  164 QRS Duration: 80 QT Interval:  358 QTC Calculation: 440 R Axis:   72 Text Interpretation:  Sinus rhythm Consider left atrial enlargement Borderline T abnormalities, inferior leads Confirmed by Rubin Payor  MD, Athel Merriweather ([id number]) on 11/24/2013 10:37:09 AM             MDM   Final diagnoses:  Nausea and vomiting  Leukocytosis       Harrold Donath R. Rubin Payor, MD 11/24/13 1512  Patient with nausea and vomiting. Has leukocytosis of 30. Urine does not show infection, nor does chest x-ray. Abdomen is overall benign. Will be admitted to internal medicine. Continues to have some vomiting  Juliet Rude. Rubin Payor, MD 11/24/13 1555

## 2013-11-24 NOTE — ED Notes (Signed)
Pt from home c/o nausea, vomiting and dizziness since Friday.

## 2013-11-24 NOTE — Progress Notes (Addendum)
ANTIBIOTIC CONSULT NOTE - INITIAL  Pharmacy Consult for Unasyn Indication: intra-abdominal infection  Allergies  Allergen Reactions  . Sulfa Antibiotics Other (See Comments)    unknown    Patient Measurements:    Vital Signs: Temp: 99 F (37.2 C) (02/15 1138) Temp src: Oral (02/15 1138) BP: 140/61 mmHg (02/15 1437) Pulse Rate: 79 (02/15 1437) Intake/Output from previous day:   Intake/Output from this shift:    Labs:  Recent Labs  11/24/13 0950  WBC 30.4*  HGB 13.9  PLT 210  CREATININE 1.50*   The CrCl is unknown because both a height and weight (above a minimum accepted value) are required for this calculation. No results found for this basename: VANCOTROUGH, VANCOPEAK, VANCORANDOM, GENTTROUGH, GENTPEAK, GENTRANDOM, TOBRATROUGH, TOBRAPEAK, TOBRARND, AMIKACINPEAK, AMIKACINTROU, AMIKACIN,  in the last 72 hours   Microbiology: No results found for this or any previous visit (from the past 720 hour(s)).  Medical History: Past Medical History  Diagnosis Date  . Stroke   . GERD (gastroesophageal reflux disease)   . Arthritis     hands     Assessment: [age] yoF presents with nausea and vomiting, unclear etiology, treating empirically with Unasyn for r/o intra-abdominal infection.    AKI.  SCr 1.5, CrCl~21 ml/min (CG)  WBC 30.4  Tmax 99  Goal of Therapy:  Eradication of infection  Plan:  1.  Unasyn 1.5g IV q12h. 2.  F/u SCr, clinical course.  Clance BollRunyon, Erandy Mceachern 11/24/2013,4:03 PM

## 2013-11-24 NOTE — H&P (Signed)
PCP:   Kari BaarsSHAW,W DOUGLAS, MD   Chief Complaint:  Anorexia with nausea vomiting x2-3 days  HPI: 80 year old female who lives at home with close supervision by son and daughter-in-law, with history of short-term memory loss consistent with dementia, last MMSE 25/30 last fall, chronic and intermittent dizziness with no evidence of orthostasis, osteoporosis with history of left hip fracture last year, chronic kidney disease stage III, mild anemia, hypertension, and history of right frontal stroke with left-sided hemiparesis resulting in gait instability dating back to 2012, she is also status post laparoscopic cholecystectomy in 2010. Again her son  looks in on her multiple times a day but she does live by herself, with significant short-term memory loss making review of systems somewhat questionable. However per son's report, the patient on February 13 as well as February 14 has had recurrent nausea with her breakfast, decreased oral intake, anorexic with dinner, not taking in very much at all, unclear she's had a bowel movement, denies fevers, chills, chest pain, shortness of breath, recent fall, visual changes, sore throat, swallowing difficulties, lower extremity edema, gross hematuria or blood in stool as far as we know. Daughter-in-law did notice cloudy foul-smelling urine this morning. Given refractory nausea vomiting and anorexia over 2-3 days making a viral gastritis less likely, family brought her into the emergency room. In emergency room the patient has remained hemodynamically stable however the patient has significant leukocytosis with a white blood cell count of 30,000 and has evidence of prerenal azotemia, but no clear evidence of infection. Some abdominal discomfort bowels called to admit for further evaluation and management.  Review of Systems:  Again questionable response given the need for short-term memory loss however per patient as well as family, no perceived fevers, chills, swallowing  difficulties, headaches, but some chronic intermittent dizziness, denies any new focal neurologic deficits, denies chest pain, shortness of breath, palpitations, recent falls but does have nausea and vomiting especially in the morning, with anorexia later in the day, decreased caloric as well as fluid intake, unclear when last bowel movement was, denies overt dysuria but family notes strong smelling urine. Denies blood in stool or urine.  Past Medical History: Past Medical History  Diagnosis Date  . Stroke   . GERD (gastroesophageal reflux disease)   . Arthritis     hands  Dementia with last MMSE being 25/30 in the fall 2014 History of hypertension Colon polyps Osteoporosis with history of hip fracture History of retinal hemorrhage Impaired fasting glucose History of microscopic hematuria  Past Surgical History  Procedure Laterality Date  . Cholecystectomy    . Ectopic pregnancy surgery    . Hip arthroplasty Left 05/24/2013    Procedure: ARTHROPLASTY UNIPOLAR HIP;  Surgeon: Eulas PostJoshua P Landau, MD;  [facility];  Service: Orthopedics;  Laterality: Left;    Medications: Prior to Admission medications   Medication Sig Start Date End Date Taking? Authorizing Provider  aspirin 325 MG tablet Take 1 tablet (325 mg total) by mouth daily. 05/29/13  Yes Ripudeep Jenna LuoK Rai, MD  calcium carbonate (TUMS - DOSED IN MG ELEMENTAL CALCIUM) 500 MG chewable tablet Chew 1 tablet by mouth daily as needed for indigestion or heartburn.   Yes Historical Provider, MD  Multiple Vitamin (MULTIVITAMIN WITH MINERALS) TABS tablet Take 1 tablet by mouth daily.   Yes Historical Provider, MD  Polyethyl Glycol-Propyl Glycol (SYSTANE) 0.4-0.3 % SOLN Place 2 drops into both eyes daily as needed (dry eyes).   Yes Historical Provider, MD  sennosides-docusate sodium (SENOKOT-S)  8.6-50 MG tablet Take 2 tablets by mouth daily. 05/24/13  Yes Eulas Post, MD  vitamin C (ASCORBIC ACID) 500 MG tablet Take 500 mg by mouth daily.    Yes Historical Provider, MD    Allergies:   Allergies  Allergen Reactions  . Sulfa Antibiotics Other (See Comments)    unknown    Social History:  reports that she has quit smoking. She has never used smokeless tobacco. She reports that she drinks alcohol. She reports that she does not use illicit drugs.  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, quit smoking in 2009 after 20 years  Family History: Family history of heart disease ALS gout hypertension   Physical Exam: Filed Vitals:   11/24/13 0924 11/24/13 1138  BP: 117/57 140/65  Pulse: 93   Temp:  99 F (37.2 C)  TempSrc:  Oral  Resp: 18 16  SpO2: 92% 97%   Physical exam Appearance no apparent distress, verbal, answering questions appropriately with short-term memory deficits Head exam normocephalic atraumatic, sclera anicteric extraconal movements are intact his oropharyngeal lesions, mucosa moist, tympanic membranes clear bilaterally with no erythema or significant cerumen Neck supple, no cervical lymphadenopathy, no masses Unclear to auscultation bilaterally with no use of accessory muscles or respiratory distress, no Rales rhonchi or wheezes Cardiovascular reveals regular rate and rhythm without murmurs rubs or gallops appreciated Abdomen soft, bowel sounds present, subjectively tender towards the right side, but not necessarily under the rib cage, with no guarding or rebound No suprapubic tenderness, no CVA tenderness No edema pedal pulses are intact and symmetric, without cyanosis, edema or clubbing No axillary lymphadenopathy Cranial nerves II through XII grossly intact, no resting tremors, strength intact in all 4 extremities against gravity, range of motion intact however not assessed with respect to bilateral hips Sensory exam to light touch intact Alert to person place situation.     Labs on Admission:    Recent Labs  11/24/13 0950  NA 140  K 4.1  CL 94*  CO2 30  GLUCOSE 165*  BUN 32*  CREATININE 1.50*  CALCIUM 10.3  Basic metabolic panel from 8/14 reveals BUN 15, creatinine 1.05   Recent Labs  11/24/13 0950  AST 21  ALT 8  ALKPHOS 57  BILITOT 1.4*  PROT 7.9  ALBUMIN 3.8    Recent Labs  11/24/13 0950  LIPASE 12    Recent Labs  11/24/13 0950  WBC 30.4*  NEUTROABS 23.1*  HGB 13.9  HCT 42.6  MCV 92.4  PLT 210   CBC from 8/14 reveals white blood cell count 11.5, hemoglobin 9.6, normal platelet count  No results found for this basename: CKTOTAL, CKMB, CKMBINDEX, TROPONINI,  in the last 72 hours No results found for this basename: TSH, T4TOTAL, FREET3, T3FREE, THYROIDAB,  in the last 72 hours No results found for this basename: VITAMINB12, FOLATE, FERRITIN, TIBC, IRON, RETICCTPCT,  in the last 72 hours  Radiological Exams on Admission: Dg Chest 2 View  11/24/2013   CLINICAL DATA:  Nausea, vomiting and hypoxia.  EXAM: CHEST  2 VIEW  COMPARISON:  Chest x-ray 05/23/2013.  FINDINGS: Emphysematous changes (hyperexpansion and pruning of the pulmonary vasculature in the periphery of the lungs) are again noted throughout the lungs. There are bibasilar opacities favored to reflect presence of small bilateral pleural effusions with dependent areas of subsegmental atelectasis. No evidence of pulmonary edema. No pneumothorax. No definite suspicious appearing  pulmonary nodules or masses are identified by plain film examination. Heart size is within normal limits. The patient is rotated to the left on today's exam, resulting in distortion of the mediastinal contours and reduced diagnostic sensitivity and specificity for mediastinal pathology. Atherosclerosis in the thoracic aorta.  IMPRESSION: 1. Small bilateral pleural effusions with probable areas of dependent subsegmental atelectasis in the lung bases bilaterally. 2. Emphysematous changes redemonstrated. 3. Atherosclerosis.    Electronically Signed   By: Trudie Reed M.D.   On: 11/24/2013 10:59   Orders placed during the hospital encounter of 11/24/13  . ED EKG  . ED EKG  . EKG 12-LEAD  . EKG 12-LEAD    Assessment/Plan Nausea and vomiting with anorexia-unclear etiology, with significant leukocytosis complicated by prerenal azotemia, we'll give IV fluids, will pursue CT scan of the abdomen and pelvis given age, significant comorbidities and inability to obtain an accurate well-defined history and abdominal discomfort on the right side. Patient does not have a toxic appearance at this point however given age may decompensate quickly. We'll treat empirically with Unasyn however would have low threshold to discontinue Leukocytosis-unclear etiology, chest x-ray with some findings as noted below but not significant with no shortness of breath, pulmonary findings on exam, question abdominal discomfort, urinalysis not clearly abnormal and consistent with infection. Not sure what we will be treating with antibiotics however No evidence of anemia but presumably secondary to prerenal azotemia and dehydrated state-last hemoglobin 9.6 question whether her blood counts will drop with IV fluids Small Bilateral pleural effusions with questionable atelectasis-no respiratory distress, no evidence of heart failure based on review of systems, physical exam we'll monitor Abdominal pain see above-unclear etiology, will followup on CT scan, doubt UTI Dementia-stable but does limit history taking  DO NOT RESUSCITATE confirmed with son who is power of attorney  History of elevated TSH, will recheck Jalani Cullifer R 11/24/2013, 2:19 PM

## 2013-11-25 ENCOUNTER — Observation Stay (HOSPITAL_COMMUNITY): Payer: Medicare HMO

## 2013-11-25 DIAGNOSIS — F05 Delirium due to known physiological condition: Secondary | ICD-10-CM

## 2013-11-25 DIAGNOSIS — N189 Chronic kidney disease, unspecified: Secondary | ICD-10-CM

## 2013-11-25 DIAGNOSIS — G309 Alzheimer's disease, unspecified: Secondary | ICD-10-CM

## 2013-11-25 DIAGNOSIS — K56609 Unspecified intestinal obstruction, unspecified as to partial versus complete obstruction: Secondary | ICD-10-CM | POA: Diagnosis present

## 2013-11-25 DIAGNOSIS — I69354 Hemiplegia and hemiparesis following cerebral infarction affecting left non-dominant side: Secondary | ICD-10-CM

## 2013-11-25 DIAGNOSIS — F0282 Dementia in other diseases classified elsewhere, unspecified severity, with psychotic disturbance: Secondary | ICD-10-CM | POA: Diagnosis present

## 2013-11-25 DIAGNOSIS — F0280 Dementia in other diseases classified elsewhere without behavioral disturbance: Secondary | ICD-10-CM | POA: Diagnosis present

## 2013-11-25 DIAGNOSIS — N179 Acute kidney failure, unspecified: Secondary | ICD-10-CM | POA: Diagnosis present

## 2013-11-25 LAB — COMPREHENSIVE METABOLIC PANEL
ALT: 12 U/L (ref 0–35)
AST: 24 U/L (ref 0–37)
Albumin: 3.3 g/dL — ABNORMAL LOW (ref 3.5–5.2)
Alkaline Phosphatase: 60 U/L (ref 39–117)
BUN: 44 mg/dL — ABNORMAL HIGH (ref 6–23)
CO2: 27 mEq/L (ref 19–32)
CREATININE: 1.43 mg/dL — AB (ref 0.50–1.10)
Calcium: 8.9 mg/dL (ref 8.4–10.5)
Chloride: 97 mEq/L (ref 96–112)
GFR calc Af Amer: 36 mL/min — ABNORMAL LOW (ref >90)
GFR, EST NON AFRICAN AMERICAN: 31 mL/min — AB (ref >90)
Glucose, Bld: 128 mg/dL — ABNORMAL HIGH (ref 70–99)
Potassium: 3.8 mEq/L (ref 3.7–5.3)
SODIUM: 140 meq/L (ref 137–147)
TOTAL PROTEIN: 6.7 g/dL (ref 6.0–8.3)
Total Bilirubin: 0.8 mg/dL (ref 0.3–1.2)

## 2013-11-25 LAB — CBC
HEMATOCRIT: 35.6 % — AB (ref 36.0–46.0)
Hemoglobin: 11.7 g/dL — ABNORMAL LOW (ref 12.0–15.0)
MCH: 30.2 pg (ref 26.0–34.0)
MCHC: 32.9 g/dL (ref 30.0–36.0)
MCV: 92 fL (ref 78.0–100.0)
Platelets: 190 10*3/uL (ref 150–400)
RBC: 3.87 MIL/uL (ref 3.87–5.11)
RDW: 14.5 % (ref 11.5–15.5)
WBC: 27.1 10*3/uL — ABNORMAL HIGH (ref 4.0–10.5)

## 2013-11-25 LAB — MRSA PCR SCREENING: MRSA by PCR: NEGATIVE

## 2013-11-25 LAB — TSH: TSH: 1.801 u[IU]/mL (ref 0.350–4.500)

## 2013-11-25 MED ORDER — LIP MEDEX EX OINT
TOPICAL_OINTMENT | CUTANEOUS | Status: AC
  Administered 2013-11-25: 1
  Filled 2013-11-25: qty 7

## 2013-11-25 MED ORDER — DEXTROSE 5 % IV SOLN
1.0000 g | INTRAVENOUS | Status: DC
  Administered 2013-11-26 (×2): 1 g via INTRAVENOUS
  Filled 2013-11-25 (×3): qty 1

## 2013-11-25 MED ORDER — ONDANSETRON HCL 4 MG/2ML IJ SOLN
4.0000 mg | Freq: Four times a day (QID) | INTRAMUSCULAR | Status: DC | PRN
  Administered 2013-11-25 – 2013-12-14 (×14): 4 mg via INTRAVENOUS
  Filled 2013-11-25 (×14): qty 2

## 2013-11-25 NOTE — Progress Notes (Signed)
Pt pulled out NG tube, she has become confused thinking she is at home and she wants to get up and straighten things up. Bed alarm on, NG drained 350 ml brown liquid. Reported to the evening nurse. Pt remains NPO.

## 2013-11-25 NOTE — Progress Notes (Signed)
Ms Haywood LassoCaudle is alert to name only and confuse, pull NG tube out earlier during the day, per day shift Nurse Cyndi. Notified  Dr Link SnufferHolwerda,  New order given to leave NG tube out, maintain NPO diet,  and continue to re orientate pt. Order implemented. Will continue to monitor.

## 2013-11-25 NOTE — Progress Notes (Signed)
UR completed. Patient changed to inpatient- requiring IVF @ 75cc/hr-NPO 

## 2013-11-25 NOTE — Evaluation (Signed)
Physical Therapy Evaluation Patient Details Name: Lindsey CarnesRuth S Hensley MRN: [id number] DOB: 22-Jun-1922 Today's Date: 11/25/2013 Time: [id number] PT Time Calculation (min): 12 min  PT Assessment / Plan / Recommendation History of Present Illness  80 y.o. female with PMH of CVA with L HP, L hip fx s/p hemiarthroplasy 6 months ago, admitted from home where she walked independently with a RW with nausea and vomiting.   Clinical Impression  *Pt is independent with RW at home, now limited tolerance to activity due to nausea and vomiting. She would benefit from acute PT to maximize safety and independence with mobility. **    PT Assessment  Patient needs continued PT services    Follow Up Recommendations  Home health PT    Does the patient have the potential to tolerate intense rehabilitation      Barriers to Discharge        Equipment Recommendations  None recommended by PT    Recommendations for Other Services     Frequency Min 3X/week    Precautions / Restrictions Precautions Precautions: None Restrictions Weight Bearing Restrictions: No   Pertinent Vitals/Pain *pt reports nausea RN notified**      Mobility  Bed Mobility Overal bed mobility: Modified Independent General bed mobility comments: with rail, for supine to sit and sit to supine Transfers General transfer comment: NT-pt became nauseous in sitting and began to vomit, returned to supine    Exercises     PT Diagnosis: Generalized weakness  PT Problem List: Decreased activity tolerance;Decreased mobility PT Treatment Interventions: Gait training;Functional mobility training;Therapeutic exercise;Therapeutic activities;Patient/family education     PT Goals(Current goals can be found in the care plan section) Acute Rehab PT Goals Patient Stated Goal: return to independence with RW PT Goal Formulation: With patient/family Time For Goal Achievement: 12/09/13 Potential to Achieve Goals: Good  Visit Information  Last  PT Received On: 11/25/13 History of Present Illness: 80 y.o. female with PMH of CVA with L HP, L hip fx s/p hemiarthroplasy 6 months ago, admitted from home where she walked independently with a RW with nausea and vomiting.        Prior Functioning  Home Living Family/patient expects to be discharged to:: Private residence Living Arrangements: Children Available Help at Discharge: Family;Available PRN/intermittently Type of Home: House Home Access: Stairs to enter Entergy CorporationEntrance Stairs-Number of Steps: 3 Entrance Stairs-Rails: Right Home Layout: One level Home Equipment: Walker - 2 wheels;Hand held shower head;Grab bars - tub/shower;Grab bars - toilet;Shower seat Prior Function Level of Independence: Independent with assistive device(s) Communication Communication: No difficulties    Cognition  Cognition Arousal/Alertness: Awake/alert Behavior During Therapy: WFL for tasks assessed/performed Overall Cognitive Status: History of cognitive impairments - at baseline (dementia, but able to follow commands and answer most questions about home situation) Memory: Decreased short-term memory    Extremity/Trunk Assessment Upper Extremity Assessment Upper Extremity Assessment: Overall WFL for tasks assessed Lower Extremity Assessment Lower Extremity Assessment: Overall WFL for tasks assessed Cervical / Trunk Assessment Cervical / Trunk Assessment: Normal   Balance    End of Session PT - End of Session Activity Tolerance: Treatment limited secondary to medical complications (Comment) (nausea and vomiting) Patient left: in bed;with call bell/phone within reach;with family/visitor present Nurse Communication: Mobility status;Other (comment) (notified RN of pt's vomiting during PT session)  GP Functional Assessment Tool Used: clinical judgement Functional Limitation: Mobility: Walking and moving around Mobility: Walking and Moving Around Current Status ([id number]): At least 40 percent but less than  60 percent impaired, limited  or restricted Mobility: Walking and Moving Around Goal Status [phone]): At least 1 percent but less than 20 percent impaired, limited or restricted   Tamala Ser 11/25/2013, 2:19 PM [phone]

## 2013-11-25 NOTE — Progress Notes (Signed)
Clinical Social Work  CSW received inappropriate referral for SNF placement. PT recommending HH at this time. CSW is signing off but available if needed.  BrandonHolly Armella Stogner, [place] [phone]

## 2013-11-25 NOTE — Progress Notes (Signed)
Subjective: Patient is disoriented- no complaints- denies abdominal pain.  No N/V since yesterday.  No BM.  Son reports increased confusion over the past 6 months.  Considering additional support at home.  Objective: Vital signs in last 24 hours: Temp:  [97.8 F (36.6 C)-99 F (37.2 C)] 97.8 F (36.6 C) (02/16 0517) Pulse Rate:  [79-88] 85 (02/16 0517) Resp:  [16-18] 18 (02/16 0517) BP: (134-145)/(61-73) 145/73 mmHg (02/16 0517) SpO2:  [90 %-97 %] 93 % (02/16 0517) Weight:  [57.561 kg (126 lb 14.4 oz)] 57.561 kg (126 lb 14.4 oz) (02/16 0751) Weight change:     CBG (last 3)  No results found for this basename: GLUCAP,  in the last 72 hours  Intake/Output from previous day:   Intake/Output this shift:    General appearance: alert and disoriented, in no acute distress Eyes: no scleral icterus Throat: oropharynx moist without erythema Resp: clear to auscultation bilaterally Cardio: regular rate and rhythm GI: mildly distended with minimal bowel sounds; no tenderness Extremities: no clubbing, cyanosis or edema   Lab Results:  Recent Labs  11/24/13 0950 11/25/13 0455  NA 140 140  K 4.1 3.8  CL 94* 97  CO2 30 27  GLUCOSE 165* 128*  BUN 32* 44*  CREATININE 1.50* 1.43*  CALCIUM 10.3 8.9    Recent Labs  11/24/13 0950 11/25/13 0455  AST 21 24  ALT 8 12  ALKPHOS 57 60  BILITOT 1.4* 0.8  PROT 7.9 6.7  ALBUMIN 3.8 3.3*    Recent Labs  11/24/13 0950 11/25/13 0455  WBC 30.4* 27.1*  NEUTROABS 23.1*  --   HGB 13.9 11.7*  HCT 42.6 35.6*  MCV 92.4 92.0  PLT 210 190   Lab Results  Component Value Date   INR 1.10 02/25/2011   No results found for this basename: CKTOTAL, CKMB, CKMBINDEX, TROPONINI,  in the last 72 hours No results found for this basename: TSH, T4TOTAL, FREET3, T3FREE, THYROIDAB,  in the last 72 hours No results found for this basename: VITAMINB12, FOLATE, FERRITIN, TIBC, IRON, RETICCTPCT,  in the last 72 hours  Studies/Results: Ct Abdomen  Pelvis Wo Contrast  11/24/2013   CLINICAL DATA:  Elevated white blood cell count, nausea and vomiting. Renal insufficiency.  EXAM: CT ABDOMEN AND PELVIS WITHOUT CONTRAST  TECHNIQUE: Multidetector CT imaging of the abdomen and pelvis was performed following the standard protocol without intravenous contrast.  COMPARISON:  DG PELVIS PORTABLE dated 05/24/2013; CT ABD-PELV WO/W CM dated 07/27/2004  FINDINGS: There is a new moderate right pleural effusion and mild right basilar atelectasis. No clear evidence of basilar pneumonia.  Non IV contrast images demonstrate no focal hepatic lesion. Post cholecystectomy. The pancreas, spleen, adrenal glands, kidneys grossly normal.  Stomach is normal. The small bowel is diffusely dilated with loops of bowel measuring up to 3.6 cm. No transition point identified clearly. Note pneumatosis or portal venous gas. No intraperitoneal free air. The most distal ileum (image 55, series 2) is a decompressed. The oral contrast does not progresses into the small bowel. Colon rectosigmoid colon are relatively normal. Small amount of stool throughout the colon.  Abdominal aorta is normal caliber with intimal calcification. No retroperitoneal periportal lymphadenopathy.  There is no free fluid the pelvis. The uterus and bladder grossly normal. The adnexa appear normal.  There is a left hip total arthroplasty. No aggressive osseous lesion. There is rotatory scoliosis of the spine with severe endplate osteophytosis.  IMPRESSION: 1. Moderate right pleural effusion with basilar atelectasis. No clear  evidence of right lower lobe pneumonia. 2. Dilated small bowel with decompressed distal ileum. Findings suggestive of ileus versus partial obstruction. No evidence of pneumatosis or portal venous gas. 3. No free fluid or free air in the abdomen or pelvis.   Electronically Signed   By: Genevive BiStewart  Edmunds M.D.   On: 11/24/2013 18:55   Dg Chest 2 View  11/24/2013   CLINICAL DATA:  Nausea, vomiting and  hypoxia.  EXAM: CHEST  2 VIEW  COMPARISON:  Chest x-ray 05/23/2013.  FINDINGS: Emphysematous changes (hyperexpansion and pruning of the pulmonary vasculature in the periphery of the lungs) are again noted throughout the lungs. There are bibasilar opacities favored to reflect presence of small bilateral pleural effusions with dependent areas of subsegmental atelectasis. No evidence of pulmonary edema. No pneumothorax. No definite suspicious appearing pulmonary nodules or masses are identified by plain film examination. Heart size is within normal limits. The patient is rotated to the left on today's exam, resulting in distortion of the mediastinal contours and reduced diagnostic sensitivity and specificity for mediastinal pathology. Atherosclerosis in the thoracic aorta.  IMPRESSION: 1. Small bilateral pleural effusions with probable areas of dependent subsegmental atelectasis in the lung bases bilaterally. 2. Emphysematous changes redemonstrated. 3. Atherosclerosis.   Electronically Signed   By: Trudie Reedaniel  Entrikin M.D.   On: 11/24/2013 10:59     Medications: Scheduled: . ampicillin-sulbactam (UNASYN) IV  1.5 g Intravenous Q12H  . aspirin  325 mg Oral Daily  . enoxaparin (LOVENOX) injection  30 mg Subcutaneous Q24H  . multivitamin with minerals  1 tablet Oral Daily  . pantoprazole (PROTONIX) IV  40 mg Intravenous Q24H  . senna-docusate  2 tablet Oral Daily   Continuous: . sodium chloride 75 mL/hr at 11/24/13 1706    Assessment/Plan: Principal Problem: 1. Nausea and vomiting secondary to Small bowel obstruction, partial- CT shows probable partial SBO.  Likely adhesions due to prior abdominal surgery (cholecystectomy).  Continue supportive care with IVF hydration, clear liquid diet. Zofran prn. If recurrent N/V will consider NG tube and NPO status.  Abdominal X-ray today. Active Problems: 2. Leukocytosis- likely leukemoid reaction secondary to SBO.  No evidence of infection- will discontinue Unasyn.    3. Acute on Chronic Renal Failure- secondary to prerenal azotemia.  Will monitor with IVF hydration 4. 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  May need additional services at home.  Case Management consult to assist son with options. 5. History of CVA (cerebrovascular accident) with left hemiparesis-continue ASA.   6. Disposition- anticipate discharge later this week if SBO resolves and tolerating diet.  PT/OT, CM consults.  OOB to chair.   LOS: 1 day   Malahki Gasaway,W DOUGLAS 11/25/2013, 9:48 AM

## 2013-11-25 NOTE — Progress Notes (Signed)
Pt vomited everytime she took a drink and right after she swallowed her medications. Emesis is brown liquid. NG tube inserted in Lt nare, Pt. Tolerated procedure well. Drainage is brown. She is now NPO. Nasal swab done for  [place] PCR. Son at bedside. Mouth care done prior to NG being inserted.

## 2013-11-26 ENCOUNTER — Inpatient Hospital Stay (HOSPITAL_COMMUNITY): Payer: Medicare HMO

## 2013-11-26 DIAGNOSIS — K56609 Unspecified intestinal obstruction, unspecified as to partial versus complete obstruction: Secondary | ICD-10-CM

## 2013-11-26 LAB — URINE MICROSCOPIC-ADD ON

## 2013-11-26 LAB — BASIC METABOLIC PANEL
BUN: 41 mg/dL — ABNORMAL HIGH (ref 6–23)
CHLORIDE: 101 meq/L (ref 96–112)
CO2: 28 mEq/L (ref 19–32)
Calcium: 8.3 mg/dL — ABNORMAL LOW (ref 8.4–10.5)
Creatinine, Ser: 1.3 mg/dL — ABNORMAL HIGH (ref 0.50–1.10)
GFR calc Af Amer: 41 mL/min — ABNORMAL LOW (ref >90)
GFR calc non Af Amer: 35 mL/min — ABNORMAL LOW (ref >90)
GLUCOSE: 129 mg/dL — AB (ref 70–99)
Potassium: 3.5 mEq/L — ABNORMAL LOW (ref 3.7–5.3)
Sodium: 141 mEq/L (ref 137–147)

## 2013-11-26 LAB — URINALYSIS, ROUTINE W REFLEX MICROSCOPIC
Glucose, UA: NEGATIVE mg/dL
Ketones, ur: NEGATIVE mg/dL
Leukocytes, UA: NEGATIVE
Nitrite: NEGATIVE
PH: 5.5 (ref 5.0–8.0)
Protein, ur: 30 mg/dL — AB
Specific Gravity, Urine: 1.026 (ref 1.005–1.030)
Urobilinogen, UA: 0.2 mg/dL (ref 0.0–1.0)

## 2013-11-26 LAB — CBC
HCT: 33.1 % — ABNORMAL LOW (ref 36.0–46.0)
HEMOGLOBIN: 10.9 g/dL — AB (ref 12.0–15.0)
MCH: 30.4 pg (ref 26.0–34.0)
MCHC: 32.9 g/dL (ref 30.0–36.0)
MCV: 92.5 fL (ref 78.0–100.0)
Platelets: 181 10*3/uL (ref 150–400)
RBC: 3.58 MIL/uL — AB (ref 3.87–5.11)
RDW: 14.4 % (ref 11.5–15.5)
WBC: 27.2 10*3/uL — AB (ref 4.0–10.5)

## 2013-11-26 NOTE — Care Management Note (Unsigned)
    Page 1 of 1   11/26/2013     12:32:33 PM   CARE MANAGEMENT NOTE 11/26/2013  Patient:  Lindsey Hensley, Lindsey Hensley   Account Number:  [id number]  Date Initiated:  11/26/2013  Documentation initiated by:  [facility]  Subjective/Objective Assessment:   80 year old female admitted with vomiting, SBO and dizziness.     Action/Plan:   From home, needs HH services at d/c.   Anticipated DC Date:  11/29/2013   Anticipated DC Plan:  [place]  CM consult      Choice offered to / List presented to:             [place].   Status of service:  In process, will continue to follow Medicare Important Message given?  NA - LOS <3 / Initial given by admissions (If response is "NO", the following Medicare IM given date fields will be blank) Date Medicare IM given:   Date Additional Medicare IM given:    Discharge Disposition:    Per UR Regulation:  Reviewed for med. necessity/level of care/duration of stay  If discussed at [place] of Stay Meetings, dates discussed:    Comments:  11/26/13 Allene Dillon RN BSN Met with pt and son at bedside to discuss [facility] services. PT has recommended HHPT services and son has chosen [facility] to provide the services. I also provided him with the list of Private duty providers and let him know he would be responsible of setting up those servcies since they are not covered by her insurance company. He voiced understanding. [facility] has been made aware of choice.

## 2013-11-26 NOTE — Progress Notes (Signed)
ANTIBIOTIC CONSULT NOTE - INITIAL  Pharmacy Consult for Cefepime Indication: intra-abdominal infection/fever  Allergies  Allergen Reactions  . Sulfa Antibiotics Other (See Comments)    unknown    Patient Measurements: Height: 5\' 4"  (162.6 cm) Weight: 126 lb 14.4 oz (57.561 kg) IBW/kg (Calculated) : 54.7  Vital Signs: Temp: 101.5 F (38.6 C) (02/16 2220) Temp src: Oral (02/16 2220) BP: 120/66 mmHg (02/16 2220) Pulse Rate: 78 (02/16 2220) Intake/Output from previous day: 02/16 0701 - 02/17 0700 In: 1842.5 [P.O.:200; I.V.:1642.5] Out: 1060 [Urine:260; Emesis/NG output:800] Intake/Output from this shift:    Labs:  Recent Labs  11/24/13 0950 11/25/13 0455  WBC 30.4* 27.1*  HGB 13.9 11.7*  PLT 210 190  CREATININE 1.50* 1.43*   Estimated Creatinine Clearance: 23 ml/min (by C-G formula based on Cr of 1.43). No results found for this basename: VANCOTROUGH, Leodis BinetVANCOPEAK, VANCORANDOM, GENTTROUGH, GENTPEAK, GENTRANDOM, TOBRATROUGH, TOBRAPEAK, TOBRARND, AMIKACINPEAK, AMIKACINTROU, AMIKACIN,  in the last 72 hours   Microbiology: Recent Results (from the past 720 hour(s))  MRSA PCR SCREENING     Status: None   Collection Time    11/25/13  2:32 PM      Result Value Ref Range Status   MRSA by PCR NEGATIVE  NEGATIVE Final   Comment:            The GeneXpert MRSA Assay (FDA     approved for NASAL specimens     only), is one component of a     comprehensive MRSA colonization     surveillance program. It is not     intended to diagnose MRSA     infection nor to guide or     monitor treatment for     MRSA infections.    Medical History: Past Medical History  Diagnosis Date  . Stroke   . GERD (gastroesophageal reflux disease)   . Arthritis     hands     Assessment: [age] yoF presents with nausea and vomiting, unclear etiology, treated empirically with Unasyn 2/15 for r/o intra-abdominal infection.  Pt still with fever, Unasyn changed to Cefepime 2/16  AKI.  SCr 1.4,  CrCl~31 ml/min (CG)  WBC 27.1  Tmax 101.5  Goal of Therapy:  Eradication of infection  Plan:  1.  Cefepime 1Gm IV q24h 2.  F/u SCr, clinical course.  Susanne GreenhouseGreen, Nikita Humble R 11/26/2013,12:00 AM

## 2013-11-26 NOTE — Progress Notes (Signed)
Subjective: NG placed yesterday.  Patient pulled out.  No nausea/vomitting since that time.  Fever to 101.5 overnight.  No complaints this am other than mouth is dry.  Objective: Vital signs in last 24 hours: Temp:  [98.5 F (36.9 C)-101.5 F (38.6 C)] 99.5 F (37.5 C) (02/17 0457) Pulse Rate:  [78-86] 86 (02/17 0457) Resp:  [18] 18 (02/16 2220) BP: (120-136)/(50-69) 121/50 mmHg (02/17 0457) SpO2:  [92 %-98 %] 92 % (02/17 0457) Weight:  [57.289 kg (126 lb 4.8 oz)] 57.289 kg (126 lb 4.8 oz) (02/17 0410) Weight change:     CBG (last 3)  No results found for this basename: GLUCAP,  in the last 72 hours  Intake/Output from previous day: 02/16 0701 - 02/17 0700 In: 1842.5 [P.O.:200; I.V.:1642.5] Out: 1060 [Urine:260; Emesis/NG output:800] Intake/Output this shift:    General appearance: alert and less disorientation; no acute distress Eyes: no scleral icterus Throat: oropharynx moist without erythema Resp: clear to auscultation bilaterally Cardio: regular rate and rhythm ZO:[id number] distended with mild diffuse tenderness; no rebound or guarding; no bowel sounds heard Extremities: no clubbing, cyanosis or edema   Lab Results:  Recent Labs  11/25/13 0455 11/26/13 0535  NA 140 141  K 3.8 3.5*  CL 97 101  CO2 27 28  GLUCOSE 128* 129*  BUN 44* 41*  CREATININE 1.43* 1.30*  CALCIUM 8.9 8.3*    Recent Labs  11/24/13 0950 11/25/13 0455  AST 21 24  ALT 8 12  ALKPHOS 57 60  BILITOT 1.4* 0.8  PROT 7.9 6.7  ALBUMIN 3.8 3.3*    Recent Labs  11/24/13 0950 11/25/13 0455 11/26/13 0535  WBC 30.4* 27.1* 27.2*  NEUTROABS 23.1*  --   --   HGB 13.9 11.7* 10.9*  HCT 42.6 35.6* 33.1*  MCV 92.4 92.0 92.5  PLT 210 190 181   Lab Results  Component Value Date   INR 1.10 02/25/2011   No results found for this basename: CKTOTAL, CKMB, CKMBINDEX, TROPONINI,  in the last 72 hours  Recent Labs  11/25/13 0455  TSH 1.801   No results found for this basename: VITAMINB12,  FOLATE, FERRITIN, TIBC, IRON, RETICCTPCT,  in the last 72 hours  Studies/Results: Ct Abdomen Pelvis Wo Contrast  11/24/2013   CLINICAL DATA:  Elevated white blood cell count, nausea and vomiting. Renal insufficiency.  EXAM: CT ABDOMEN AND PELVIS WITHOUT CONTRAST  TECHNIQUE: Multidetector CT imaging of the abdomen and pelvis was performed following the standard protocol without intravenous contrast.  COMPARISON:  DG PELVIS PORTABLE dated 05/24/2013; CT ABD-PELV WO/W CM dated 07/27/2004  FINDINGS: There is a new moderate right pleural effusion and mild right basilar atelectasis. No clear evidence of basilar pneumonia.  Non IV contrast images demonstrate no focal hepatic lesion. Post cholecystectomy. The pancreas, spleen, adrenal glands, kidneys grossly normal.  Stomach is normal. The small bowel is diffusely dilated with loops of bowel measuring up to 3.6 cm. No transition point identified clearly. Note pneumatosis or portal venous gas. No intraperitoneal free air. The most distal ileum (image 55, series 2) is a decompressed. The oral contrast does not progresses into the small bowel. Colon rectosigmoid colon are relatively normal. Small amount of stool throughout the colon.  Abdominal aorta is normal caliber with intimal calcification. No retroperitoneal periportal lymphadenopathy.  There is no free fluid the pelvis. The uterus and bladder grossly normal. The adnexa appear normal.  There is a left hip total arthroplasty. No aggressive osseous lesion. There is rotatory scoliosis of the  spine with severe endplate osteophytosis.  IMPRESSION: 1. Moderate right pleural effusion with basilar atelectasis. No clear evidence of right lower lobe pneumonia. 2. Dilated small bowel with decompressed distal ileum. Findings suggestive of ileus versus partial obstruction. No evidence of pneumatosis or portal venous gas. 3. No free fluid or free air in the abdomen or pelvis.   Electronically Signed   By: Genevive BiStewart  Edmunds M.D.    On: 11/24/2013 18:55   Dg Chest 2 View  11/24/2013   CLINICAL DATA:  Nausea, vomiting and hypoxia.  EXAM: CHEST  2 VIEW  COMPARISON:  Chest x-ray 05/23/2013.  FINDINGS: Emphysematous changes (hyperexpansion and pruning of the pulmonary vasculature in the periphery of the lungs) are again noted throughout the lungs. There are bibasilar opacities favored to reflect presence of small bilateral pleural effusions with dependent areas of subsegmental atelectasis. No evidence of pulmonary edema. No pneumothorax. No definite suspicious appearing pulmonary nodules or masses are identified by plain film examination. Heart size is within normal limits. The patient is rotated to the left on today's exam, resulting in distortion of the mediastinal contours and reduced diagnostic sensitivity and specificity for mediastinal pathology. Atherosclerosis in the thoracic aorta.  IMPRESSION: 1. Small bilateral pleural effusions with probable areas of dependent subsegmental atelectasis in the lung bases bilaterally. 2. Emphysematous changes redemonstrated. 3. Atherosclerosis.   Electronically Signed   By: Trudie Reedaniel  Entrikin M.D.   On: 11/24/2013 10:59   Dg Abd 2 Views  11/25/2013   CLINICAL DATA:  History of partial small bowel obstruction  EXAM: ABDOMEN - 2 VIEW  COMPARISON:  CT abdomen pelvis of 11/24/2013  FINDINGS: There are persistent air-fluid levels with gaseous distention of small bowel consistent with partial small bowel obstruction. No free air is seen on the left lateral decubitus of the abdomen. A thoracolumbar scoliosis is noted with diffuse degenerative change.  IMPRESSION: Persistent small bowel obstruction.  No free air.   Electronically Signed   By: Dwyane DeePaul  Barry M.D.   On: 11/25/2013 15:06     Medications: Scheduled: . aspirin  325 mg Oral Daily  . ceFEPime (MAXIPIME) IV  1 g Intravenous Q24H  . enoxaparin (LOVENOX) injection  30 mg Subcutaneous Q24H  . multivitamin with minerals  1 tablet Oral Daily  .  pantoprazole (PROTONIX) IV  40 mg Intravenous Q24H  . senna-docusate  2 tablet Oral Daily   Continuous: . sodium chloride 75 mL/hr at 11/26/13 0005    Assessment/Plan: Principal Problem:  1. Nausea and vomiting secondary to Small bowel obstruction, partial- CT shows probable partial SBO. Likely adhesions due to prior abdominal surgery (cholecystectomy). Continue supportive care with IVF hydration, NPO. Zofran prn. Repeat Abdominal Xray today.  General surgery consult given lack of improvement. Active Problems:  2. Leukocytosis- in setting of fever broad spectrum antibiotics restarted to cover GI sources.  Obtain CXR.  Blood cultures pending.  3. Acute on Chronic Renal Failure- secondary to prerenal azotemia. Stable with IVF hydration  4. 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 May need additional services at home. Case Management consult to assist son with options.  5. History of CVA (cerebrovascular accident) with left hemiparesis-continue ASA.  6. Disposition- anticipate discharge later this week if SBO resolves and tolerating diet. PT/OT, CM consults. OOB to chair.    LOS: 2 days   SHAW,W DOUGLAS 11/26/2013, 8:09 AM

## 2013-11-26 NOTE — Consult Note (Signed)
Re:   Lindsey Hensley DOB:   [birth year] MRN:   [id number]  [facility] Consultation  ASSESSMENT AND PLAN: 1.  SBO  On Cefepime  Appears to be making no progress since admission.  Will repeat KUB tomorrow.  If no better, will plan exploration.  I explained this to the patient's son, Lindsey Hensley.  2.  Dementia  Short term memory loss  The patient cannot tell me much of her problems. 3.  History of stroke  History of right frontal stroke with left-sided hemiparesis resulting in gait instability dating back to 2012 4.  History of left hip fx - 05/23/2013  J. Landau 5.  HTN 6.  Severe scoliosis 7.  DVT Proph - on Lovenox 8.  Leukocytosis  WBC - 27,200 - 11/26/2013  Somewhat unclear what is causing this.  On antibiotics.  Chief Complaint  Patient presents with  . Emesis  . Dizziness  . Abdominal Pain   REFERRING PHYSICIAN: Kari Baars, MD  HISTORY OF PRESENT ILLNESS: Lindsey Hensley is a 80 y.o. (DOB: [birth year])  white  female whose primary care physician is Kari Baars, MD.  She is hospitalized for a SBO.  For a couple of weeks, she has been complaining of dizziness.  Then Friday night, 2/13, she could not eat.  Then Saturday, 2/14, she could not anything and was vomiting.  Her son, Lindsey Hensley, stays at night with her.  He goes to her houwse at 6:00 PM and then leaves at 10:00 AM the following.  She has no chronic GI problems.  She had an exploration for a tubal pregnancy in around 1962.  She had a lap chole by Dr. Carolynne Edouard in 2012.  She has been admitted since 11/24/2013 with no evidence of improvement in her SBO.  She also has a leukocystosis.   Past Medical History  Diagnosis Date  . Stroke   . GERD (gastroesophageal reflux disease)   . Arthritis     hands    Past Surgical History  Procedure Laterality Date  . Cholecystectomy    . Ectopic pregnancy surgery    . Hip arthroplasty Left 05/24/2013    Procedure: ARTHROPLASTY UNIPOLAR HIP;  Surgeon: Eulas Post, MD;  Location: [facility] OR;  Service:  Orthopedics;  Laterality: Left;      Current Facility-Administered Medications  Medication Dose Route Frequency Provider Last Rate Last Dose  . 0.9 %  sodium chloride infusion   Intravenous Continuous Ravisankar R Avva, MD 75 mL/hr at 11/26/13 0005    . acetaminophen (TYLENOL) tablet 650 mg  650 mg Oral Q6H PRN Ravisankar R Avva, MD       Or  . acetaminophen (TYLENOL) suppository 650 mg  650 mg Rectal Q6H PRN Ravisankar R Avva, MD   650 mg at 11/25/13 2315  . aspirin tablet 325 mg  325 mg Oral Daily Ravisankar R Avva, MD   325 mg at 11/25/13 1034  . ceFEPIme (MAXIPIME) 1 g in dextrose 5 % 50 mL IVPB  1 g Intravenous Q24H Lorenza Evangelist, RPH   1 g at 11/26/13 0002  . enoxaparin (LOVENOX) injection 30 mg  30 mg Subcutaneous Q24H Ravisankar R Avva, MD   30 mg at 11/25/13 2100  . HYDROcodone-acetaminophen (NORCO/VICODIN) 5-325 MG per tablet 1-2 tablet  1-2 tablet Oral Q4H PRN Ravisankar R Avva, MD      . multivitamin with minerals tablet 1 tablet  1 tablet Oral Daily Ravisankar R Avva, MD   1 tablet at 11/25/13 1034  .  ondansetron (ZOFRAN) injection 4 mg  4 mg Intravenous Q6H PRN Kari BaarsW Douglas Shaw, MD   4 mg at 11/25/13 2100  . pantoprazole (PROTONIX) injection 40 mg  40 mg Intravenous Q24H Ravisankar R Avva, MD   40 mg at 11/25/13 1248  . polyvinyl alcohol (LIQUIFILM TEARS) 1.4 % ophthalmic solution 2 drop  2 drop Both Eyes PRN W Buren Kosouglas Shaw, MD      . senna-docusate (Senokot-S) tablet 2 tablet  2 tablet Oral Daily Ravisankar R Avva, MD   2 tablet at 11/25/13 1034    Allergies  Allergen Reactions  . Sulfa Antibiotics Other (See Comments)    unknown   REVIEW OF SYSTEMS: Skin:  No history of rash.  No history of abnormal moles. Infection:  No history of hepatitis or HIV.  No history of MRSA. Neurologic:  History of right frontal stroke with left-sided hemiparesis resulting in gait instability dating back to May 2012.  Cardiac:  HTN on chart, but she is on no meds. Pulmonary:  Does not  smoke cigarettes.  No asthma or bronchitis.  No OSA/CPAP.  Endocrine:  No diabetes. No thyroid disease. Gastrointestinal:  No history of stomach disease.  No history of liver disease.  Status post laparoscopic cholecystectomy in 2010 Carolynne Edouard- Toth.  No history of pancreas disease.  No history of colon disease. Urologic:  No history of kidney stones.  No history of bladder infections. GYN: History of tubal pregnancy - 1962. Musculoskeletal:  She had left hip fracture n Aug 2014.  [facility]. Hematologic:  No bleeding disorder.  No history of anemia.  Not anticoagulated. Psycho-social:  The patient is oriented.   The patient has no obvious psychologic or social impairment to understanding our conversation and plan.  SOCIAL and FAMILY HISTORY: Widowed. Father - died [id number] Nanda QuintonSon, Lindsey Hensley has POA.  M: [phone], H: [phone]  PHYSICAL EXAM: BP 121/50  Pulse 86  Temp(Src) 99.5 F (37.5 C) (Oral)  Resp 18  Ht 5\' 4"  (1.626 m)  Wt 126 lb 4.8 oz (57.289 kg)  BMI 21.67 kg/m2  SpO2 92%  General: Older WF who is alert.  She cannot give a history and does not know where she is. HEENT: Normal. Pupils equal. Neck: Supple. No mass.  No thyroid mass. Lymph Nodes:  No supraclavicular or cervical nodes. Lungs: Clear to auscultation and symmetric breath sounds. Heart:  RRR. No murmur or rub.  Abdomen: Soft. No mass. No tenderness. No hernia. Normal bowel sounds.  Has old pfannenstiel incision.  Abdomen doughy.  She has no tenderness to explain her elevated WBC. Rectal: Not done. Extremities:  Good strength and ROM  in upper and lower extremities. Neurologic:  Grossly intact to motor and sensory function. Psychiatric: Has normal mood and affect. Behavior is normal.   DATA REVIEWED: Epic notes.  Ovidio Kinavid Heidi Lemay, MD,  [facility] [address]  [address]   [place], [place]    [id number] Phone:  [phone] FAX:  [phone]

## 2013-11-26 NOTE — Progress Notes (Signed)
Physical Therapy Treatment Patient Details Name: Lindsey CarnesRuth S Pollina MRN: [id number] DOB: 13-Aug-1922 Today's Date: 11/26/2013 Time: [id number] PT Time Calculation (min): 15 min  PT Assessment / Plan / Recommendation  History of Present Illness 80 y.o. female with PMH of CVA with L HP, L hip fx s/p hemiarthroplasy 6 months ago, admitted from home where she walked independently with a RW with nausea and vomiting.    PT Comments   Progressing with mobility. C/o some dizziness during session but pt was able to participate. Do not feel she will be safe to be home alone.   Follow Up Recommendations  Home health PT;Supervision/Assistance - 24 hour     Does the patient have the potential to tolerate intense rehabilitation     Barriers to Discharge        Equipment Recommendations  None recommended by PT    Recommendations for Other Services    Frequency Min 3X/week   Progress towards PT Goals Progress towards PT goals: Progressing toward goals  Plan Current plan remains appropriate    Precautions / Restrictions Precautions Precautions: Fall Restrictions Weight Bearing Restrictions: No   Pertinent Vitals/Pain No c/o pain    Mobility  Bed Mobility Overal bed mobility: Modified Independent General bed mobility comments: Pt c/o dizziness-denied spinning Transfers Overall transfer level: Needs assistance Transfers: Sit to/from Stand Sit to Stand: Min assist General transfer comment: Assist to rise, stabilize.  Ambulation/Gait Ambulation/Gait assistance: Min assist Ambulation Distance (Feet): 135 Feet Assistive device: Rolling walker (2 wheeled) Gait Pattern/deviations: Step-through pattern;Trunk flexed;Decreased stride length General Gait Details: Assist to stabilize throughout ambulation. VCS for posture, distance from walker.     Exercises     PT Diagnosis:    PT Problem List:   PT Treatment Interventions:     PT Goals (current goals can now be found in the care plan section)    Visit Information  Last PT Received On: 11/26/13 Assistance Needed: +1 History of Present Illness: 80 y.o. female with PMH of CVA with L HP, L hip fx s/p hemiarthroplasy 6 months ago, admitted from home where she walked independently with a RW with nausea and vomiting.     Subjective Data      Cognition  Cognition Arousal/Alertness: Awake/alert Behavior During Therapy: WFL for tasks assessed/performed Overall Cognitive Status: History of cognitive impairments - at baseline Memory: Decreased short-term memory    Balance     End of Session PT - End of Session Equipment Utilized During Treatment: Gait belt Activity Tolerance: Patient tolerated treatment well Patient left: in bed;with call bell/phone within reach;with bed alarm set   GP     Rebeca AlertJannie Exie Chrismer, MPT Pager: [phone]

## 2013-11-26 NOTE — Progress Notes (Signed)
OT Cancellation Note  Patient Details Name: Lindsey Hensley MRN: [id number] DOB: 02-15-1922   Cancelled Treatment:    Reason Eval/Treat Not Completed: Other (comment)  Nursing was assessing pt when I checked on her before 3:00.  They had found her long sitting on floor.  Did not have a chance to return later, so we will check on her tomorrow.   Kadesia Robel 11/26/2013, 4:14 PM Marica OtterMaryellen Laurette Villescas, OTR/L [phone] 11/26/2013

## 2013-11-27 ENCOUNTER — Encounter (HOSPITAL_COMMUNITY)
Admission: EM | Disposition: A | Discharge status: Skilled Nursing Facility | Payer: Self-pay | Source: Home / Self Care | Attending: Internal Medicine

## 2013-11-27 ENCOUNTER — Encounter (HOSPITAL_COMMUNITY): Payer: Medicare HMO | Admitting: Certified Registered Nurse Anesthetist

## 2013-11-27 ENCOUNTER — Inpatient Hospital Stay (HOSPITAL_COMMUNITY): Payer: Medicare HMO | Admitting: Certified Registered Nurse Anesthetist

## 2013-11-27 ENCOUNTER — Encounter (HOSPITAL_COMMUNITY): Payer: Self-pay | Admitting: Certified Registered Nurse Anesthetist

## 2013-11-27 ENCOUNTER — Inpatient Hospital Stay (HOSPITAL_COMMUNITY): Payer: Medicare HMO

## 2013-11-27 DIAGNOSIS — K565 Intestinal adhesions [bands], unspecified as to partial versus complete obstruction: Secondary | ICD-10-CM

## 2013-11-27 HISTORY — PX: COLON RESECTION: SHX5231

## 2013-11-27 LAB — CBC WITH DIFFERENTIAL/PLATELET
BASOS PCT: 0 % (ref 0–1)
Basophils Absolute: 0 10*3/uL (ref 0.0–0.1)
EOS ABS: 0 10*3/uL (ref 0.0–0.7)
EOS PCT: 0 % (ref 0–5)
HEMATOCRIT: 33.7 % — AB (ref 36.0–46.0)
Hemoglobin: 10.5 g/dL — ABNORMAL LOW (ref 12.0–15.0)
Lymphocytes Relative: 6 % — ABNORMAL LOW (ref 12–46)
Lymphs Abs: 1.9 10*3/uL (ref 0.7–4.0)
MCH: 29.2 pg (ref 26.0–34.0)
MCHC: 31.2 g/dL (ref 30.0–36.0)
MCV: 93.9 fL (ref 78.0–100.0)
Monocytes Absolute: 4.5 10*3/uL — ABNORMAL HIGH (ref 0.1–1.0)
Monocytes Relative: 14 % — ABNORMAL HIGH (ref 3–12)
NEUTROS ABS: 25.4 10*3/uL — AB (ref 1.7–7.7)
Neutrophils Relative %: 80 % — ABNORMAL HIGH (ref 43–77)
Platelets: 159 10*3/uL (ref 150–400)
RBC: 3.59 MIL/uL — AB (ref 3.87–5.11)
RDW: 14.3 % (ref 11.5–15.5)
WBC: 31.8 10*3/uL — ABNORMAL HIGH (ref 4.0–10.5)

## 2013-11-27 LAB — BASIC METABOLIC PANEL
BUN: 52 mg/dL — AB (ref 6–23)
CHLORIDE: 105 meq/L (ref 96–112)
CO2: 24 mEq/L (ref 19–32)
Calcium: 8.9 mg/dL (ref 8.4–10.5)
Creatinine, Ser: 1.47 mg/dL — ABNORMAL HIGH (ref 0.50–1.10)
GFR calc Af Amer: 35 mL/min — ABNORMAL LOW (ref >90)
GFR calc non Af Amer: 30 mL/min — ABNORMAL LOW (ref >90)
GLUCOSE: 99 mg/dL (ref 70–99)
POTASSIUM: 3.4 meq/L — AB (ref 3.7–5.3)
SODIUM: 145 meq/L (ref 137–147)

## 2013-11-27 SURGERY — COLON RESECTION LAPAROSCOPIC
Anesthesia: General | Site: Abdomen

## 2013-11-27 MED ORDER — LIDOCAINE HCL (CARDIAC) 20 MG/ML IV SOLN
INTRAVENOUS | Status: DC | PRN
  Administered 2013-11-27: 100 mg via INTRAVENOUS

## 2013-11-27 MED ORDER — MORPHINE SULFATE 10 MG/ML IJ SOLN
1.0000 mg | INTRAMUSCULAR | Status: DC | PRN
  Administered 2013-11-28 – 2013-11-30 (×3): 1 mg via INTRAVENOUS
  Administered 2013-12-01: 2 mg via INTRAVENOUS
  Filled 2013-11-27 (×4): qty 1

## 2013-11-27 MED ORDER — LACTATED RINGERS IV SOLN
INTRAVENOUS | Status: DC | PRN
  Administered 2013-11-27: 13:00:00 via INTRAVENOUS

## 2013-11-27 MED ORDER — PIPERACILLIN-TAZOBACTAM 3.375 G IVPB 30 MIN
3.3750 g | Freq: Three times a day (TID) | INTRAVENOUS | Status: DC
  Administered 2013-11-28 – 2013-12-05 (×23): 3.375 g via INTRAVENOUS
  Filled 2013-11-27 (×29): qty 50

## 2013-11-27 MED ORDER — LACTATED RINGERS IV SOLN
INTRAVENOUS | Status: DC
  Administered 2013-11-27: 1000 mL via INTRAVENOUS

## 2013-11-27 MED ORDER — EPHEDRINE SULFATE 50 MG/ML IJ SOLN
INTRAMUSCULAR | Status: DC | PRN
  Administered 2013-11-27: 10 mg via INTRAVENOUS

## 2013-11-27 MED ORDER — BUPIVACAINE HCL (PF) 0.25 % IJ SOLN
INTRAMUSCULAR | Status: AC
  Filled 2013-11-27: qty 30

## 2013-11-27 MED ORDER — DEXAMETHASONE SODIUM PHOSPHATE 10 MG/ML IJ SOLN
INTRAMUSCULAR | Status: DC | PRN
  Administered 2013-11-27: 10 mg via INTRAVENOUS

## 2013-11-27 MED ORDER — FENTANYL CITRATE 0.05 MG/ML IJ SOLN
INTRAMUSCULAR | Status: DC | PRN
  Administered 2013-11-27 (×5): 50 ug via INTRAVENOUS

## 2013-11-27 MED ORDER — PIPERACILLIN-TAZOBACTAM 3.375 G IVPB 30 MIN
3.3750 g | Freq: Once | INTRAVENOUS | Status: AC
  Administered 2013-11-27: 3.375 g via INTRAVENOUS
  Filled 2013-11-27: qty 50

## 2013-11-27 MED ORDER — FENTANYL CITRATE 0.05 MG/ML IJ SOLN
INTRAMUSCULAR | Status: AC
  Filled 2013-11-27: qty 2

## 2013-11-27 MED ORDER — ROCURONIUM BROMIDE 100 MG/10ML IV SOLN
INTRAVENOUS | Status: AC
  Filled 2013-11-27: qty 1

## 2013-11-27 MED ORDER — PROPOFOL 10 MG/ML IV BOLUS
INTRAVENOUS | Status: AC
  Filled 2013-11-27: qty 20

## 2013-11-27 MED ORDER — SUCCINYLCHOLINE CHLORIDE 20 MG/ML IJ SOLN
INTRAMUSCULAR | Status: DC | PRN
  Administered 2013-11-27: 100 mg via INTRAVENOUS

## 2013-11-27 MED ORDER — CISATRACURIUM BESYLATE 20 MG/10ML IV SOLN
INTRAVENOUS | Status: AC
  Filled 2013-11-27: qty 10

## 2013-11-27 MED ORDER — FENTANYL CITRATE 0.05 MG/ML IJ SOLN
25.0000 ug | INTRAMUSCULAR | Status: AC | PRN
  Administered 2013-11-27 (×6): 25 ug via INTRAVENOUS

## 2013-11-27 MED ORDER — ONDANSETRON HCL 4 MG/2ML IJ SOLN
INTRAMUSCULAR | Status: AC
  Filled 2013-11-27: qty 2

## 2013-11-27 MED ORDER — PROPOFOL 10 MG/ML IV BOLUS
INTRAVENOUS | Status: DC | PRN
  Administered 2013-11-27: 120 mg via INTRAVENOUS

## 2013-11-27 MED ORDER — SUCCINYLCHOLINE CHLORIDE 20 MG/ML IJ SOLN
INTRAMUSCULAR | Status: AC
  Filled 2013-11-27: qty 1

## 2013-11-27 MED ORDER — LABETALOL HCL 5 MG/ML IV SOLN
INTRAVENOUS | Status: DC | PRN
  Administered 2013-11-27 (×2): 5 mg via INTRAVENOUS

## 2013-11-27 MED ORDER — GLYCOPYRROLATE 0.2 MG/ML IJ SOLN
INTRAMUSCULAR | Status: DC | PRN
  Administered 2013-11-27: 0.6 mg via INTRAVENOUS

## 2013-11-27 MED ORDER — FENTANYL CITRATE 0.05 MG/ML IJ SOLN
INTRAMUSCULAR | Status: AC
  Filled 2013-11-27: qty 5

## 2013-11-27 MED ORDER — LIDOCAINE HCL (CARDIAC) 20 MG/ML IV SOLN
INTRAVENOUS | Status: AC
  Filled 2013-11-27: qty 5

## 2013-11-27 MED ORDER — CISATRACURIUM BESYLATE (PF) 10 MG/5ML IV SOLN
INTRAVENOUS | Status: DC | PRN
  Administered 2013-11-27: 2 mg via INTRAVENOUS
  Administered 2013-11-27: 10 mg via INTRAVENOUS

## 2013-11-27 MED ORDER — PHENYLEPHRINE HCL 10 MG/ML IJ SOLN
INTRAMUSCULAR | Status: AC
  Filled 2013-11-27: qty 1

## 2013-11-27 MED ORDER — ONDANSETRON HCL 4 MG/2ML IJ SOLN
INTRAMUSCULAR | Status: DC | PRN
  Administered 2013-11-27: 4 mg via INTRAVENOUS

## 2013-11-27 MED ORDER — DEXAMETHASONE SODIUM PHOSPHATE 10 MG/ML IJ SOLN
INTRAMUSCULAR | Status: AC
  Filled 2013-11-27: qty 1

## 2013-11-27 MED ORDER — LACTATED RINGERS IR SOLN
Status: DC | PRN
  Administered 2013-11-27: 1

## 2013-11-27 MED ORDER — PIPERACILLIN-TAZOBACTAM 3.375 G IVPB
INTRAVENOUS | Status: AC
  Filled 2013-11-27: qty 50

## 2013-11-27 MED ORDER — PHENYLEPHRINE HCL 10 MG/ML IJ SOLN
10.0000 mg | INTRAVENOUS | Status: DC | PRN
  Administered 2013-11-27: 25 ug/min via INTRAVENOUS

## 2013-11-27 MED ORDER — PROMETHAZINE HCL 25 MG/ML IJ SOLN: 6.2500 mg | INTRAMUSCULAR | Status: DC | PRN

## 2013-11-27 MED ORDER — NEOSTIGMINE METHYLSULFATE 1 MG/ML IJ SOLN
INTRAMUSCULAR | Status: DC | PRN
  Administered 2013-11-27: 5 mg via INTRAVENOUS

## 2013-11-27 MED ORDER — GLYCOPYRROLATE 0.2 MG/ML IJ SOLN
INTRAMUSCULAR | Status: AC
  Filled 2013-11-27: qty 3

## 2013-11-27 MED ORDER — FENTANYL CITRATE 0.05 MG/ML IJ SOLN
INTRAMUSCULAR | Status: AC
  Administered 2013-11-27: 25 ug
  Filled 2013-11-27: qty 2

## 2013-11-27 MED ORDER — LABETALOL HCL 5 MG/ML IV SOLN
INTRAVENOUS | Status: AC
  Filled 2013-11-27: qty 4

## 2013-11-27 MED ORDER — NEOSTIGMINE METHYLSULFATE 1 MG/ML IJ SOLN
INTRAMUSCULAR | Status: AC
  Filled 2013-11-27: qty 10

## 2013-11-27 MED ORDER — 0.9 % SODIUM CHLORIDE (POUR BTL) OPTIME
TOPICAL | Status: DC | PRN
  Administered 2013-11-27: 1000 mL
  Administered 2013-11-27: 4000 mL
  Administered 2013-11-27: 1000 mL

## 2013-11-27 MED ORDER — POTASSIUM CHLORIDE IN NACL 20-0.45 MEQ/L-% IV SOLN
INTRAVENOUS | Status: DC
  Administered 2013-11-27 – 2013-11-29 (×6): via INTRAVENOUS
  Administered 2013-11-30: 125 mL/h via INTRAVENOUS
  Filled 2013-11-27 (×12): qty 1000

## 2013-11-27 SURGICAL SUPPLY — 87 items
ADH SKN CLS APL DERMABOND .7 (GAUZE/BANDAGES/DRESSINGS)
APPLIER CLIP 5 13 M/L LIGAMAX5 (MISCELLANEOUS)
APPLIER CLIP ROT 10 11.4 M/L (STAPLE)
APR CLP MED LRG 11.4X10 (STAPLE)
APR CLP MED LRG 5 ANG JAW (MISCELLANEOUS)
BLADE EXTENDED COATED 6.5IN (ELECTRODE) IMPLANT
BLADE HEX COATED 2.75 (ELECTRODE) ×6 IMPLANT
BLADE SURG SZ10 CARB STEEL (BLADE) ×6 IMPLANT
CANISTER SUCTION 2500CC (MISCELLANEOUS) ×3 IMPLANT
CELLS DAT CNTRL 66122 CELL SVR (MISCELLANEOUS) IMPLANT
CLIP APPLIE 5 13 M/L LIGAMAX5 (MISCELLANEOUS) IMPLANT
CLIP APPLIE ROT 10 11.4 M/L (STAPLE) IMPLANT
CLOSURE WOUND 1/2 X4 (GAUZE/BANDAGES/DRESSINGS)
COUNTER NEEDLE 20 DBL MAG RED (NEEDLE) ×3 IMPLANT
COVER MAYO STAND STRL (DRAPES) ×6 IMPLANT
CUTTER FLEX LINEAR 45M (STAPLE) IMPLANT
DECANTER SPIKE VIAL GLASS SM (MISCELLANEOUS) ×3 IMPLANT
DERMABOND ADVANCED (GAUZE/BANDAGES/DRESSINGS)
DERMABOND ADVANCED .7 DNX12 (GAUZE/BANDAGES/DRESSINGS) IMPLANT
DRAPE LAPAROSCOPIC ABDOMINAL (DRAPES) ×3 IMPLANT
DRAPE LG THREE QUARTER DISP (DRAPES) IMPLANT
DRAPE UTILITY XL STRL (DRAPES) ×6 IMPLANT
DRAPE WARM FLUID 44X44 (DRAPE) ×3 IMPLANT
DRSG TELFA 3X8 NADH (GAUZE/BANDAGES/DRESSINGS) IMPLANT
ELECT REM PT RETURN 9FT ADLT (ELECTROSURGICAL) ×3
ELECTRODE REM PT RTRN 9FT ADLT (ELECTROSURGICAL) ×1 IMPLANT
FILTER SMOKE EVAC LAPAROSHD (FILTER) IMPLANT
GLOVE SURG SIGNA 7.5 PF LTX (GLOVE) ×6 IMPLANT
GOWN PREVENTION PLUS XXLARGE (GOWN DISPOSABLE) ×2 IMPLANT
GOWN STRL REIN XL XLG (GOWN DISPOSABLE) ×4 IMPLANT
GOWN STRL REUS W/TWL XL LVL3 (GOWN DISPOSABLE) ×18 IMPLANT
KIT BASIN OR (CUSTOM PROCEDURE TRAY) ×3 IMPLANT
LEGGING LITHOTOMY PAIR STRL (DRAPES) IMPLANT
LIGASURE IMPACT 36 18CM CVD LR (INSTRUMENTS) ×2 IMPLANT
NS IRRIG 1000ML POUR BTL (IV SOLUTION) ×3 IMPLANT
PAD ABD 8X10 STRL (GAUZE/BANDAGES/DRESSINGS) ×2 IMPLANT
PAD DRESSING TELFA 3X8 NADH (GAUZE/BANDAGES/DRESSINGS) IMPLANT
PENCIL BUTTON HOLSTER BLD 10FT (ELECTRODE) ×6 IMPLANT
RELOAD 45 VASCULAR/THIN (ENDOMECHANICALS) IMPLANT
RELOAD BLUE (STAPLE) ×3 IMPLANT
RELOAD PROXIMATE 75MM BLUE (ENDOMECHANICALS) ×6 IMPLANT
RELOAD STAPLE 45 2.5 WHT GRN (ENDOMECHANICALS) IMPLANT
RELOAD STAPLE 45 3.5 BLU ETS (ENDOMECHANICALS) IMPLANT
RELOAD STAPLE 75 3.8 BLU REG (ENDOMECHANICALS) IMPLANT
RELOAD STAPLE TA45 3.5 REG BLU (ENDOMECHANICALS) IMPLANT
RETRACTOR WND ALEXIS 18 MED (MISCELLANEOUS) IMPLANT
RTRCTR WOUND ALEXIS 18CM MED (MISCELLANEOUS)
SCALPEL HARMONIC ACE (MISCELLANEOUS) IMPLANT
SET IRRIG TUBING LAPAROSCOPIC (IRRIGATION / IRRIGATOR) ×3 IMPLANT
SLEEVE XCEL OPT CAN 5 100 (ENDOMECHANICALS) ×3 IMPLANT
SOLUTION ANTI FOG 6CC (MISCELLANEOUS) ×3 IMPLANT
SPONGE GAUZE 4X4 12PLY (GAUZE/BANDAGES/DRESSINGS) ×2 IMPLANT
SPONGE LAP 18X18 X RAY DECT (DISPOSABLE) ×4 IMPLANT
STAPLE ECHEON FLEX 60 POW ENDO (STAPLE) IMPLANT
STAPLER GUN LINEAR PROX 60 (STAPLE) ×2 IMPLANT
STAPLER PROXIMATE 75MM BLUE (STAPLE) ×2 IMPLANT
STAPLER VISISTAT 35W (STAPLE) IMPLANT
STRIP CLOSURE SKIN 1/2X4 (GAUZE/BANDAGES/DRESSINGS) IMPLANT
SUCTION POOLE TIP (SUCTIONS) ×5 IMPLANT
SUT MON AB 5-0 PS2 18 (SUTURE) ×3 IMPLANT
SUT NOV 1 T60/GS (SUTURE) ×4 IMPLANT
SUT NOVA NAB DX-16 0-1 5-0 T12 (SUTURE) ×4 IMPLANT
SUT PROLENE 2 0 KS (SUTURE) IMPLANT
SUT PROLENE 2 0 SH DA (SUTURE) IMPLANT
SUT SILK 2 0 (SUTURE) ×3
SUT SILK 2 0 SH CR/8 (SUTURE) ×5 IMPLANT
SUT SILK 2-0 18XBRD TIE 12 (SUTURE) ×1 IMPLANT
SUT SILK 3 0 (SUTURE) ×3
SUT SILK 3 0 SH CR/8 (SUTURE) ×7 IMPLANT
SUT SILK 3-0 18XBRD TIE 12 (SUTURE) ×1 IMPLANT
SUT VIC AB 2-0 CT2 27 (SUTURE) ×2 IMPLANT
SUT VIC AB 2-0 SH 27 (SUTURE) ×3
SUT VIC AB 2-0 SH 27X BRD (SUTURE) IMPLANT
SYR BULB IRRIGATION 50ML (SYRINGE) ×3 IMPLANT
TAPE CLOTH SURG 6X10 WHT LF (GAUZE/BANDAGES/DRESSINGS) ×2 IMPLANT
TOWEL OR 17X26 10 PK STRL BLUE (TOWEL DISPOSABLE) ×3 IMPLANT
TOWEL OR NON WOVEN STRL DISP B (DISPOSABLE) ×6 IMPLANT
TRAY FOLEY CATH 14FRSI W/METER (CATHETERS) ×3 IMPLANT
TRAY LAP CHOLE (CUSTOM PROCEDURE TRAY) ×3 IMPLANT
TROCAR BLADELESS OPT 5 100 (ENDOMECHANICALS) ×3 IMPLANT
TROCAR XCEL 12X100 BLDLESS (ENDOMECHANICALS) IMPLANT
TROCAR XCEL BLUNT TIP 100MML (ENDOMECHANICALS) IMPLANT
TROCAR XCEL NON-BLD 11X100MML (ENDOMECHANICALS) IMPLANT
TROCAR XCEL UNIV SLVE 11M 100M (ENDOMECHANICALS) IMPLANT
TUBING FILTER THERMOFLATOR (ELECTROSURGICAL) ×3 IMPLANT
YANKAUER SUCT BULB TIP 10FT TU (MISCELLANEOUS) ×6 IMPLANT
YANKAUER SUCT BULB TIP NO VENT (SUCTIONS) IMPLANT

## 2013-11-27 NOTE — Transfer of Care (Signed)
Immediate Anesthesia Transfer of Care Note  Patient: Dorris CarnesRuth S Caracci  Procedure(s) Performed: Procedure(s): DIAGNOSTIC LAPAROSCOPY OPEN RESECTION OF SMALL BOWEL (N/A)  Patient Location: PACU  Anesthesia Type:General  Level of Consciousness: awake, alert  and patient cooperative  Airway & Oxygen Therapy: Patient Spontanous Breathing and Patient connected to face mask oxygen  Post-op Assessment: Report given to PACU RN and Post -op Vital signs reviewed and stable  Post vital signs: Reviewed and stable  Complications: No apparent anesthesia complications

## 2013-11-27 NOTE — Preoperative (Signed)
Beta Blockers   Reason not to administer Beta Blockers:Not Applicable 

## 2013-11-27 NOTE — Progress Notes (Signed)
Patient found on floor, in no distress.  Patient stated that she got out of bed, was ambulating, felt dizzy, and sat on the ground.  No visible injuries, and patient stated that nothing hurts.  Blood pressure check was attempted, but could not get a valid reading.  Philomena Dohenyavid Yariana Hoaglund RN

## 2013-11-27 NOTE — Evaluation (Signed)
Occupational Therapy Evaluation Patient Details Name: Lindsey Hensley MRN: [id number] DOB: May 19, 1922 Today's Date: 11/27/2013 Time: 4098-1191 OT Time Calculation (min): 33 min  OT Assessment / Plan / Recommendation History of present illness 80 y.o. female with PMH of CVA with L HP, L hip fx s/p hemiarthroplasy 6 months ago, admitted from home where she walked independently with a RW with nausea and vomiting.    Clinical Impression   Pt was admitted for the above.  She will benefit from skilled OT to increase safety and independence with adls.  Pt was mod I prior to admission; goals in acute are set at min A/min guard.  Anticipate pt will need 24/7 vs. stsnf at d/c.    OT Assessment  Patient needs continued OT Services    Follow Up Recommendations  SNF;Supervision/Assistance - 24 hour    Barriers to Discharge      Equipment Recommendations  3 in 1 bedside comode (she likely has this)    Recommendations for Other Services    Frequency  Min 2X/week    Precautions / Restrictions Precautions Precautions: Fall Restrictions Weight Bearing Restrictions: No   Pertinent Vitals/Pain No pain reported    ADL  Grooming: Set up;Wash/dry hands;Wash/dry face Where Assessed - Grooming: Unsupported sitting Upper Body Bathing: Set up;Supervision/safety Where Assessed - Upper Body Bathing: Unsupported sitting Lower Body Bathing: Moderate assistance Where Assessed - Lower Body Bathing: Supported sit to stand Upper Body Dressing: Minimal assistance (iv) Where Assessed - Upper Body Dressing: Unsupported sitting Lower Body Dressing: Maximal assistance Where Assessed - Lower Body Dressing: Supported sit to stand Toilet Transfer: Minimal assistance Toilet Transfer Method: Surveyor, minerals: Materials engineer and Hygiene: Moderate assistance Where Assessed - Toileting Clothing Manipulation and Hygiene: Sit to stand from 3-in-1 or  toilet Equipment Used: Rolling walker Transfers/Ambulation Related to ADLs: spt to 3:1; cues for safety with RW ADL Comments: performed adl from eob to stand. pt asked same questions repeatedly.  cues for sequence/where she was with activity    OT Diagnosis: Generalized weakness  OT Problem List: Decreased strength;Decreased activity tolerance;Impaired balance (sitting and/or standing);Decreased cognition;Decreased safety awareness;Decreased knowledge of use of DME or AE OT Treatment Interventions: Self-care/ADL training;DME and/or AE instruction;Therapeutic activities;Cognitive remediation/compensation;Patient/family education;Balance training   OT Goals(Current goals can be found in the care plan section) Acute Rehab OT Goals Patient Stated Goal: none stated OT Goal Formulation: With patient Time For Goal Achievement: 12/11/13 Potential to Achieve Goals: Good ADL Goals Pt Will Perform Lower Body Bathing: with min assist;sit to/from stand Pt Will Perform Lower Body Dressing: with min assist;sit to/from stand Pt Will Transfer to Toilet: with min guard assist;bedside commode;ambulating Pt Will Perform Toileting - Clothing Manipulation and hygiene: with min assist;sit to/from stand  Visit Information  Last OT Received On: 11/27/13 Assistance Needed: +1 History of Present Illness: 81 y.o. female with PMH of CVA with L HP, L hip fx s/p hemiarthroplasy 6 months ago, admitted from home where she walked independently with a RW with nausea and vomiting.        Prior Functioning     Home Living Additional Comments: was alone with family checking on her Prior Function Level of Independence: Independent with assistive device(s) Communication Communication: No difficulties Dominant Hand: Right         Vision/Perception     Cognition  Cognition Arousal/Alertness: Awake/alert Behavior During Therapy: WFL for tasks assessed/performed Overall Cognitive Status: History of cognitive  impairments - at baseline Memory:  Decreased short-term memory (oriented to person and having sx.  )    Extremity/Trunk Assessment Upper Extremity Assessment Upper Extremity Assessment: Overall WFL for tasks assessed     Mobility Bed Mobility Overal bed mobility: Modified Independent General bed mobility comments: c/o dizziness Transfers Transfers: Sit to/from Stand Sit to Stand: Min assist General transfer comment: Assist to rise, stabilize.      Exercise     Balance General Comments General comments (skin integrity, edema, etc.): ? BPPV. Will further assess this on next visit.     End of Session OT - End of Session Activity Tolerance: Patient tolerated treatment well Patient left: in bed;with call bell/phone within reach;with bed alarm set (chair alarm also under sheets)  GO     Lindsey Hensley 11/27/2013, 9:26 AM Lindsey Hensley, OTR/L [phone] 11/27/2013

## 2013-11-27 NOTE — Progress Notes (Signed)
General Surgery Note  LOS: 3 days  POD -     Assessment/Plan: 1. SBO   On Cefepime   Continued SB distention on KUB - because of mental status, it is hard to tell if she has made any progress.  But I think that the best is to go to surgery today for exploration. 2. Dementia   Short term memory loss   The patient cannot tell me much of her problems.  3. History of stroke   History of right frontal stroke with left-sided hemiparesis resulting in gait instability dating back to 2012  4. History of left hip fx - 05/23/2013  J. Landau  5. HTN  6. Severe scoliosis  7. DVT Proph - on Lovenox  8. Leukocytosis   WBC - 31,800 - 11/27/2013   Somewhat unclear what is causing this.   On antibiotics.   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  Discussed with son, Roe Coombs.  Son, Renita Brocks has POA. M: [phone], H: [phone] Objective:   Filed Vitals:   11/27/13 0632  BP: 126/69  Pulse: 101  Temp: 98.3 F (36.8 C)  Resp: 20     Intake/Output from previous day:  02/17 0701 - 02/18 0700 In: 1500 [I.V.:1500] Out: 350 [Urine:350]  Intake/Output this shift:      Physical Exam:   General: older WF who is alert.   She is oriented to current time, but cannot remember anything   HEENT: Normal. Pupils equal. .   Lungs: Clear.   Abdomen: Soft, doughy.  No tenderness.   Lab Results:    Recent Labs  11/26/13 0535 11/27/13 0450  WBC 27.2* 31.8*  HGB 10.9* 10.5*  HCT 33.1* 33.7*  PLT 181 159    BMET   Recent Labs  11/26/13 0535 11/27/13 0450  NA 141 145  K 3.5* 3.4*  CL 101 105  CO2 28 24   GLUCOSE 129* 99  BUN 41* 52*  CREATININE 1.30* 1.47*  CALCIUM 8.3* 8.9    PT/INR  No results found for this basename: LABPROT, INR,  in the last 72 hours  ABG  No results found for this basename: PHART, PCO2, PO2, HCO3,  in the last 72 hours   Studies/Results:  Dg Chest 2 View  11/26/2013   CLINICAL DATA:  Cough and shortness of breath.  Pharyngitis.  EXAM: CHEST  2 VIEW  COMPARISON:  11/24/2013.  FINDINGS: Increased linear and patchy density in the right perihilar region and medial right lung base. The mildly progressive linear density at the medial left lung base with otherwise stable linear density at the left lung base. The cardiac silhouette remains borderline enlarged. Small bilateral pleural effusions, mildly increased. Diffuse osteopenia and left humeral bone island.  IMPRESSION: 1. Increased right perihilar and right basilar atelectasis and possible pneumonia. 2. Increased linear atelectasis or scarring at the left lung base. 3. Mild increase in size of small bilateral pleural effusions.   Electronically Signed   By: Gordan Payment M.D.   On: 11/26/2013 10:11   Dg Abd 2 Views  11/27/2013   CLINICAL DATA:  Abdominal distention.  EXAM: ABDOMEN - 2 VIEW  COMPARISON:  DG ABD 2 VIEWS dated 11/26/2013; DG ABD 2 VIEWS dated 11/25/2013; CT ABD/PELV WO CM dated 11/24/2013  FINDINGS: Soft tissue structures are unremarkable. Surgical clips noted in the right upper quadrant. Calcifications in pelvis consistent with fibroids. Aortoiliac atherosclerotic vascular disease. Dilated loops of small bowel remain. Dilatation has improved slightly from prior exam. There is mild small bowel wall thickening suggesting edema . The stomach is not distended. There is no free air. Bibasilar atelectasis present. Severe degenerative changes and scoliosis lumbar spine. Degenerative changes right hip. Left hip prosthesis.  IMPRESSION: 1. Interim slight improvement in small bowel distention. Small bowel distention remains. This  is consistent with small bowel obstruction. The stomach is nondistended. No free air. Very mild small bowel wall edema cannot be excluded. 2. Prior cholecystectomy. 3. Bibasilar subsegmental atelectasis. 4. Calcified fibroids. 5. Left hip prosthesis. 6. Severe degenerative changes and scoliosis lumbar spine.   Electronically Signed   By: Maisie Fushomas  Register   On: 11/27/2013 07:12   Dg Abd 2 Views  11/26/2013   CLINICAL DATA:  Abdominal pain and distention.  EXAM: ABDOMEN - 2 VIEW  COMPARISON:  Yesterday.  FINDINGS: Mild increase in caliber of multiple dilated small bowel loops containing multiple air-fluid levels. No colonic gas seen. No free peritoneal air. Cholecystectomy clips. Stable left hip prosthesis. Marked thoracolumbar degenerative changes and scoliosis are again demonstrated as well as small calcified uterine fibroids.  IMPRESSION: Progressive small bowel obstruction.   Electronically Signed   By: Gordan PaymentSteve  Reid M.D.   On: 11/26/2013 10:13   Dg Abd 2 Views  11/25/2013   CLINICAL DATA:  History of partial small bowel obstruction  EXAM: ABDOMEN - 2 VIEW  COMPARISON:  CT abdomen pelvis of 11/24/2013  FINDINGS: There are persistent air-fluid levels with gaseous distention of small bowel consistent with partial small bowel obstruction. No free air is seen on the left lateral decubitus of the abdomen. A thoracolumbar scoliosis is noted with diffuse degenerative change.  IMPRESSION: Persistent small bowel obstruction.  No free air.   Electronically Signed   By: Dwyane DeePaul  Barry M.D.   On: 11/25/2013 15:06     Anti-infectives:   Anti-infectives   Start     Dose/Rate Route Frequency Ordered Stop   11/25/13 2359  ceFEPIme (MAXIPIME) 1 g in dextrose 5 % 50 mL IVPB     1 g 100 mL/hr over 30 Minutes Intravenous Every 24 hours 11/25/13 2300     11/24/13 1800  ampicillin-sulbactam (UNASYN) 1.5 g in sodium chloride 0.9 % 50 mL IVPB  Status:  Discontinued     1.5 g 100 mL/hr over 30 Minutes Intravenous Every 12  hours 11/24/13 1549 11/25/13 0956      Ovidio Kinavid Harjit Leider, MD, FACS Pager: [phone] [facility] Office: [phone] 11/27/2013

## 2013-11-27 NOTE — Progress Notes (Signed)
Came to visit patient at bedside to explain and offer [facility] services. Spoke with patient's son, Dorinda HillDonald, at bedside. He states they have a nurse by the name of Amy who calls and comes to [facility]. States he thinks [facility] will be the same service they already have in place. Appreciative of the offer, however, he thinks it will be a duplication of services. Left [facility] brochure for him to call in the future when the [facility] case management program concludes.  Raiford NobleAtika Hall, MSN,RN,BSN- [facility] Hospital [phone]

## 2013-11-27 NOTE — Anesthesia Preprocedure Evaluation (Signed)
Anesthesia Evaluation  Patient identified by MRN, date of birth, ID band Patient awake    Reviewed: Allergy & Precautions, H&P , NPO status , Patient's Chart, lab work & pertinent test results  Airway Mallampati: II TM Distance: >3 FB Neck ROM: Full    Dental no notable dental hx.    Pulmonary former smoker,  breath sounds clear to auscultation  Pulmonary exam normal       Cardiovascular negative cardio ROS  Rhythm:Regular Rate:Normal     Neuro/Psych CVA, Residual Symptoms negative psych ROS   GI/Hepatic negative GI ROS, Neg liver ROS,   Endo/Other  negative endocrine ROS  Renal/GU Renal InsufficiencyRenal disease  negative genitourinary   Musculoskeletal negative musculoskeletal ROS (+)   Abdominal   Peds negative pediatric ROS (+)  Hematology  (+) anemia ,   Anesthesia Other Findings   Reproductive/Obstetrics negative OB ROS                           Anesthesia Physical Anesthesia Plan  ASA: III  Anesthesia Plan: General   Post-op Pain Management:    Induction: Intravenous, Rapid sequence and Cricoid pressure planned  Airway Management Planned: Oral ETT  Additional Equipment:   Intra-op Plan:   Post-operative Plan: Extubation in OR  Informed Consent: I have reviewed the patients History and Physical, chart, labs and discussed the procedure including the risks, benefits and alternatives for the proposed anesthesia with the patient or authorized representative who has indicated his/her understanding and acceptance.   Dental advisory given  Plan Discussed with: CRNA and Surgeon  Anesthesia Plan Comments:         Anesthesia Quick Evaluation

## 2013-11-27 NOTE — Anesthesia Postprocedure Evaluation (Signed)
  Anesthesia Post-op Note  Patient: Lindsey CarnesRuth S Hensley  Procedure(s) Performed: Procedure(s) (LRB): DIAGNOSTIC LAPAROSCOPY OPEN RESECTION OF SMALL BOWEL (N/A)  Patient Location: PACU  Anesthesia Type: General  Level of Consciousness: awake and alert   Airway and Oxygen Therapy: Patient Spontanous Breathing  Post-op Pain: mild  Post-op Assessment: Post-op Vital signs reviewed, Patient's Cardiovascular Status Stable, Respiratory Function Stable, Patent Airway and No signs of Nausea or vomiting  Last Vitals:  Filed Vitals:   11/27/13 1615  BP: 128/64  Pulse: 85  Temp:   Resp: 15    Post-op Vital Signs: stable   Complications: No apparent anesthesia complications

## 2013-11-27 NOTE — Progress Notes (Signed)
Subjective: Lindsey Hensley yesterday trying to get out of bed.  No apparent injury.  No complaints this morning.  Denies abdominal pain, back pain, hip pain, headache.  No BM.  Does not believe she is having flatus.  Objective: Vital signs in last 24 hours: Temp:  [97.5 F (36.4 C)-98.3 F (36.8 C)] 98.3 F (36.8 C) (02/18 09810632) Pulse Rate:  [87-104] 101 (02/18 0632) Resp:  [18-20] 20 (02/18 19140632) BP: (107-126)/(65-69) 126/69 mmHg (02/18 0632) SpO2:  [92 %-94 %] 92 % (02/18 78290632) Weight:  [57.5 kg (126 lb 12.2 oz)] 57.5 kg (126 lb 12.2 oz) (02/18 56210632) Weight change: -0.062 kg (-2.2 oz) Last BM Date: 11/23/13  CBG (last 3)  No results found for this basename: GLUCAP,  in the last 72 hours  Intake/Output from previous day: 02/17 0701 - 02/18 0700 In: 1500 [I.V.:1500] Out: 350 [Urine:350] Intake/Output this shift:    General appearance: alert and no distress Eyes: no scleral icterus Throat: oropharynx moist without erythema Resp: clear to auscultation bilaterally Cardio: regular rate and rhythm GI: slightly distended with absence of bowel sounds; nontender Extremities: no clubbing, cyanosis or edema   Lab Results:  Recent Labs  11/26/13 0535 11/27/13 0450  NA 141 145  K 3.5* 3.4*  CL 101 105  CO2 28 24  GLUCOSE 129* 99  BUN 41* 52*  CREATININE 1.30* 1.47*  CALCIUM 8.3* 8.9    Recent Labs  11/24/13 0950 11/25/13 0455  AST 21 24  ALT 8 12  ALKPHOS 57 60  BILITOT 1.4* 0.8  PROT 7.9 6.7  ALBUMIN 3.8 3.3*    Recent Labs  11/24/13 0950  11/26/13 0535 11/27/13 0450  WBC 30.4*  < > 27.2* 31.8*  NEUTROABS 23.1*  --   --  PENDING  HGB 13.9  < > 10.9* 10.5*  HCT 42.6  < > 33.1* 33.7*  MCV 92.4  < > 92.5 93.9  PLT 210  < > 181 159  < > = values in this interval not displayed. Lab Results  Component Value Date   INR 1.10 02/25/2011   No results found for this basename: CKTOTAL, CKMB, CKMBINDEX, TROPONINI,  in the last 72 hours  Recent Labs  11/25/13 0455   TSH 1.801   No results found for this basename: VITAMINB12, FOLATE, FERRITIN, TIBC, IRON, RETICCTPCT,  in the last 72 hours  Studies/Results: Dg Chest 2 View  11/26/2013   CLINICAL DATA:  Cough and shortness of breath.  Pharyngitis.  EXAM: CHEST  2 VIEW  COMPARISON:  11/24/2013.  FINDINGS: Increased linear and patchy density in the right perihilar region and medial right lung base. The mildly progressive linear density at the medial left lung base with otherwise stable linear density at the left lung base. The cardiac silhouette remains borderline enlarged. Small bilateral pleural effusions, mildly increased. Diffuse osteopenia and left humeral bone island.  IMPRESSION: 1. Increased right perihilar and right basilar atelectasis and possible pneumonia. 2. Increased linear atelectasis or scarring at the left lung base. 3. Mild increase in size of small bilateral pleural effusions.   Electronically Signed   By: Gordan PaymentSteve  Reid M.D.   On: 11/26/2013 10:11   Dg Abd 2 Views  11/26/2013   CLINICAL DATA:  Abdominal pain and distention.  EXAM: ABDOMEN - 2 VIEW  COMPARISON:  Yesterday.  FINDINGS: Mild increase in caliber of multiple dilated small bowel loops containing multiple air-fluid levels. No colonic gas seen. No free peritoneal air. Cholecystectomy clips. Stable left hip prosthesis. Marked  thoracolumbar degenerative changes and scoliosis are again demonstrated as well as small calcified uterine fibroids.  IMPRESSION: Progressive small bowel obstruction.   Electronically Signed   By: Gordan Payment M.D.   On: 11/26/2013 10:13   Dg Abd 2 Views  11/25/2013   CLINICAL DATA:  History of partial small bowel obstruction  EXAM: ABDOMEN - 2 VIEW  COMPARISON:  CT abdomen pelvis of 11/24/2013  FINDINGS: There are persistent air-fluid levels with gaseous distention of small bowel consistent with partial small bowel obstruction. No free air is seen on the left lateral decubitus of the abdomen. A thoracolumbar scoliosis is  noted with diffuse degenerative change.  IMPRESSION: Persistent small bowel obstruction.  No free air.   Electronically Signed   By: Dwyane Dee M.D.   On: 11/25/2013 15:06     Medications: Scheduled: . aspirin  325 mg Oral Daily  . ceFEPime (MAXIPIME) IV  1 g Intravenous Q24H  . enoxaparin (LOVENOX) injection  30 mg Subcutaneous Q24H  . multivitamin with minerals  1 tablet Oral Daily  . pantoprazole (PROTONIX) IV  40 mg Intravenous Q24H  . senna-docusate  2 tablet Oral Daily   Continuous: . sodium chloride 75 mL/hr at 11/27/13 0543    Assessment/Plan: Principal Problem:  1. Small bowel obstruction, partial- no improvement since admission radiographically or clinically.  Anticipate need for exploratory surgery- defer timing to Dr. Ezzard Standing. Active Problems:  2. Leukocytosis-continue empiric antibiotics due to fever and persistent leukocytosis though source remains unclear.  Abdominal exam and CT appear benign other than obstruction.  Low suspicion of bowel ischemia. 3. Acute on Chronic Renal Failure- monitor with ongoing hydration.  4. 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 May need additional services at home. Case Management consult to assist son with options.  5. History of CVA (cerebrovascular accident) with left hemiparesis-continue ASA.  6. Disposition- pending surgical intervention.  Continue PT/OT.    LOS: 3 days   SHAW,W DOUGLAS 11/27/2013, 7:12 AM

## 2013-11-27 NOTE — Progress Notes (Signed)
PIV removed by pt at 1945, left out until next antibiotic due at midnight. New PIV started, and Abx administered. This RN to continue to monitor.

## 2013-11-28 ENCOUNTER — Encounter (HOSPITAL_COMMUNITY): Payer: Self-pay | Admitting: Surgery

## 2013-11-28 LAB — CBC
HCT: 33.5 % — ABNORMAL LOW (ref 36.0–46.0)
HEMOGLOBIN: 10.6 g/dL — AB (ref 12.0–15.0)
MCH: 29.4 pg (ref 26.0–34.0)
MCHC: 31.6 g/dL (ref 30.0–36.0)
MCV: 93.1 fL (ref 78.0–100.0)
Platelets: 155 10*3/uL (ref 150–400)
RBC: 3.6 MIL/uL — ABNORMAL LOW (ref 3.87–5.11)
RDW: 14.4 % (ref 11.5–15.5)
WBC: 23.6 10*3/uL — AB (ref 4.0–10.5)

## 2013-11-28 LAB — BASIC METABOLIC PANEL
BUN: 46 mg/dL — ABNORMAL HIGH (ref 6–23)
CO2: 23 meq/L (ref 19–32)
Calcium: 8.3 mg/dL — ABNORMAL LOW (ref 8.4–10.5)
Chloride: 105 mEq/L (ref 96–112)
Creatinine, Ser: 1.18 mg/dL — ABNORMAL HIGH (ref 0.50–1.10)
GFR calc Af Amer: 46 mL/min — ABNORMAL LOW (ref >90)
GFR calc non Af Amer: 40 mL/min — ABNORMAL LOW (ref >90)
GLUCOSE: 124 mg/dL — AB (ref 70–99)
POTASSIUM: 4.3 meq/L (ref 3.7–5.3)
SODIUM: 143 meq/L (ref 137–147)

## 2013-11-28 NOTE — Progress Notes (Signed)
Physical Therapy Treatment Patient Details Name: Lindsey Hensley MRN: [id number] DOB: Jan 06, 1922 Today's Date: 11/28/2013 Time: 1137-1203 PT Time Calculation (min): 26 min  PT Assessment / Plan / Recommendation  History of Present Illness [age] y.o. female with PMH of CVA with L HP, L hip fx s/p hemiarthroplasy 6 months ago, admitted from home where she walked independently with a RW with nausea and vomiting. Now s/p exploratory laparotomy yesterday revealing bowel perforation    PT Comments   Pt ambulated in hallway with assist and RW.  Follow Up Recommendations  Home health PT;Supervision/Assistance - 24 hour     Does the patient have the potential to tolerate intense rehabilitation     Barriers to Discharge        Equipment Recommendations  None recommended by PT    Recommendations for Other Services    Frequency Min 3X/week   Progress towards PT Goals Progress towards PT goals: Progressing toward goals  Plan Current plan remains appropriate    Precautions / Restrictions Precautions Precautions: Fall Precaution Comments: NG tube, monitor sats   Pertinent Vitals/Pain SpO2 dropped to 85% on room air at rest so reapplied 2L O2 [place]    Mobility  Bed Mobility Overal bed mobility: Needs Assistance Bed Mobility: Supine to Sit;Sit to Supine Supine to sit: Min assist Sit to supine: Min assist General bed mobility comments: assist for trunk upright and LEs onto bed Transfers Overall transfer level: Needs assistance Equipment used: Rolling walker (2 wheeled) Transfers: Sit to/from Stand Sit to Stand: Min assist;From elevated surface General transfer comment: verbal cues for safety, assist to rise and control descent, pt min assist from elevated surface however mod assist from lower chair (rest break) Ambulation/Gait Ambulation/Gait assistance: Min assist Ambulation Distance (Feet): 140 Feet Assistive device: Rolling walker (2 wheeled) Gait Pattern/deviations: Step-through  pattern;Trunk flexed;Decreased stride length General Gait Details: assist to stabilize, verbal cues for posture and RW distance, SpO2 reading in 70s on 2L O2 and RN reports likely inaccurate (forehead pulse ox monitor), one seated rest break due to fatigue required    Exercises     PT Diagnosis:    PT Problem List:   PT Treatment Interventions:     PT Goals (current goals can now be found in the care plan section)    Visit Information  Last PT Received On: 11/28/13 Assistance Needed: +1 History of Present Illness: 80 y.o. female with PMH of CVA with L HP, L hip fx s/p hemiarthroplasy 6 months ago, admitted from home where she walked independently with a RW with nausea and vomiting. Now s/p exploratory laparotomy yesterday revealing bowel perforation     Subjective Data      Cognition  Cognition Arousal/Alertness: Awake/alert Behavior During Therapy: WFL for tasks assessed/performed Overall Cognitive Status: History of cognitive impairments - at baseline Memory: Decreased short-term memory    Balance     End of Session PT - End of Session Equipment Utilized During Treatment: Oxygen Activity Tolerance: Patient tolerated treatment well Patient left: in bed;with call bell/phone within reach;with bed alarm set   GP     Dima Ferrufino,KATHrine E 11/28/2013, 12:41 PM Zenovia JarredKati Ottilie Wigglesworth, PT, DPT 11/28/2013 Pager: [phone]

## 2013-11-28 NOTE — Op Note (Signed)
Lindsey Hensley, Lindsey Hensley                 ACCOUNT NO.:  [id number]  MEDICAL RECORD NO.:  [id number]  LOCATION:  1241                         FACILITY:  [facility]  PHYSICIAN:  Sandria Bales. Ezzard Standing, M.D.  DATE OF BIRTH:  [birth year]  DATE OF PROCEDURE:  11/27/2013                              OPERATIVE REPORT   PREOPERATIVE DIAGNOSIS:  Small bowel obstruction.  POSTOPERATIVE DIAGNOSIS:  Closed loop small bowel obstruction with perforation.  PROCEDURE:  Abdominal laparoscopy converted to open laparotomy with small bowel resection.  Hiatal hernia.  SURGEON:  Sandria Bales. Ezzard Standing, M.D.  FIRST ASSISTANT:  A. Maisie Fus, MD  ANESTHESIA:  General endotracheal.  ESTIMATED BLOOD LOSS:  Less than 100 mL.  DRAINS:  Drains left in were none.  INDICATION FOR PROCEDURE:  Lindsey Hensley is an 80 year old white female, who was admitted on Friday, November 22, 2013, for bowel obstruction. She is older and demented and could not keep an NG tube down and could not follow instructions well at all.  Her KUB x-ray showed persistent bowel obstruction.  She did have a leukocytosis preoperatively noted.  She comes to the operating room for abdominal exploration.  Her power of attorney is her son, Lindsey Hensley, and we had extensive discussion with him about the need for surgery and the potential risk.  OPERATIVE NOTE:  The patient was taken room #1 at [facility] where she underwent general endotracheal anesthesia supervised by Dr. Eilene Ghazi.  She was already on cefepime as an antibiotic, she had a Foley catheter placed, and she underwent general anesthesia.  A time-out was held and surgical checklist run.    Her abdomen was prepped with ChloraPrep and sterilely draped.  I went through an infraumbilical and cutdown into her abdominal cavity.  I placed a 12 mm Hasson trocar.   I placed a single 5 mm trocar in the left upper quadrant.  Laparoscopic abdominal exploration revealed dilated small bowel in her pelvis. She had  purulence in the pelvis and evidence of a bowel perforation, though I could not see the perforation.  I thought this could not be handled laparoscopically and thought she would be best served with an open laparotomy.  At this point, the Hasson trocar and the 5 mm trocar were removed.  I went through a midline, lower abdominal incision.  I used sharp dissection to get into the abdominal cavity. I explored the abdomen.  In the pelvis she had was a loop of small bowel (the length of small bowel about 8-10 inches) trapped as a closed loop obstruction in the pelvis and a tight adhesive band.  I lysed the band, got the small bowel up into the pelvis.  She had a closed loop obstruction of small bowel which was only about 8-10 inches proximal to the cecum.  I divided the distal small bowel and proximal small bowel with 75 mm GIA staplers.  I divided the mesentery with LigaSure and sent the specimen to Pathology.  I then did a side-to-side anastomosis with a stapled 75 Endo-GIA staple.  Because of ansd ischemic tip of one of the ends of the small bowel, I decided to use a TA-60 stapler across the  enterotomy.  This was fired; however, it crushed the bowel and some of the staples did not hold. Then, I oversewed the entire staple line with interrupted 2-0 silk suture.  This appeared to make an airtight closure.  The bowel looked viable.  She had 2-3 fingerbreadths as an opening between the 2 limbs of small bowel.  I did place a crotch stitch, and then irrigated the abdomen out with 6 L of saline.  The primary purulence was located in the pelvis.  I spilled a some of the bowel contents in the wound, this was irrigated out.  Her bowel was dilated.  I did not try to milk any of the fluid back.  She had an NG tube in proper position.  She had a hiatal hernia noted.  Her liver palpated, and it was unremarkable.  I closed peritoneum first with a running 2-0 Vicryl suture.  I then closed the abdomen with 2 running #1 Novafil  sutures.  I did place a couple of interrupted #1 Novafil sutures.  I packed the wound open, because of the contamination purulence. I put a single staple in the trocar site in the left upper quadrant.  Sponge and needle count were correct at the end of the case.  She tolerated procedure well, was transported to recovery room in good condition.  I plan to keep her overnight in the step-down ICU.  Sandria Balesavid H. Ezzard StandingNewman, M.D., FACS   DHN/MEDQ  D:  11/27/2013  T:  11/28/2013  Job:  [id number]  cc:   Kari BaarsW. Douglas Shaw, M.D. Fax: [phone]

## 2013-11-28 NOTE — Progress Notes (Signed)
Subjective: Underwent exploratory laparotomy yesterday revealing bowel perforation which was successfully repaired.  She denies complaints this morning- does not remember having surgery.  Denies abdominal pain.  Objective: Vital signs in last 24 hours: Temp:  [97.5 F (36.4 C)-99.2 F (37.3 C)] 97.5 F (36.4 C) (02/19 0400) Pulse Rate:  [72-110] 80 (02/19 0600) Resp:  [11-22] 11 (02/19 0600) BP: (101-145)/(51-94) 145/61 mmHg (02/19 0530) SpO2:  [90 %-100 %] 100 % (02/19 0600) Weight change:  Last BM Date: 11/23/13  CBG (last 3)  No results found for this basename: GLUCAP,  in the last 72 hours  Intake/Output from previous day: 02/18 0701 - 02/19 0700 In: 3437.5 [I.V.:3387.5; IV Piggyback:50] Out: 1380 [Urine:630; Emesis/NG output:650; Blood:100] Intake/Output this shift:    General appearance: alert and no distress Eyes: no scleral icterus Throat: oropharynx moist without erythema Resp: clear to auscultation bilaterally Cardio: regular rate and rhythm ZO:[id number] distended with abdominal dressing intact without blood or drainage.  Minimal diffuse abdominal tenderness; no rebound or guarding Extremities: no clubbing, cyanosis or edema/; SCDs in place   Lab Results:  Recent Labs  11/27/13 0450 11/28/13 0331  NA 145 143  K 3.4* 4.3  CL 105 105  CO2 24 23  GLUCOSE 99 124*  BUN 52* 46*  CREATININE 1.47* 1.18*  CALCIUM 8.9 8.3*   No results found for this basename: AST, ALT, ALKPHOS, BILITOT, PROT, ALBUMIN,  in the last 72 hours  Recent Labs  11/26/13 0535 11/27/13 0450  WBC 27.2* 31.8*  NEUTROABS  --  25.4*  HGB 10.9* 10.5*  HCT 33.1* 33.7*  MCV 92.5 93.9  PLT 181 159   Lab Results  Component Value Date   INR 1.10 02/25/2011   No results found for this basename: CKTOTAL, CKMB, CKMBINDEX, TROPONINI,  in the last 72 hours No results found for this basename: TSH, T4TOTAL, FREET3, T3FREE, THYROIDAB,  in the last 72 hours No results found for this basename:  VITAMINB12, FOLATE, FERRITIN, TIBC, IRON, RETICCTPCT,  in the last 72 hours  Studies/Results: Dg Chest 2 View  11/26/2013   CLINICAL DATA:  Cough and shortness of breath.  Pharyngitis.  EXAM: CHEST  2 VIEW  COMPARISON:  11/24/2013.  FINDINGS: Increased linear and patchy density in the right perihilar region and medial right lung base. The mildly progressive linear density at the medial left lung base with otherwise stable linear density at the left lung base. The cardiac silhouette remains borderline enlarged. Small bilateral pleural effusions, mildly increased. Diffuse osteopenia and left humeral bone island.  IMPRESSION: 1. Increased right perihilar and right basilar atelectasis and possible pneumonia. 2. Increased linear atelectasis or scarring at the left lung base. 3. Mild increase in size of small bilateral pleural effusions.   Electronically Signed   By: Gordan Payment M.D.   On: 11/26/2013 10:11   Dg Abd 2 Views  11/27/2013   CLINICAL DATA:  Abdominal distention.  EXAM: ABDOMEN - 2 VIEW  COMPARISON:  DG ABD 2 VIEWS dated 11/26/2013; DG ABD 2 VIEWS dated 11/25/2013; CT ABD/PELV WO CM dated 11/24/2013  FINDINGS: Soft tissue structures are unremarkable. Surgical clips noted in the right upper quadrant. Calcifications in pelvis consistent with fibroids. Aortoiliac atherosclerotic vascular disease. Dilated loops of small bowel remain. Dilatation has improved slightly from prior exam. There is mild small bowel wall thickening suggesting edema . The stomach is not distended. There is no free air. Bibasilar atelectasis present. Severe degenerative changes and scoliosis lumbar spine. Degenerative changes right hip. Left  hip prosthesis.  IMPRESSION: 1. Interim slight improvement in small bowel distention. Small bowel distention remains. This is consistent with small bowel obstruction. The stomach is nondistended. No free air. Very mild small bowel wall edema cannot be excluded. 2. Prior cholecystectomy. 3. Bibasilar  subsegmental atelectasis. 4. Calcified fibroids. 5. Left hip prosthesis. 6. Severe degenerative changes and scoliosis lumbar spine.   Electronically Signed   By: Maisie Fushomas  Register   On: 11/27/2013 07:12   Dg Abd 2 Views  11/26/2013   CLINICAL DATA:  Abdominal pain and distention.  EXAM: ABDOMEN - 2 VIEW  COMPARISON:  Yesterday.  FINDINGS: Mild increase in caliber of multiple dilated small bowel loops containing multiple air-fluid levels. No colonic gas seen. No free peritoneal air. Cholecystectomy clips. Stable left hip prosthesis. Marked thoracolumbar degenerative changes and scoliosis are again demonstrated as well as small calcified uterine fibroids.  IMPRESSION: Progressive small bowel obstruction.   Electronically Signed   By: Gordan PaymentSteve  Reid M.D.   On: 11/26/2013 10:13     Medications: Scheduled: . enoxaparin (LOVENOX) injection  30 mg Subcutaneous Q24H  . multivitamin with minerals  1 tablet Oral Daily  . pantoprazole (PROTONIX) IV  40 mg Intravenous Q24H  . piperacillin-tazobactam  3.375 g Intravenous Q8H  . senna-docusate  2 tablet Oral Daily   Continuous: . 0.45 % NaCl with KCl 20 mEq / L 125 mL/hr at 11/27/13 2030    Assessment/Plan: Principal Problem:  1. Small bowel obstruction with perforation- doing well after Ex Lap with bowel repair.  Post-op management per General Surgery. Active Problems:  2. Leukocytosis-secondary to bowel perforation.  Continue Unasyn.  Check CBC today and monitor. 3. Acute on Chronic Renal Failure- improved post-op. Continue hydration until po intake improves.  4. 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 5. History of CVA (cerebrovascular accident) with left hemiparesis-resume ASA when OK with GSU.  6. Disposition- per general surgery.  Resume PT/OT.  Ok to transfer to floor today if OK with surgery.  May need SNF rehab after discharge.  Primary management will be directed by General Surgery as she has no other active medical  issues currently.  I will continue to follow.   LOS: 4 days   Martha ClanShaw, William 11/28/2013, 7:56 AM

## 2013-11-28 NOTE — Progress Notes (Signed)
1 Day Post-Op  Subjective: Pt doing well, no complaints of pain.  No N/V.  NG put out approximately 1.5 canisters since surgery.  Pt asks if she had surgery.  NPO with NG tube  Objective: Vital signs in last 24 hours: Temp:  [96.7 F (35.9 C)-99.2 F (37.3 C)] 97.6 F (36.4 C) (02/19 1200) Pulse Rate:  [72-163] 72 (02/19 1400) Resp:  [11-22] 16 (02/19 1400) BP: (101-145)/(51-94) 123/57 mmHg (02/19 1400) SpO2:  [82 %-100 %] 100 % (02/19 1400) Last BM Date: 11/23/13  Intake/Output from previous day: 02/18 0701 - 02/19 0700 In: 3437.5 [I.V.:3387.5; IV Piggyback:50] Out: 1380 [Urine:630; Emesis/NG output:650; Blood:100] Intake/Output this shift: Total I/O In: 1097.9 [I.V.:1047.9; IV Piggyback:50] Out: 760 [Urine:160; Emesis/NG output:600]  PE: Gen:  Alert, NAD, pleasant Abd: Soft, NT/ND, decreased BS, no HSM, dressing clean and intact,  NG output recorded is 650mL, but believe it may be more like 1500mL since surgery (greenish output).   Lab Results:   Recent Labs  11/27/13 0450 11/28/13 0844  WBC 31.8* 23.6*  HGB 10.5* 10.6*  HCT 33.7* 33.5*  PLT 159 155   BMET  Recent Labs  11/27/13 0450 11/28/13 0331  NA 145 143  K 3.4* 4.3  CL 105 105  CO2 24 23  GLUCOSE 99 124*  BUN 52* 46*  CREATININE 1.47* 1.18*  CALCIUM 8.9 8.3*   PT/INR No results found for this basename: LABPROT, INR,  in the last 72 hours CMP     Component Value Date/Time   NA 143 11/28/2013 0331   K 4.3 11/28/2013 0331   CL 105 11/28/2013 0331   CO2 23 11/28/2013 0331   GLUCOSE 124* 11/28/2013 0331   BUN 46* 11/28/2013 0331   CREATININE 1.18* 11/28/2013 0331   CALCIUM 8.3* 11/28/2013 0331   PROT 6.7 11/25/2013 0455   ALBUMIN 3.3* 11/25/2013 0455   AST 24 11/25/2013 0455   ALT 12 11/25/2013 0455   ALKPHOS 60 11/25/2013 0455   BILITOT 0.8 11/25/2013 0455   GFRNONAA 40* 11/28/2013 0331   GFRAA 46* 11/28/2013 0331   Lipase     Component Value Date/Time   LIPASE 12 11/24/2013 0950        Studies/Results: Dg Abd 2 Views  11/27/2013   CLINICAL DATA:  Abdominal distention.  EXAM: ABDOMEN - 2 VIEW  COMPARISON:  DG ABD 2 VIEWS dated 11/26/2013; DG ABD 2 VIEWS dated 11/25/2013; CT ABD/PELV WO CM dated 11/24/2013  FINDINGS: Soft tissue structures are unremarkable. Surgical clips noted in the right upper quadrant. Calcifications in pelvis consistent with fibroids. Aortoiliac atherosclerotic vascular disease. Dilated loops of small bowel remain. Dilatation has improved slightly from prior exam. There is mild small bowel wall thickening suggesting edema . The stomach is not distended. There is no free air. Bibasilar atelectasis present. Severe degenerative changes and scoliosis lumbar spine. Degenerative changes right hip. Left hip prosthesis.  IMPRESSION: 1. Interim slight improvement in small bowel distention. Small bowel distention remains. This is consistent with small bowel obstruction. The stomach is nondistended. No free air. Very mild small bowel wall edema cannot be excluded. 2. Prior cholecystectomy. 3. Bibasilar subsegmental atelectasis. 4. Calcified fibroids. 5. Left hip prosthesis. 6. Severe degenerative changes and scoliosis lumbar spine.   Electronically Signed   By: Maisie Fushomas  Register   On: 11/27/2013 07:12    Anti-infectives: Anti-infectives   Start     Dose/Rate Route Frequency Ordered Stop   11/28/13 0000  piperacillin-tazobactam (ZOSYN) IVPB 3.375 g  3.375 g 12.5 mL/hr over 240 Minutes Intravenous Every 8 hours 11/27/13 1623     11/27/13 1500  piperacillin-tazobactam (ZOSYN) IVPB 3.375 g     3.375 g 100 mL/hr over 30 Minutes Intravenous  Once 11/27/13 1443 11/27/13 1445   11/25/13 2359  [MAR Hold]  ceFEPIme (MAXIPIME) 1 g in dextrose 5 % 50 mL IVPB  Status:  Discontinued     (On MAR Hold since 11/27/13 1345)   1 g 100 mL/hr over 30 Minutes Intravenous Every 24 hours 11/25/13 2300 11/27/13 1633   11/24/13 1800  ampicillin-sulbactam (UNASYN) 1.5 g in sodium  chloride 0.9 % 50 mL IVPB  Status:  Discontinued     1.5 g 100 mL/hr over 30 Minutes Intravenous Every 12 hours 11/24/13 1549 11/25/13 0956       Assessment/Plan 1.  Closed loop obstruction with perforation and small bowel obstruction POD #1 s/p Diagnostic laparoscopy converted to open laparotomy with small bowel resection (Dr Ezzard Standing - 11/27/13)  2. Dementia - Short term memory loss  The patient cannot tell me much of her problems.  3. History of stroke - History of right frontal stroke with left-sided hemiparesis resulting in gait instability dating back to 2012  4. History of left hip fx - 05/23/2013  J. Landau  5. HTN  6. Severe scoliosis  7. DVT Proph - on Lovenox and SCD's 8. Leukocytosis -WBC - down to 23.6 today   Plan: 1.  Await bowel function to return prior to advancing diet and removing NG tube 2.  Would keep in SDU for one more day as her sats went down in the 80's during my interview consistently anytime she would talk. 3.  Continue antibiotics, IVF, pain control, antiemetics prn 4.  SCD's and lovenox, would hold off on restarting aspirin for now, will discuss with Dr. Ezzard Standing about when to resume it 5.  Ambulate and IS    LOS: 4 days    Aris Georgia 11/28/2013, 3:12 PM Pager: [phone]  Agree with above. Pleasantly confused.  Has kept NGT in so far (much to my surprise). Agree with at least one more day in step down ICU.  Ovidio Kin, MD, [facility] Surgery Pager: [phone] Office phone:  [phone]

## 2013-11-28 NOTE — Progress Notes (Signed)
CARE MANAGEMENT NOTE 11/28/2013  Patient:  Lindsey Hensley, Lindsey Hensley   Account Number:  [id number]  Date Initiated:  11/26/2013  Documentation initiated by:  [facility]  Subjective/Objective Assessment:   80 year old female admitted with vomiting, SBO and dizziness.     Action/Plan:   From home, needs HH services at d/c.   Anticipated DC Date:  12/01/2013   Anticipated DC Plan:  [place]  CM consult      Choice offered to / List presented to:             [place].   Status of service:  In process, will continue to follow Medicare Important Message given?  NA - LOS <3 / Initial given by admissions (If response is "NO", the following Medicare IM given date fields will be blank) Date Medicare IM given:   Date Additional Medicare IM given:    Discharge Disposition:    Per UR Regulation:  Reviewed for med. necessity/level of care/duration of stay  If discussed at [place] of Stay Meetings, dates discussed:    Comments:  [id number]/Rhonda Eldridge Dace, BSN, [place] [phone] Chart Reviewed for discharge and hospital needs. Discharge needs at time of review:  None present will follow for needs. Review of patient progress due on [id number]. operative day [id number] sbo with colon resection, post-op sepsis, npo, iv flds,ngtube.  11/26/13 Lamerle Irvine RN BSN Met with pt and son at bedside to discuss [facility] services. PT has recommended HHPT services and son has chosen [place] to provide the services. I also provided him with the list of Private duty providers and let him know he would be responsible of setting up those servcies since they are not covered by her insurance company. He voiced understanding. [facility] has been made aware of choice.

## 2013-11-29 LAB — BASIC METABOLIC PANEL
BUN: 44 mg/dL — ABNORMAL HIGH (ref 6–23)
CALCIUM: 7.8 mg/dL — AB (ref 8.4–10.5)
CO2: 22 meq/L (ref 19–32)
CREATININE: 1 mg/dL (ref 0.50–1.10)
Chloride: 106 mEq/L (ref 96–112)
GFR calc Af Amer: 56 mL/min — ABNORMAL LOW (ref >90)
GFR, EST NON AFRICAN AMERICAN: 48 mL/min — AB (ref >90)
GLUCOSE: 112 mg/dL — AB (ref 70–99)
Potassium: 4.3 mEq/L (ref 3.7–5.3)
SODIUM: 139 meq/L (ref 137–147)

## 2013-11-29 LAB — CBC
HCT: 31.8 % — ABNORMAL LOW (ref 36.0–46.0)
Hemoglobin: 10.1 g/dL — ABNORMAL LOW (ref 12.0–15.0)
MCH: 29.7 pg (ref 26.0–34.0)
MCHC: 31.8 g/dL (ref 30.0–36.0)
MCV: 93.5 fL (ref 78.0–100.0)
PLATELETS: 174 10*3/uL (ref 150–400)
RBC: 3.4 MIL/uL — ABNORMAL LOW (ref 3.87–5.11)
RDW: 14.4 % (ref 11.5–15.5)
WBC: 23.5 10*3/uL — ABNORMAL HIGH (ref 4.0–10.5)

## 2013-11-29 MED ORDER — ENOXAPARIN SODIUM 40 MG/0.4ML ~~LOC~~ SOLN
40.0000 mg | SUBCUTANEOUS | Status: DC
  Administered 2013-11-29 – 2013-12-06 (×8): 40 mg via SUBCUTANEOUS
  Filled 2013-11-29 (×10): qty 0.4

## 2013-11-29 NOTE — Progress Notes (Signed)
Clinical Social Work Department CLINICAL SOCIAL WORK PLACEMENT NOTE 11/29/2013  Patient:  Lindsey Hensley,Lindsey Hensley  Account Number:  [id number] Admit date:  11/24/2013  Clinical Social Worker:  Orpah GreekKELLY FOLEY, LCSWA  Date/time:  11/29/2013 03:18 PM  Clinical Social Work is seeking post-discharge placement for this patient at the following level of care:   SKILLED NURSING   (*CSW will update this form in Epic as items are completed)   11/29/2013  Patient/family provided with [facility] Department of Clinical Social Work'[place] list of facilities offering this level of care within the geographic area requested by the patient (or if unable, by the patient'Hensley family).  11/29/2013  Patient/family informed of their freedom to choose among providers that offer the needed level of care, that participate in Medicare, Medicaid or managed care program needed by the patient, have an available bed and are willing to accept the patient.  11/29/2013  Patient/family informed of [facility]' ownership interest in [facility], as well as of the fact that they are under no obligation to receive care at this facility.  PASARR submitted to EDS on 11/29/2013 PASARR number received from EDS on 11/29/2013  FL2 transmitted to all facilities in geographic area requested by pt/family on  11/29/2013 FL2 transmitted to all facilities within larger geographic area on   Patient informed that his/her managed care company has contracts with or will negotiate with  certain facilities, including the following:   [facility] Medicare HMO     Patient/family informed of bed offers received:   Patient chooses bed at  Physician recommends and patient chooses bed at    Patient to be transferred to  on   Patient to be transferred to facility by   The following physician request were entered in Epic:   Additional Comments:   Lindsey MaxinKelly Nazanin Kinner, LCSW [facility] Clinical Social Worker cell #: [phone]

## 2013-11-29 NOTE — Progress Notes (Signed)
Subjective: "I'm ready to get out of here".  No complaints.  No recollection of surgery.  Denies abdominal pain.  NG tube remains in place.  Objective: Vital signs in last 24 hours: Temp:  [96.7 F (35.9 C)-98.1 F (36.7 C)] 98 F (36.7 C) (02/20 0428) Pulse Rate:  [27-163] 69 (02/20 0428) Resp:  [12-17] 13 (02/20 0428) BP: (123-140)/(57-64) 130/61 mmHg (02/20 0428) SpO2:  [82 %-100 %] 87 % (02/20 0600) Weight change:  Last BM Date: 11/23/13  CBG (last 3)  No results found for this basename: GLUCAP,  in the last 72 hours  Intake/Output from previous day: 02/19 0701 - 02/20 0700 In: 3882.5 [P.O.:720; I.V.:3000; NG/GT:50; IV Piggyback:112.5] Out: 2560 [Urine:910; Emesis/NG output:1650] Intake/Output this shift:    General appearance: alert and oriented to person and place; no acute distress Eyes: no scleral icterus Throat: oropharynx moist without erythema Resp: clear to auscultation bilaterally Cardio: regular rate and rhythm GI: soft, non-tender; bowel sounds absent Extremities: no clubbing, cyanosis or edema; SCDs in place  Lab Results:  Recent Labs  11/28/13 0331 11/29/13 0330  NA 143 139  K 4.3 4.3  CL 105 106  CO2 23 22  GLUCOSE 124* 112*  BUN 46* 44*  CREATININE 1.18* 1.00  CALCIUM 8.3* 7.8*   No results found for this basename: AST, ALT, ALKPHOS, BILITOT, PROT, ALBUMIN,  in the last 72 hours  Recent Labs  11/27/13 0450 11/28/13 0844 11/29/13 0330  WBC 31.8* 23.6* 23.5*  NEUTROABS 25.4*  --   --   HGB 10.5* 10.6* 10.1*  HCT 33.7* 33.5* 31.8*  MCV 93.9 93.1 93.5  PLT 159 155 174   Lab Results  Component Value Date   INR 1.10 02/25/2011   No results found for this basename: CKTOTAL, CKMB, CKMBINDEX, TROPONINI,  in the last 72 hours No results found for this basename: TSH, T4TOTAL, FREET3, T3FREE, THYROIDAB,  in the last 72 hours No results found for this basename: VITAMINB12, FOLATE, FERRITIN, TIBC, IRON, RETICCTPCT,  in the last 72  hours  Studies/Results: No results found.   Medications: Scheduled: . enoxaparin (LOVENOX) injection  30 mg Subcutaneous Q24H  . multivitamin with minerals  1 tablet Oral Daily  . pantoprazole (PROTONIX) IV  40 mg Intravenous Q24H  . piperacillin-tazobactam  3.375 g Intravenous Q8H  . senna-docusate  2 tablet Oral Daily   Continuous: . 0.45 % NaCl with KCl 20 mEq / L 125 mL/hr at 11/29/13 0534    Assessment/Plan: Principal Problem:  1. Small bowel obstruction with perforation- doing well after Ex Lap with bowel repair (POD #2). Post-op management per General Surgery.  Active Problems:  2. Leukocytosis-secondary to bowel perforation. Continue Unasyn. Slight improvement post-op. 3. Acute on Chronic Renal Failure- improved post-op. Continue hydration until po intake improves.  4. 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  No behavioral issues. 5. History of CVA (cerebrovascular accident) with left hemiparesis-resume ASA when OK with GSU.  6. Disposition- per general surgery. Resume PT/OT. Ok to transfer to floor today if OK with surgery. May need SNF rehab after discharge.   Primary management directed by General Surgery as she has no other active medical issues currently. We will continue to follow.    LOS: 5 days   Lindsey Hensley, Rane Blitch 11/29/2013, 7:42 AM

## 2013-11-29 NOTE — Progress Notes (Signed)
Physical Therapy Treatment Patient Details Name: Lindsey CarnesRuth S Allsbrook MRN: [id number] DOB: 05-03-1922 Today's Date: 11/29/2013 Time: [id number] PT Time Calculation (min): 28 min  PT Assessment / Plan / Recommendation  History of Present Illness 80 y.o. female with PMH of CVA with L HP, L hip fx s/p hemiarthroplasy 6 months ago, admitted from home where she walked independently with a RW with nausea and vomiting. Now s/p exploratory laparotomy yesterday revealing bowel perforation    PT Comments   Exp Lap POD # 2.  Assisted pt OOB + 2 assist to amb limited distance in hallway.   Pt will need [facility] at SNF prior to D/C to home.    Follow Up Recommendations  SNF     Does the patient have the potential to tolerate intense rehabilitation     Barriers to Discharge        Equipment Recommendations       Recommendations for Other Services    Frequency Min 3X/week   Progress towards PT Goals Progress towards PT goals: Progressing toward goals  Plan Current plan remains appropriate    Precautions / Restrictions Precautions Precautions: Fall Precaution Comments: NG tube, monitor sats, s/p CVA L hemi Restrictions Weight Bearing Restrictions: No   Pertinent Vitals/Pain C/o 6/10 ABD pain C/o MAX dizziness with act BP EOB 119/54 BP 141/56    Mobility  Bed Mobility Overal bed mobility: Needs Assistance Bed Mobility: Supine to Sit Supine to sit: +2 for safety/equipment;+2 for physical assistance;Mod assist General bed mobility comments: HOB elevated 45 degrees and increased time to self rise and scoot to EOB due to ABD discomfort POD # 2 Exp Lap Transfers Overall transfer level: Needs assistance Equipment used: Rolling walker (2 wheeled) Transfers: Sit to/from Stand Sit to Stand: +2 physical assistance;+2 safety/equipment;Mod assist General transfer comment: 25% VC's on proper tech and hand placem,ent esp stand to sit to control decend as pt fatigues quickly.    Ambulation/Gait Ambulation/Gait assistance: +2 physical assistance;+2 safety/equipment;Min assist Ambulation Distance (Feet): 65 Feet (20 feet, 45 feet with one sitting rest break) Assistive device: Rolling walker (2 wheeled) Gait Pattern/deviations: Step-through pattern;Decreased step length - right;Decreased step length - left;Trunk flexed;Staggering left Gait velocity: decreased General Gait Details: limited activity tolerance and MAX c/o dizzyness.  Monitored BP and O2 sats on 2 lts.  Chair following behind for safety.  One seated rest break.  Unsteady gait.      PT Goals (current goals can now be found in the care plan section)    Visit Information  Last PT Received On: 11/29/13 Assistance Needed: +1 History of Present Illness: 80 y.o. female with PMH of CVA with L HP, L hip fx s/p hemiarthroplasy 6 months ago, admitted from home where she walked independently with a RW with nausea and vomiting. Now s/p exploratory laparotomy yesterday revealing bowel perforation     Subjective Data      Cognition       Balance     End of Session PT - End of Session Equipment Utilized During Treatment: Gait belt;Oxygen Activity Tolerance: Patient limited by fatigue Patient left: in chair;with call bell/phone within reach   Felecia ShellingLori Fransico Sciandra  PTA [facility]  Acute  Rehab Pager      [phone]

## 2013-11-29 NOTE — Progress Notes (Addendum)
General Surgery Note  LOS: 5 days  POD -  2 Days Post-Op PCP - Dr. Shelda Altes. Shaw  Assessment/Plan: 1.  DIAGNOSTIC LAPAROSCOPY, OPEN RESECTION OF SMALL BOWEL - 11/27/2013 - D. Josy Peaden  For closed loop small bowel obstruction with perforation  Zosyn - started 2/18 >>>  Still with ileus - still NGT 1A.  Open wound   Somehow, Epic is dropping dressing change orders.  I put them in again.  2.  DVT prophylaxis - Lovenox 3.  Continued leukocytosis 4.  Dementia   Short term memory loss   The patient cannot tell me much of her problems.  5. History of stroke   History of right frontal stroke with left-sided hemiparesis resulting in gait instability dating back to 2012  6. History of left hip fx - 05/23/2013 - J. Landau  7. HTN  8. Severe scoliosis 9.  Foley - to try to get this out 10. 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  Objective:   Filed Vitals:   11/29/13 1200  BP: 163/61  Pulse: 59  Temp: 97.5 F (36.4 C)  Resp: 24     Intake/Output from previous day:  02/19 0701 - 02/20 0700 In: 4007.5 [P.O.:720; I.V.:3125; NG/GT:50; IV Piggyback:112.5] Out: 2560 [Urine:910; Emesis/NG output:1650]  Intake/Output this shift:  Total I/O In: 795 [P.O.:120; I.V.:625; IV Piggyback:50] Out: 510 [Urine:210; Emesis/NG output:300]   Physical Exam:   General: WN older WF who is alert, but has no memory.    HEENT: Normal. Pupils equal. .   Lungs: clear   Abdomen: Soft, quiet.   Wound: Clean.  (somehow, dressing change orders are dropped form the Epic orders that I put in)   Lab  Results:    Recent Labs  11/28/13 0844 11/29/13 0330  WBC 23.6* 23.5*  HGB 10.6* 10.1*  HCT 33.5* 31.8*  PLT 155 174    BMET   Recent Labs  11/28/13 0331 11/29/13 0330  NA 143 139  K 4.3 4.3  CL 105 106  CO2 23 22  GLUCOSE 124* 112*  BUN 46* 44*  CREATININE 1.18* 1.00  CALCIUM 8.3* 7.8*    PT/INR  No results found for this basename: LABPROT, INR,  in the last 72 hours  ABG  No results found for this basename: PHART, PCO2, PO2, HCO3,  in the last 72 hours   Studies/Results:  No results found.   Anti-infectives:   Anti-infectives   Start     Dose/Rate Route Frequency Ordered Stop   11/28/13 0000  piperacillin-tazobactam (ZOSYN) IVPB 3.375 g     3.375 g 12.5 mL/hr over 240 Minutes Intravenous Every 8 hours 11/27/13 1623     11/27/13 1500  piperacillin-tazobactam (ZOSYN) IVPB 3.375 g     3.375 g 100 mL/hr over 30 Minutes Intravenous  Once 11/27/13 1443 11/27/13 1445   11/25/13 2359  [MAR Hold]  ceFEPIme (MAXIPIME) 1 g in dextrose 5 % 50 mL IVPB  Status:  Discontinued     (On MAR Hold since 11/27/13 1345)   1 g 100 mL/hr over 30 Minutes  Intravenous Every 24 hours 11/25/13 2300 11/27/13 1633   11/24/13 1800  ampicillin-sulbactam (UNASYN) 1.5 g in sodium chloride 0.9 % 50 mL IVPB  Status:  Discontinued     1.5 g 100 mL/hr over 30 Minutes Intravenous Every 12 hours 11/24/13 1549 11/25/13 0956      Ovidio Kin, MD, FACS Pager: [phone] [facility] Office: [phone] 11/29/2013

## 2013-11-29 NOTE — Progress Notes (Signed)
Clinical Social Work Department BRIEF PSYCHOSOCIAL ASSESSMENT 11/29/2013  Patient:  Lindsey Hensley,Lindsey Hensley     Account Number:  [id number]     Admit date:  11/24/2013  Clinical Social Worker:  Orpah GreekFOLEY,Jonia Oakey, LCSWA  Date/Time:  11/29/2013 03:07 PM  Referred by:  Physician  Date Referred:  11/29/2013 Referred for  SNF Placement   Other Referral:   Interview type:  Patient Other interview type:   and son, Lindsey CoombsDon at bedside    PSYCHOSOCIAL DATA Living Status:  WITH ADULT CHILDREN Admitted from facility:   Level of care:   Primary support name:  Lindsey Hensley (son) h#: [phone] c#: [id number] Primary support relationship to patient:  CHILD, ADULT Degree of support available:   good    CURRENT CONCERNS Current Concerns  Post-Acute Placement   Other Concerns:    SOCIAL WORK ASSESSMENT / PLAN CSW reviewed PT evaluation recommending SNF for patient at discharge.   Assessment/plan status:  [facility] Other assessment/ plan:   Information/referral to community resources:   CSW completed FL2 and [facility].    PATIENT'Hensley/FAMILY'Hensley RESPONSE TO PLAN OF CARE: Patient'Hensley son agreeable to have information faxed out to the facilities in case she still needs SNF at discharge, but felt that discharge planning discussion was too soon. Note patient does currently have an NG tube, and in ICU - CSW reassured son that perhaps next week, patient will have progressed with PT/OT here in the hospital and will only need Home Health services at discharge.    Son states that [facility] but only stayed 1 night because she was unhappy with the care there. CSW to follow-up early next week.       Lindsey MaxinKelly Taeja Debellis, LCSW [facility] Clinical Social Worker cell #: [phone]

## 2013-11-30 LAB — CBC
HCT: 35.7 % — ABNORMAL LOW (ref 36.0–46.0)
Hemoglobin: 11.2 g/dL — ABNORMAL LOW (ref 12.0–15.0)
MCH: 29.2 pg (ref 26.0–34.0)
MCHC: 31.4 g/dL (ref 30.0–36.0)
MCV: 93.2 fL (ref 78.0–100.0)
PLATELETS: 179 10*3/uL (ref 150–400)
RBC: 3.83 MIL/uL — AB (ref 3.87–5.11)
RDW: 14.5 % (ref 11.5–15.5)
WBC: 16.8 10*3/uL — AB (ref 4.0–10.5)

## 2013-11-30 LAB — BASIC METABOLIC PANEL
BUN: 33 mg/dL — ABNORMAL HIGH (ref 6–23)
CALCIUM: 8.2 mg/dL — AB (ref 8.4–10.5)
CO2: 22 mEq/L (ref 19–32)
CREATININE: 0.91 mg/dL (ref 0.50–1.10)
Chloride: 104 mEq/L (ref 96–112)
GFR, EST AFRICAN AMERICAN: 63 mL/min — AB (ref >90)
GFR, EST NON AFRICAN AMERICAN: 54 mL/min — AB (ref >90)
Glucose, Bld: 75 mg/dL (ref 70–99)
Potassium: 4.8 mEq/L (ref 3.7–5.3)
SODIUM: 138 meq/L (ref 137–147)

## 2013-11-30 MED ORDER — CHLORHEXIDINE GLUCONATE 0.12 % MT SOLN
15.0000 mL | Freq: Two times a day (BID) | OROMUCOSAL | Status: DC
  Administered 2013-11-30 – 2013-12-14 (×25): 15 mL via OROMUCOSAL
  Filled 2013-11-30 (×31): qty 15

## 2013-11-30 MED ORDER — BIOTENE DRY MOUTH MT LIQD
15.0000 mL | Freq: Two times a day (BID) | OROMUCOSAL | Status: DC
  Administered 2013-12-01 – 2013-12-14 (×21): 15 mL via OROMUCOSAL

## 2013-11-30 MED ORDER — KCL IN DEXTROSE-NACL 20-5-0.45 MEQ/L-%-% IV SOLN
INTRAVENOUS | Status: DC
  Administered 2013-11-30 (×2): via INTRAVENOUS
  Administered 2013-12-01: 1000 mL via INTRAVENOUS
  Administered 2013-12-02 (×2): via INTRAVENOUS
  Administered 2013-12-03: 100 mL/h via INTRAVENOUS
  Filled 2013-11-30 (×11): qty 1000

## 2013-11-30 NOTE — Progress Notes (Signed)
Patient ID: Lindsey CarnesRuth S Williamson, female   DOB: 26-May-1922, 80 y.o.   MRN: [id number]  General Surgery - [facility] - Progress Note  POD# 3  Subjective: Patient in bed.  Pleasant.  Mild confusion.  No complaints.  NG tube in place.  No BM's.  Objective: Vital signs in last 24 hours: Temp:  [97.5 F (36.4 C)-98 F (36.7 C)] 97.6 F (36.4 C) (02/21 0334) Pulse Rate:  [59-105] 78 (02/21 0334) Resp:  [17-24] 17 (02/21 0334) BP: (144-171)/(60-70) 162/70 mmHg (02/21 0334) SpO2:  [95 %-99 %] 97 % (02/21 0334) Weight:  [135 lb (61.236 kg)] 135 lb (61.236 kg) (02/20 1200) Last BM Date: 11/23/13  Intake/Output from previous day: 02/20 0701 - 02/21 0700 In: 3535 [P.O.:510; I.V.:2875; IV Piggyback:150] Out: 2260 [Urine:910; Emesis/NG output:1350]  Exam: HEENT - clear, not icteric Neck - soft Chest - clear bilaterally Cor - RRR, no murmur Abd - soft, mild distension; dressing intact; no BS heard on auscultation; small NG output Ext - no significant edema Neuro - grossly intact, no focal deficits  Lab Results:   Recent Labs  11/29/13 0330 11/30/13 0330  WBC 23.5* 16.8*  HGB 10.1* 11.2*  HCT 31.8* 35.7*  PLT 174 179     Recent Labs  11/29/13 0330 11/30/13 0330  NA 139 138  K 4.3 4.8  CL 106 104  CO2 22 22  GLUCOSE 112* 75  BUN 44* 33*  CREATININE 1.00 0.91  CALCIUM 7.8* 8.2*    Studies/Results: No results found.  Assessment / Plan: 1. DIAGNOSTIC LAPAROSCOPY, OPEN RESECTION OF SMALL BOWEL - 11/27/2013 - Dr. Ovidio Kinavid Newman - for closed loop small bowel obstruction with perforation   Zosyn - started 2/18  Continue NG tube until bowel function returns  Open wound care 2. DVT prophylaxis - Lovenox  3. Continued leukocytosis  4. Dementia   Short term memory loss   5. History of stroke   History of right frontal stroke with left-sided hemiparesis resulting in gait instability 6. History of left hip fx - 05/23/2013 - J. Landau  7. HTN  8. Severe  scoliosis    Velora Hecklerodd M. Janaye Corp, MD, [facility] Office: [phone]  11/30/2013

## 2013-11-30 NOTE — Progress Notes (Signed)
Subjective: She feels fine and does not know why she is here. She denies having abdominal pain or difficulty breathing  Objective: Vital signs in last 24 hours: Temp:  [97.5 F (36.4 C)-98 F (36.7 C)] 97.6 F (36.4 C) (02/21 0334) Pulse Rate:  [59-105] 78 (02/21 0334) Resp:  [14-24] 17 (02/21 0334) BP: (137-171)/(59-70) 162/70 mmHg (02/21 0334) SpO2:  [95 %-99 %] 97 % (02/21 0334) Weight:  [61.236 kg (135 lb)] 61.236 kg (135 lb) (02/20 1200) Weight change:    Intake/Output from previous day: 02/20 0701 - 02/21 0700 In: 3535 [P.O.:510; I.V.:2875; IV Piggyback:150] Out: 2260 [Urine:910; Emesis/NG output:1350]   General appearance: alert, cooperative and no distress Resp: clear to auscultation bilaterally Cardio: regular rate and rhythm GI: bowel sounds absent, no tenderness  Lab Results:  Recent Labs  11/29/13 0330 11/30/13 0330  WBC 23.5* 16.8*  HGB 10.1* 11.2*  HCT 31.8* 35.7*  PLT 174 179   BMET  Recent Labs  11/29/13 0330 11/30/13 0330  NA 139 138  K 4.3 4.8  CL 106 104  CO2 22 22  GLUCOSE 112* 75  BUN 44* 33*  CREATININE 1.00 0.91  CALCIUM 7.8* 8.2*   CMET CMP     Component Value Date/Time   NA 138 11/30/2013 0330   K 4.8 11/30/2013 0330   CL 104 11/30/2013 0330   CO2 22 11/30/2013 0330   GLUCOSE 75 11/30/2013 0330   BUN 33* 11/30/2013 0330   CREATININE 0.91 11/30/2013 0330   CALCIUM 8.2* 11/30/2013 0330   PROT 6.7 11/25/2013 0455   ALBUMIN 3.3* 11/25/2013 0455   AST 24 11/25/2013 0455   ALT 12 11/25/2013 0455   ALKPHOS 60 11/25/2013 0455   BILITOT 0.8 11/25/2013 0455   GFRNONAA 54* 11/30/2013 0330   GFRAA 63* 11/30/2013 0330    CBG (last 3)  No results found for this basename: GLUCAP,  in the last 72 hours  INR RESULTS:   Lab Results  Component Value Date   INR 1.10 02/25/2011     Studies/Results: No results found.  Medications: I have reviewed the patient's current medications.  Assessment/Plan: #1 Leukocytosis: improving with broad  spectrum antibiotics. #2 Alzheimers disease: stable with no anxiety or hallucinations and she defers medical decisions to her son. #3 Protein calorie malnutrition: moderate and she will get supplements when able to take po intake   LOS: 6 days   Adhya Cocco G 11/30/2013, 7:53 AM

## 2013-12-01 LAB — BASIC METABOLIC PANEL
BUN: 19 mg/dL (ref 6–23)
CO2: 23 mEq/L (ref 19–32)
Calcium: 7.9 mg/dL — ABNORMAL LOW (ref 8.4–10.5)
Chloride: 102 mEq/L (ref 96–112)
Creatinine, Ser: 0.72 mg/dL (ref 0.50–1.10)
GFR, EST AFRICAN AMERICAN: 86 mL/min — AB (ref >90)
GFR, EST NON AFRICAN AMERICAN: 74 mL/min — AB (ref >90)
GLUCOSE: 122 mg/dL — AB (ref 70–99)
POTASSIUM: 4.2 meq/L (ref 3.7–5.3)
Sodium: 136 mEq/L — ABNORMAL LOW (ref 137–147)

## 2013-12-01 LAB — CBC
HEMATOCRIT: 36.7 % (ref 36.0–46.0)
HEMOGLOBIN: 11.7 g/dL — AB (ref 12.0–15.0)
MCH: 29 pg (ref 26.0–34.0)
MCHC: 31.9 g/dL (ref 30.0–36.0)
MCV: 90.8 fL (ref 78.0–100.0)
Platelets: 200 10*3/uL (ref 150–400)
RBC: 4.04 MIL/uL (ref 3.87–5.11)
RDW: 14 % (ref 11.5–15.5)
WBC: 15.8 10*3/uL — AB (ref 4.0–10.5)

## 2013-12-01 MED ORDER — MORPHINE SULFATE 2 MG/ML IJ SOLN
1.0000 mg | INTRAMUSCULAR | Status: DC | PRN
  Administered 2013-12-01 – 2013-12-05 (×4): 2 mg via INTRAVENOUS
  Filled 2013-12-01 (×4): qty 1

## 2013-12-01 NOTE — Progress Notes (Signed)
Patient ID: Lindsey CarnesRuth S Blakeman, female   DOB: Feb 25, 1922, 80 y.o.   MRN: [id number]  General Surgery - [facility] - Progress Note  POD# 4  Subjective: Patient getting up to bedside commode - no BM's or flatus yet.  NG tube in place with 450 out last night.  Objective: Vital signs in last 24 hours: Temp:  [97.5 F (36.4 C)-98.3 F (36.8 C)] 97.9 F (36.6 C) (02/22 0400) Pulse Rate:  [74-81] 76 (02/22 0400) Resp:  [13-19] 15 (02/22 0400) BP: (115-173)/(70-94) 115/94 mmHg (02/22 0400) SpO2:  [94 %-99 %] 95 % (02/22 0400) Last BM Date: 11/23/13  Intake/Output from previous day: 02/21 0701 - 02/22 0700 In: 2740 [P.O.:240; I.V.:2350; IV Piggyback:150] Out: 3675 [Urine:2575; Emesis/NG output:1100]  Exam: HEENT - clear, not icteric Neck - soft Chest - clear bilaterally Cor - RRR, no murmur Abd - soft, mild distension; rare BS present; dressing dry and intact Ext - no significant edema Neuro - grossly intact, no focal deficits  Lab Results:   Recent Labs  11/30/13 0330 12/01/13 0342  WBC 16.8* 15.8*  HGB 11.2* 11.7*  HCT 35.7* 36.7  PLT 179 200     Recent Labs  11/30/13 0330 12/01/13 0342  NA 138 136*  K 4.8 4.2  CL 104 102  CO2 22 23  GLUCOSE 75 122*  BUN 33* 19  CREATININE 0.91 0.72  CALCIUM 8.2* 7.9*    Studies/Results: No results found.  Assessment / Plan: DIAGNOSTIC LAPAROSCOPY, OPEN RESECTION OF SMALL BOWEL - 11/27/2013 - Dr. Ovidio Kinavid Newman - for closed loop small bowel obstruction with perforation   Zosyn - started 2/18   Continue NG tube until bowel function improves and output less   Open wound dressing changes as ordered  DVT prophylaxis - Lovenox  Dementia / Short term memory loss  History of stroke - history of right frontal stroke with left-sided hemiparesis resulting in gait instability  History of left hip fx - 05/23/2013 - J. Landau  HTN  Severe scoliosis    Velora Hecklerodd M. Lashuna Tamashiro, MD, [facility] Office:  [phone]  12/01/2013

## 2013-12-01 NOTE — Progress Notes (Signed)
Subjective: She feels OK with no abdominal pain or dyspnea, remains confused.  Objective: Vital signs in last 24 hours: Temp:  [97.5 F (36.4 C)-98.3 F (36.8 C)] 97.9 F (36.6 C) (02/22 0400) Pulse Rate:  [71-81] 76 (02/22 0400) Resp:  [13-19] 15 (02/22 0400) BP: (115-173)/(70-94) 115/94 mmHg (02/22 0400) SpO2:  [94 %-99 %] 95 % (02/22 0400) Weight change:    Intake/Output from previous day: 02/21 0701 - 02/22 0700 In: 2740 [P.O.:240; I.V.:2350; IV Piggyback:150] Out: 3675 [Urine:2575; Emesis/NG output:1100]   General appearance: alert, cooperative and no distress Resp: clear to auscultation bilaterally Cardio: regular rate and rhythm GI: bowel sounds absent, no tenderness  Lab Results:  Recent Labs  11/30/13 0330 12/01/13 0342  WBC 16.8* 15.8*  HGB 11.2* 11.7*  HCT 35.7* 36.7  PLT 179 200   BMET  Recent Labs  11/30/13 0330 12/01/13 0342  NA 138 136*  K 4.8 4.2  CL 104 102  CO2 22 23  GLUCOSE 75 122*  BUN 33* 19  CREATININE 0.91 0.72  CALCIUM 8.2* 7.9*   CMET CMP     Component Value Date/Time   NA 136* 12/01/2013 0342   K 4.2 12/01/2013 0342   CL 102 12/01/2013 0342   CO2 23 12/01/2013 0342   GLUCOSE 122* 12/01/2013 0342   BUN 19 12/01/2013 0342   CREATININE 0.72 12/01/2013 0342   CALCIUM 7.9* 12/01/2013 0342   PROT 6.7 11/25/2013 0455   ALBUMIN 3.3* 11/25/2013 0455   AST 24 11/25/2013 0455   ALT 12 11/25/2013 0455   ALKPHOS 60 11/25/2013 0455   BILITOT 0.8 11/25/2013 0455   GFRNONAA 74* 12/01/2013 0342   GFRAA 86* 12/01/2013 0342    CBG (last 3)  No results found for this basename: GLUCAP,  in the last 72 hours  INR RESULTS:   Lab Results  Component Value Date   INR 1.10 02/25/2011     Studies/Results: No results found.  Medications: I have reviewed the patient's current medications.  Assessment/Plan: #1 Alzheimers disease: stable with no worrisome behaviors #2 Protein calorie malnutrition: severe, and defer to general surgeon as to whether  to initiate shortterm TPN.   LOS: 7 days   Haldon Carley G 12/01/2013, 7:15 AM

## 2013-12-02 LAB — CULTURE, BLOOD (ROUTINE X 2)
Culture: NO GROWTH
Culture: NO GROWTH

## 2013-12-02 MED ORDER — ENSURE PUDDING PO PUDG
1.0000 | Freq: Three times a day (TID) | ORAL | Status: DC
  Administered 2013-12-02: 1 via ORAL
  Filled 2013-12-02 (×3): qty 1

## 2013-12-02 NOTE — Progress Notes (Signed)
INITIAL NUTRITION ASSESSMENT  DOCUMENTATION CODES Per approved criteria  -Not Applicable   INTERVENTION: - Diet advancement per MD - If diet unable to be advanced in the next 24-48 hours,  recommend TPN initiation as pt on day 8 of being NPO and still with NGT output - Will continue to monitor   NUTRITION DIAGNOSIS: Inadequate oral intake related to inability to eat as evidenced by NPO.   Goal: Advance diet as tolerated to regular diet  Monitor:  Weights, labs, diet advancement  Reason for Assessment: Malnutrition screening tool   80 y.o. female  Admitting Dx: Nausea and vomiting in adult patient  ASSESSMENT: Pt lives at home with close supervision by son and daughter-in-law, with history of short-term memory loss consistent with dementia, chronic and intermittent dizziness with no evidence of orthostasis, osteoporosis with history of left hip fracture last year, chronic kidney disease stage III, mild anemia, hypertension, and history of right frontal stroke with left-sided hemiparesis resulting in gait instability dating back to 2012, she is also status post laparoscopic cholecystectomy in 2010. However per son's report, the patient on February 13 as well as February 14 has had recurrent nausea with her breakfast, decreased oral intake, anorexic with dinner, not taking in very much at all. Son reports pt with increased confusion over the past 6 months. Found to have small bowel obstruction and had small bowel resection on 2/18.   Pt asleep and alone in room with NGT in place. Had 600ml output last night, 40ml documented so far today bile like in appearance per nursing documentation. Pt on day 8 of clear liquid diet/NPO.    Height: Ht Readings from Last 1 Encounters:  11/27/13 5\' 5"  (1.651 m)    Weight: Wt Readings from Last 1 Encounters:  11/29/13 135 lb (61.236 kg)    Ideal Body Weight: 125 lb   % Ideal Body Weight: 108%  Wt Readings from Last 10 Encounters:  11/29/13  135 lb (61.236 kg)  11/29/13 135 lb (61.236 kg)  05/25/13 122 lb (55.339 kg)  05/25/13 122 lb (55.339 kg)    Usual Body Weight: 122 lb in August 2014  % Usual Body Weight: 111%  BMI:  Body mass index is 22.47 kg/(m^2).  Estimated Nutritional Needs: Kcal: 1700-1900 Protein: 75-90g Fluid: 1.7-1.9L/day  Skin: Abdominal incision   Diet Order: NPO  EDUCATION NEEDS: -No education needs identified at this time   Intake/Output Summary (Last 24 hours) at 12/02/13 1510 Last data filed at 12/02/13 1400  Gross per 24 hour  Intake   1850 ml  Output   4415 ml  Net  -2565 ml    Last BM: 2/14   Labs:   Recent Labs Lab 11/29/13 0330 11/30/13 0330 12/01/13 0342  NA 139 138 136*  K 4.3 4.8 4.2  CL 106 104 102  CO2 22 22 23   BUN 44* 33* 19  CREATININE 1.00 0.91 0.72  CALCIUM 7.8* 8.2* 7.9*  GLUCOSE 112* 75 122*    CBG (last 3)  No results found for this basename: GLUCAP,  in the last 72 hours  Scheduled Meds: . antiseptic oral rinse  15 mL Mouth Rinse q12n4p  . chlorhexidine  15 mL Mouth Rinse BID  . enoxaparin (LOVENOX) injection  40 mg Subcutaneous Q24H  . pantoprazole (PROTONIX) IV  40 mg Intravenous Q24H  . piperacillin-tazobactam  3.375 g Intravenous Q8H    Continuous Infusions: . dextrose 5 % and 0.45 % NaCl with KCl 20 mEq/L 100 mL/hr at 12/02/13  4098    Past Medical History  Diagnosis Date  . Stroke   . GERD (gastroesophageal reflux disease)   . Arthritis     hands    Past Surgical History  Procedure Laterality Date  . Cholecystectomy    . Ectopic pregnancy surgery    . Hip arthroplasty Left 05/24/2013    Procedure: ARTHROPLASTY UNIPOLAR HIP;  Surgeon: Eulas Post, MD;  Location: [facility] OR;  Service: Orthopedics;  Laterality: Left;  . Colon resection N/A 11/27/2013    Procedure: DIAGNOSTIC LAPAROSCOPY OPEN RESECTION OF SMALL BOWEL;  Surgeon: Kandis Cocking, MD;  Location: [facility] ORS;  Service: General;  Laterality: N/A;    Levon Hedger MS, RD,  LDN [phone] Pager [phone] After Hours Pager

## 2013-12-02 NOTE — Progress Notes (Signed)
Patient ID: Lindsey CarnesRuth S Delamater, female   DOB: Apr 12, 1922, 80 y.o.   MRN: [id number]  General [facility] - [facility] - Progress Note  POD# 5  Subjective: Patient getting up to bedside commode - no BM's or flatus yet.  NG tube in place with 600 out last night.  Objective: Vital signs in last 24 hours: Temp:  [97.5 F (36.4 C)-98.7 F (37.1 C)] 98 F (36.7 C) (02/23 0400) Pulse Rate:  [72-80] 74 (02/23 0000) Resp:  [13-22] 13 (02/23 0400) BP: (108-164)/(55-88) 108/55 mmHg (02/23 0400) SpO2:  [73 %-100 %] 96 % (02/23 0000) Last BM Date: 11/23/13  Intake/Output from previous day: 02/22 0701 - 02/23 0700 In: 2350 [I.V.:2200; IV Piggyback:150] Out: 3975 [Urine:3375; Emesis/NG output:600]  Exam: Gen: NAD Chest - clear bilaterally Cor - RRR, no murmur Abd - soft, mild distension; dressing dry and intact Ext - no significant edema Neuro - grossly intact, no focal deficits  Lab Results:   Recent Labs  11/30/13 0330 12/01/13 0342  WBC 16.8* 15.8*  HGB 11.2* 11.7*  HCT 35.7* 36.7  PLT 179 200     Recent Labs  11/30/13 0330 12/01/13 0342  NA 138 136*  K 4.8 4.2  CL 104 102  CO2 22 23  GLUCOSE 75 122*  BUN 33* 19  CREATININE 0.91 0.72  CALCIUM 8.2* 7.9*    Studies/Results: No results found.  Assessment / Plan: DIAGNOSTIC LAPAROSCOPY, OPEN RESECTION OF SMALL BOWEL - 11/27/2013 - Dr. Ovidio Kinavid Newman - for closed loop small bowel obstruction with perforation   Zosyn - started 2/18   Continue NG tube until bowel function improves and output less   Open wound dressing changes as ordered  DVT prophylaxis - Lovenox  Dementia / Short term memory loss  History of stroke - history of right frontal stroke with left-sided hemiparesis resulting in gait instability  History of left hip fx - 05/23/2013 - J. Landau  HTN  Severe scoliosis    Vanita PandaAlicia C Dequann Vandervelden, MD  Colorectal and General Surgery [facility]   12/02/2013

## 2013-12-02 NOTE — Progress Notes (Signed)
Physical Therapy Treatment Patient Details Name: Lindsey CarnesRuth S Hensley MRN: [id number] DOB: 1922/05/13 Today's Date: 12/02/2013 Time: [id number] PT Time Calculation (min): 25 min  PT Assessment / Plan / Recommendation  History of Present Illness 80 y.o. female with PMH of CVA with L HP, L hip fx s/p hemiarthroplasy 6 months ago, admitted from home where she walked independently with a RW with nausea and vomiting. Now s/p exploratory laparotomy yesterday revealing bowel perforation    PT Comments   Pt feeling better.  Assisted OOB to amb in hallway then to BR to void.  Pt tolerated increased amb distance.  Son assisted by following with recliner but pt ended up mab around nursing station back to her her room not needing chair.     Follow Up Recommendations  SNF     Does the patient have the potential to tolerate intense rehabilitation     Barriers to Discharge        Equipment Recommendations  None recommended by PT    Recommendations for Other Services    Frequency Min 3X/week   Progress towards PT Goals Progress towards PT goals: Progressing toward goals  Plan      Precautions / Restrictions Precautions Precautions: Fall Precaution Comments: NG tube, monitor sats, s/p CVA L hemi Restrictions Weight Bearing Restrictions: No   Pertinent Vitals/Pain     Mobility  Bed Mobility Overal bed mobility: Needs Assistance Bed Mobility: Supine to Sit Supine to sit: Min assist General bed mobility comments: HOB elevated 45 degrees and increased time to self rise and scoot to EOB due to ABD discomfort  Transfers Overall transfer level: Needs assistance Equipment used: Rolling walker (2 wheeled) Transfers: Sit to/from Stand Sit to Stand: Mod assist General transfer comment: 25% VC's on proper tech and hand placem,ent esp stand to sit to control decend as pt fatigues quickly.   Ambulation/Gait Ambulation/Gait assistance: +2 physical assistance;+2 safety/equipment;Min assist Ambulation  Distance (Feet): 185 Feet Assistive device: Rolling walker (2 wheeled) Gait Pattern/deviations: Step-through pattern Gait velocity: decreased General Gait Details: son asssited by following with recliner.  Pt was able to amb around nursing unit with no sitting rest breaks.    PT Goals (current goals can now be found in the care plan section)    Visit Information  Last PT Received On: 12/02/13 History of Present Illness: 80 y.o. female with PMH of CVA with L HP, L hip fx s/p hemiarthroplasy 6 months ago, admitted from home where she walked independently with a RW with nausea and vomiting. Now s/p exploratory laparotomy yesterday revealing bowel perforation     Subjective Data      Cognition       Balance     End of Session PT - End of Session Equipment Utilized During Treatment: Gait belt Activity Tolerance: Patient limited by fatigue Patient left: in chair;with call bell/phone within reach;with family/visitor present   Lindsey ShellingLori Ivori Hensley  PTA [facility]  Acute  Rehab Pager      [phone]

## 2013-12-02 NOTE — Progress Notes (Signed)
Subjective: She now recalls having surgery.  Denies abdominal pain.  NG remains in place.  No flatus.  "I just want to go home"  Objective: Vital signs in last 24 hours: Temp:  [97.5 F (36.4 C)-98.7 F (37.1 C)] 98 F (36.7 C) (02/23 0400) Pulse Rate:  [72-80] 74 (02/23 0000) Resp:  [13-22] 13 (02/23 0400) BP: (93-164)/(49-88) 108/55 mmHg (02/23 0400) SpO2:  [73 %-100 %] 96 % (02/23 0000) Weight change:  Last BM Date: 11/23/13  CBG (last 3)  No results found for this basename: GLUCAP,  in the last 72 hours  Intake/Output from previous day: 02/22 0701 - 02/23 0700 In: 2250 [I.V.:2100; IV Piggyback:150] Out: 3975 [Urine:3375; Emesis/NG output:600] Intake/Output this shift:    General appearance: alert and no distress Eyes: no scleral icterus Throat: oropharynx moist without erythema Resp: clear to auscultation bilaterally Cardio: regular rate and rhythm GI: soft, non-tender; minimal bowel sounds present; no rebound or guarding Extremities: no clubbing, cyanosis or edema, SCDs in place.   Lab Results:  Recent Labs  11/30/13 0330 12/01/13 0342  NA 138 136*  K 4.8 4.2  CL 104 102  CO2 22 23  GLUCOSE 75 122*  BUN 33* 19  CREATININE 0.91 0.72  CALCIUM 8.2* 7.9*   No results found for this basename: AST, ALT, ALKPHOS, BILITOT, PROT, ALBUMIN,  in the last 72 hours  Recent Labs  11/30/13 0330 12/01/13 0342  WBC 16.8* 15.8*  HGB 11.2* 11.7*  HCT 35.7* 36.7  MCV 93.2 90.8  PLT 179 200   Lab Results  Component Value Date   INR 1.10 02/25/2011   No results found for this basename: CKTOTAL, CKMB, CKMBINDEX, TROPONINI,  in the last 72 hours No results found for this basename: TSH, T4TOTAL, FREET3, T3FREE, THYROIDAB,  in the last 72 hours No results found for this basename: VITAMINB12, FOLATE, FERRITIN, TIBC, IRON, RETICCTPCT,  in the last 72 hours  Studies/Results: No results found.   Medications: Scheduled: . antiseptic oral rinse  15 mL Mouth Rinse q12n4p   . chlorhexidine  15 mL Mouth Rinse BID  . enoxaparin (LOVENOX) injection  40 mg Subcutaneous Q24H  . pantoprazole (PROTONIX) IV  40 mg Intravenous Q24H  . piperacillin-tazobactam  3.375 g Intravenous Q8H   Continuous: . dextrose 5 % and 0.45 % NaCl with KCl 20 mEq/L 100 mL/hr at 12/02/13 0152    Assessment/Plan: Principal Problem:  1. Small bowel obstruction with perforation- doing well after Ex Lap with bowel repair (POD #5). Post-op management including NG management, diet, disposition per General Surgery.  Active Problems:  2. Leukocytosis-secondary to bowel perforation. Continue Unasyn. Slowly improving.  Will continue Zosyn for 7 days post-op unless surgery feels that longer duration is needed. 3. Severe protein calorie malnutrition- NPO for ~1 week.  Consider TNA if unable to start feedings soon.  4. Acute on Chronic Renal Failure- improved post-op. Continue hydration until po intake improves.  5. 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 No behavioral issues.  6. History of CVA (cerebrovascular accident) with left hemiparesis-resume ASA when OK with GSU.  7. Disposition- per general surgery. Resume PT/OT. Transfer to floor today. May need SNF rehab after discharge though patient is adamant about going home.  Anticipate discharge later this week if able to start pos.   Primary management directed by General Surgery as she has no other active medical issues currently. We will continue to follow.    LOS: 8 days   Lindsey Hensley, [name] 12/02/2013, 7:28  AM

## 2013-12-03 ENCOUNTER — Inpatient Hospital Stay (HOSPITAL_COMMUNITY): Payer: Medicare HMO

## 2013-12-03 LAB — COMPREHENSIVE METABOLIC PANEL
ALT: 17 U/L (ref 0–35)
AST: 28 U/L (ref 0–37)
Albumin: 2.5 g/dL — ABNORMAL LOW (ref 3.5–5.2)
Alkaline Phosphatase: 50 U/L (ref 39–117)
BUN: 5 mg/dL — AB (ref 6–23)
CO2: 24 meq/L (ref 19–32)
Calcium: 8.6 mg/dL (ref 8.4–10.5)
Chloride: 102 mEq/L (ref 96–112)
Creatinine, Ser: 0.82 mg/dL (ref 0.50–1.10)
GFR calc Af Amer: 71 mL/min — ABNORMAL LOW (ref >90)
GFR, EST NON AFRICAN AMERICAN: 62 mL/min — AB (ref >90)
Glucose, Bld: 100 mg/dL — ABNORMAL HIGH (ref 70–99)
POTASSIUM: 4.3 meq/L (ref 3.7–5.3)
SODIUM: 139 meq/L (ref 137–147)
Total Bilirubin: 0.4 mg/dL (ref 0.3–1.2)
Total Protein: 6.4 g/dL (ref 6.0–8.3)

## 2013-12-03 LAB — CBC
HCT: 36.9 % (ref 36.0–46.0)
HEMOGLOBIN: 11.8 g/dL — AB (ref 12.0–15.0)
MCH: 29.1 pg (ref 26.0–34.0)
MCHC: 32 g/dL (ref 30.0–36.0)
MCV: 91.1 fL (ref 78.0–100.0)
Platelets: 204 10*3/uL (ref 150–400)
RBC: 4.05 MIL/uL (ref 3.87–5.11)
RDW: 14.3 % (ref 11.5–15.5)
WBC: 13.1 10*3/uL — ABNORMAL HIGH (ref 4.0–10.5)

## 2013-12-03 LAB — GLUCOSE, CAPILLARY
GLUCOSE-CAPILLARY: 109 mg/dL — AB (ref 70–99)
GLUCOSE-CAPILLARY: 136 mg/dL — AB (ref 70–99)

## 2013-12-03 MED ORDER — BISACODYL 10 MG RE SUPP
10.0000 mg | Freq: Once | RECTAL | Status: AC
  Administered 2013-12-03: 10 mg via RECTAL
  Filled 2013-12-03: qty 1

## 2013-12-03 MED ORDER — M.V.I. ADULT IV INJ
INTRAVENOUS | Status: AC
  Administered 2013-12-03: 18:00:00 via INTRAVENOUS
  Filled 2013-12-03: qty 1000

## 2013-12-03 MED ORDER — SODIUM CHLORIDE 0.9 % IJ SOLN
10.0000 mL | INTRAMUSCULAR | Status: DC | PRN
  Administered 2013-12-05 – 2013-12-17 (×18): 10 mL

## 2013-12-03 MED ORDER — KCL IN DEXTROSE-NACL 20-5-0.45 MEQ/L-%-% IV SOLN
INTRAVENOUS | Status: AC
  Administered 2013-12-04: 60 mL via INTRAVENOUS
  Filled 2013-12-03 (×2): qty 1000

## 2013-12-03 MED ORDER — KCL IN DEXTROSE-NACL 20-5-0.45 MEQ/L-%-% IV SOLN
INTRAVENOUS | Status: AC
  Administered 2013-12-03: 100 mL/h via INTRAVENOUS
  Filled 2013-12-03 (×2): qty 1000

## 2013-12-03 MED ORDER — FAT EMULSION 20 % IV EMUL
250.0000 mL | INTRAVENOUS | Status: AC
  Administered 2013-12-03: 250 mL via INTRAVENOUS
  Filled 2013-12-03: qty 250

## 2013-12-03 MED ORDER — ASPIRIN 325 MG PO TABS
325.0000 mg | ORAL_TABLET | Freq: Every day | ORAL | Status: DC
  Administered 2013-12-03 – 2013-12-17 (×15): 325 mg via ORAL
  Filled 2013-12-03 (×15): qty 1

## 2013-12-03 MED ORDER — INSULIN ASPART 100 UNIT/ML ~~LOC~~ SOLN
0.0000 [IU] | SUBCUTANEOUS | Status: DC
  Administered 2013-12-03 – 2013-12-05 (×5): 1 [IU] via SUBCUTANEOUS

## 2013-12-03 NOTE — Progress Notes (Signed)
PARENTERAL NUTRITION CONSULT NOTE - INITIAL  Pharmacy Consult for TNA Indication: SBO  Allergies  Allergen Reactions  . Sulfa Antibiotics Other (See Comments)    unknown    Patient Measurements: Height: 5\' 5"  (165.1 cm) Weight: 131 lb 12.8 oz (59.784 kg) IBW/kg (Calculated) : 57 Usual Weight: 122 lb (55.5kg)  Vital Signs: Temp: 97.7 F (36.5 C) (02/24 0600) Temp src: Oral (02/24 0600) BP: 148/81 mmHg (02/24 0600) Pulse Rate: 85 (02/24 0600) Intake/Output from previous day: 02/23 0701 - 02/24 0700 In: 2600 [P.O.:120; I.V.:2300; NG/GT:30; IV Piggyback:150] Out: 1940 [Urine:1550; Emesis/NG output:390] Intake/Output from this shift:    Labs:  Recent Labs  12/01/13 0342  WBC 15.8*  HGB 11.7*  HCT 36.7  PLT 200     Recent Labs  12/01/13 0342  NA 136*  K 4.2  CL 102  CO2 23  GLUCOSE 122*  BUN 19  CREATININE 0.72  CALCIUM 7.9*   Estimated Creatinine Clearance: 42.9 ml/min (by C-G formula based on Cr of 0.72).   No results found for this basename: GLUCAP,  in the last 72 hours  Medical History: Past Medical History  Diagnosis Date  . Stroke   . GERD (gastroesophageal reflux disease)   . Arthritis     hands   Insulin Requirements in the past 24 hours:  0 units insulin, no hx DM  Current Nutrition:  NPO  Assessment: [age] yoF with history of stroke resulting in left-sided hemiparesis and gait instability, hx dementia, severe scoliosis, CKD-III, presents with anorexia and N/V on 2/15, found to have SBO with perforation.  S/p laparotomy with small bowel resection 2/18. Delayed recovery of bowel function and pharmacy consulted to begin TNA.  D9 NPO/clear liquid status.  PICC ordered to be placed today.    Nutritional Goals:  1700-1900 Kcal/day, 75-90 grams protein/day, 1.7-1.9 L fluid/day Clinimix E5/20 at 8865ml/hr with Lipids at 5810ml/hr will provide 1853 Kcal and 78g protein per day  Lines:  NGT PIV (R forearm)  Fluids: D5/1/2NS/K2420mEq @ 100  ml/hr  Labs: Renal: SCr 0.72, CrCl 43, UOP 2.3 ml/kg/hr, Hx CKD-III Electrolytes: Na slightly low at 136, Corr Ca 8.4 LFTs: WNL on 2/17 Prealbumin: Will order for 2/26 Triglycerides: Will order for 2/26 CBGs: WNL 2/22  If PICC is placed today, at 1800 today:  Start Clinimix E5/20 at 6440ml/hr.  Fat emulsion at 5410ml/hr  Plan to advance as tolerated to the goal rate.  Reduce IVF to 4360ml/hr.  Add SSI with CBG checks q 4 h  TNA lab panels on Mondays & Thursdays.  F/u daily.   Haynes Hoehnolleen Lorren Rossetti, PharmD, BCPS 12/03/2013, 8:15 AM  Pager: [phone]

## 2013-12-03 NOTE — Progress Notes (Signed)
Occupational Therapy Treatment Patient Details Name: Lindsey CarnesRuth S Eberlin MRN: [id number] DOB: 15-Oct-1921 Today's Date: 12/03/2013 Time: [id number] OT Time Calculation (min): 19 min  OT Assessment / Plan / Recommendation  History of present illness pt is s/p exploratory lap   she has a h/o CVA, dementia, L THA 6 months ago   OT comments  Pt did not c/o dizziness this session  S/p exploratory lap    Follow Up Recommendations  Supervision/Assistance - 24 hour (family does not want snf)    Barriers to Discharge       Equipment Recommendations  3 in 1 bedside comode    Recommendations for Other Services    Frequency Min 2X/week   Progress towards OT Goals Progress towards OT goals: Progressing toward goals (slowly  now s/p expl lap)  Plan      Precautions / Restrictions Precautions Precautions: Fall Precaution Comments: NG tube, monitor sats, s/p CVA L hemi Restrictions Weight Bearing Restrictions: No   Pertinent Vitals/Pain No c/o pain  Rolled to minimize stress    ADL  Toilet Transfer: Minimal assistance Toilet Transfer Method: Stand pivot Toilet Transfer Equipment: Bedside commode Toileting - Clothing Manipulation and Hygiene: Maximal assistance Where Assessed - Engineer, miningToileting Clothing Manipulation and Hygiene: Sit to stand from 3-in-1 or toilet Transfers/Ambulation Related to ADLs: cues for bed mobility for sidelying ADL Comments: no signs or c/o dizziness today    OT Diagnosis:    OT Problem List:   OT Treatment Interventions:     OT Goals(current goals can now be found in the care plan section)    Visit Information  Last OT Received On: 12/03/13 Assistance Needed: +1 History of Present Illness: pt is s/p exploratory lap   she has a h/o CVA, dementia, L THA 6 months ago    Subjective Data      Prior Functioning       Cognition  Cognition Arousal/Alertness: Awake/alert Behavior During Therapy: WFL for tasks assessed/performed Overall Cognitive Status: History of  cognitive impairments - at baseline    Mobility  Bed Mobility General bed mobility comments: min A sidelying to sit Transfers Sit to Stand: Min assist General transfer comment: multimodal cues for safety    Exercises      Balance    End of Session OT - End of Session Activity Tolerance: Patient tolerated treatment well Patient left: in bed;with call bell/phone within reach;with bed alarm set  GO     Clemons Salvucci 12/03/2013, 2:58 PM Marica OtterMaryellen Mung Rinker, OTR/L [phone] 12/03/2013

## 2013-12-03 NOTE — Progress Notes (Signed)
Peripherally Inserted Central Catheter/Midline Placement  The IV Nurse has discussed with the patient and/or persons authorized to consent for the patient, the purpose of this procedure and the potential benefits and risks involved with this procedure.  The benefits include less needle sticks, lab draws from the catheter and patient may be discharged home with the catheter.  Risks include, but not limited to, infection, bleeding, blood clot (thrombus formation), and puncture of an artery; nerve damage and irregular heat beat.  Alternatives to this procedure were also discussed.  PICC/Midline Placement Documentation        Timmothy Soursewman, Chai Routh Renee 12/03/2013, 12:22 PM

## 2013-12-03 NOTE — Progress Notes (Signed)
6 Days Post-Op  Subjective: Pt still with no BM or flatus.   KUB findings noted. NGT 390  Objective: Vital signs in last 24 hours: Temp:  [97.7 F (36.5 C)-97.9 F (36.6 C)] 97.7 F (36.5 C) (02/24 0600) Pulse Rate:  [75-94] 85 (02/24 0600) Resp:  [14-18] 16 (02/24 0600) BP: (137-149)/(81-85) 148/81 mmHg (02/24 0600) SpO2:  [92 %-95 %] 94 % (02/24 0600) Weight:  [131 lb 12.8 oz (59.784 kg)] 131 lb 12.8 oz (59.784 kg) (02/24 0454) Last BM Date: 11/23/13  Intake/Output from previous day: 02/23 0701 - 02/24 0700 In: 2600 [P.O.:120; I.V.:2300; NG/GT:30; IV Piggyback:150] Out: 1940 [Urine:1550; Emesis/NG output:390] Intake/Output this shift: Total I/O In: 0  Out: 300 [Urine:300]  General appearance: alert and cooperative GI: soft, NTTP, ND, hypoactice BS  Lab Results:   Recent Labs  12/01/13 0342 12/03/13 0815  WBC 15.8* 13.1*  HGB 11.7* 11.8*  HCT 36.7 36.9  PLT 200 204   BMET  Recent Labs  12/01/13 0342 12/03/13 0815  NA 136* 139  K 4.2 4.3  CL 102 102  CO2 23 24  GLUCOSE 122* 100*  BUN 19 5*  CREATININE 0.72 0.82  CALCIUM 7.9* 8.6   PT/INR No results found for this basename: LABPROT, INR,  in the last 72 hours ABG No results found for this basename: PHART, PCO2, PO2, HCO3,  in the last 72 hours  Studies/Results: Dg Abd 2 Views  12/03/2013   CLINICAL DATA:  Small bowel obstruction  EXAM: ABDOMEN - 2 VIEW  COMPARISON:  11/27/2013  FINDINGS: NG tube in place is noted. Mild distended small bowel loops mid abdomen and left upper abdomen with air-fluid levels consistent with small bowel obstruction. Left hip prosthesis again noted. Degenerative changes lumbar spine. Levoscoliosis of lumbar spine again noted. Postcholecystectomy surgical clips.  IMPRESSION: NG tube in place. Mild distended small bowel loops with air-fluid levels consistent with small bowel obstruction.   Electronically Signed   By: Natasha MeadLiviu  Pop M.D.   On: 12/03/2013 09:04     Anti-infectives: Anti-infectives   Start     Dose/Rate Route Frequency Ordered Stop   11/28/13 0000  piperacillin-tazobactam (ZOSYN) IVPB 3.375 g     3.375 g 12.5 mL/hr over 240 Minutes Intravenous Every 8 hours 11/27/13 1623     11/27/13 1500  piperacillin-tazobactam (ZOSYN) IVPB 3.375 g     3.375 g 100 mL/hr over 30 Minutes Intravenous  Once 11/27/13 1443 11/27/13 1445   11/25/13 2359  [MAR Hold]  ceFEPIme (MAXIPIME) 1 g in dextrose 5 % 50 mL IVPB  Status:  Discontinued     (On MAR Hold since 11/27/13 1345)   1 g 100 mL/hr over 30 Minutes Intravenous Every 24 hours 11/25/13 2300 11/27/13 1633   11/24/13 1800  ampicillin-sulbactam (UNASYN) 1.5 g in sodium chloride 0.9 % 50 mL IVPB  Status:  Discontinued     1.5 g 100 mL/hr over 30 Minutes Intravenous Every 12 hours 11/24/13 1549 11/25/13 0956      Assessment/Plan: s/p Procedure(s): DIAGNOSTIC LAPAROSCOPY OPEN RESECTION OF SMALL BOWEL (N/A) Con't to mobilize OK for ASA as per IM Rectal supp today KUB shows stool in colon, likely to produce BM in 1-2 days   LOS: 9 days    Marigene Ehlersamirez Jr., Jed LimerickArmando 12/03/2013

## 2013-12-03 NOTE — Progress Notes (Signed)
Nutrition Brief Note  Pt scheduled to start TPN tonight. Full nutrition assessment completed yesterday. Met with pt who reports she was eating 2-3 meals/day PTA with stable weight of 120 pounds. States she likes to eat vegetables and protein. Pt seems confused and was unsure of how her appetite was PTA.   Mikey College MS, [place], [place] Pager [phone] After Hours Pager

## 2013-12-03 NOTE — Progress Notes (Signed)
Came to visit patient at bedside again to discuss [facility]. However, her son was not at bedside. Called Mr Marney SettingDonald Milberger at [phone]. States the plan is to take patient home and not SNF. States she will not agree to it. He is agreeable to [facility] services now. Discussed how [facility] could serve as an additional resource/support in addition to the [facility] that already calls. Explained that [facility] can also make monthly home visits. Left packet at bedside for him to sign consent when he comes back to visit. Will come back at later time to obtain consent. Right now verbal consent. Will make inpatient RNCM aware.  Raiford NobleAtika Hall, MSN- RN, BSN- [facility] Care [facility] Hospital [phone]- [phone]

## 2013-12-03 NOTE — Progress Notes (Signed)
CSW met with pt / son to assist with d/c planning. Pt is declining SNF placement at this time. Son states that pt was in [facility] for one day and pt refused to stay any longer. " I couldn't convince her to stay. I had to bring her back home. "  Pt does not have 24/7 support at home , though son says pt is alone for only a minimal amount of time. Safety concerns reviewed with pt / son. Pt is willing to accept [facility] has provided a list of pvt pay services to son. RNCM is aware that pt has refused SNF. CSW is available to assist with SNF if pt changes her mind.  Werner Lean LCSW [phone]

## 2013-12-03 NOTE — Progress Notes (Signed)
Subjective: Complains of mild abdominal soreness.  Bilious NG output remains copious.  No flatus (that she can remember).  Objective: Vital signs in last 24 hours: Temp:  [97.7 F (36.5 C)-97.9 F (36.6 C)] 97.7 F (36.5 C) (02/24 0600) Pulse Rate:  [75-94] 85 (02/24 0600) Resp:  [14-18] 16 (02/24 0600) BP: (137-149)/(60-85) 148/81 mmHg (02/24 0600) SpO2:  [92 %-95 %] 94 % (02/24 0600) Weight:  [59.784 kg (131 lb 12.8 oz)] 59.784 kg (131 lb 12.8 oz) (02/24 0454) Weight change:  Last BM Date: 11/23/13  CBG (last 3)  No results found for this basename: GLUCAP,  in the last 72 hours  Intake/Output from previous day: 02/23 0701 - 02/24 0700 In: 2600 [P.O.:120; I.V.:2300; NG/GT:30; IV Piggyback:150] Out: 1940 [Urine:1550; Emesis/NG output:390] Intake/Output this shift:    General appearance: alert and no distress Eyes: no scleral icterus Throat: oropharynx moist without erythema Resp: clear to auscultation bilaterally Cardio: regular rate and rhythm GI: soft, non-tender; bowel sounds normal; no masses,  no organomegaly Extremities: no clubbing, cyanosis; trace bilateral lower extremity edema   Lab Results:  Recent Labs  12/01/13 0342  NA 136*  K 4.2  CL 102  CO2 23  GLUCOSE 122*  BUN 19  CREATININE 0.72  CALCIUM 7.9*   No results found for this basename: AST, ALT, ALKPHOS, BILITOT, PROT, ALBUMIN,  in the last 72 hours  Recent Labs  12/01/13 0342  WBC 15.8*  HGB 11.7*  HCT 36.7  MCV 90.8  PLT 200   Lab Results  Component Value Date   INR 1.10 02/25/2011   No results found for this basename: CKTOTAL, CKMB, CKMBINDEX, TROPONINI,  in the last 72 hours No results found for this basename: TSH, T4TOTAL, FREET3, T3FREE, THYROIDAB,  in the last 72 hours No results found for this basename: VITAMINB12, FOLATE, FERRITIN, TIBC, IRON, RETICCTPCT,  in the last 72 hours  Studies/Results: No results found.   Medications: Scheduled: . antiseptic oral rinse  15 mL  Mouth Rinse q12n4p  . chlorhexidine  15 mL Mouth Rinse BID  . enoxaparin (LOVENOX) injection  40 mg Subcutaneous Q24H  . pantoprazole (PROTONIX) IV  40 mg Intravenous Q24H  . piperacillin-tazobactam  3.375 g Intravenous Q8H   Continuous: . dextrose 5 % and 0.45 % NaCl with KCl 20 mEq/L 100 mL/hr at 12/02/13 2128    Assessment/Plan: Principal Problem:  1. Small bowel obstruction with perforation- delayed recovery of bowel function after Ex Lap with bowel repair (POD #6). Post-op management including NG management, diet, disposition per General Surgery.  Will start TNA due to prolonged NPO status.  Obtain abdominal X-ray today. Active Problems:  2. Leukocytosis-secondary to bowel perforation. Continue Zosyn for 7 days post-op unless surgery feels that longer duration is needed.  Recheck CBC today. 3. Severe protein calorie malnutrition- NPO for 1 week. Will place PICC line and start TNA.  4. Acute on Chronic Renal Failure- improved post-op. Continue hydration until po intake improves.  Check CMET today to monitor electrolytes. 5. 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 No behavioral issues.  6. History of CVA (cerebrovascular accident) with left hemiparesis-?? Is it OK to resume Aspirin per General Surgery? 7. Disposition- per general surgery. Continue PT/OT.  May need SNF rehab after discharge though patient is adamant about going home. Anticipate discharge later this week if able to start pos.     LOS: 9 days   Martha ClanShaw, Jadine Brumley 12/03/2013, 7:35 AM

## 2013-12-04 LAB — GLUCOSE, CAPILLARY
GLUCOSE-CAPILLARY: 120 mg/dL — AB (ref 70–99)
GLUCOSE-CAPILLARY: 123 mg/dL — AB (ref 70–99)
Glucose-Capillary: 127 mg/dL — ABNORMAL HIGH (ref 70–99)
Glucose-Capillary: 90 mg/dL (ref 70–99)
Glucose-Capillary: 117 mg/dL — ABNORMAL HIGH (ref 70–99)
Glucose-Capillary: 134 mg/dL — ABNORMAL HIGH (ref 70–99)

## 2013-12-04 LAB — PREALBUMIN: Prealbumin: 12.7 mg/dL — ABNORMAL LOW (ref 17.0–34.0)

## 2013-12-04 LAB — CBC
HCT: 33.4 % — ABNORMAL LOW (ref 36.0–46.0)
Hemoglobin: 10.6 g/dL — ABNORMAL LOW (ref 12.0–15.0)
MCH: 29 pg (ref 26.0–34.0)
MCHC: 31.7 g/dL (ref 30.0–36.0)
MCV: 91.5 fL (ref 78.0–100.0)
Platelets: 178 10*3/uL (ref 150–400)
RBC: 3.65 MIL/uL — ABNORMAL LOW (ref 3.87–5.11)
RDW: 14.3 % (ref 11.5–15.5)
WBC: 12.5 10*3/uL — AB (ref 4.0–10.5)

## 2013-12-04 LAB — COMPREHENSIVE METABOLIC PANEL
ALBUMIN: 2.2 g/dL — AB (ref 3.5–5.2)
ALK PHOS: 44 U/L (ref 39–117)
ALT: 16 U/L (ref 0–35)
AST: 25 U/L (ref 0–37)
BUN: 8 mg/dL (ref 6–23)
CALCIUM: 8.4 mg/dL (ref 8.4–10.5)
CO2: 24 mEq/L (ref 19–32)
Chloride: 104 mEq/L (ref 96–112)
Creatinine, Ser: 0.76 mg/dL (ref 0.50–1.10)
GFR calc non Af Amer: 73 mL/min — ABNORMAL LOW (ref >90)
GFR, EST AFRICAN AMERICAN: 84 mL/min — AB (ref >90)
GLUCOSE: 134 mg/dL — AB (ref 70–99)
POTASSIUM: 3.6 meq/L — AB (ref 3.7–5.3)
Sodium: 140 mEq/L (ref 137–147)
TOTAL PROTEIN: 5.5 g/dL — AB (ref 6.0–8.3)
Total Bilirubin: 0.4 mg/dL (ref 0.3–1.2)

## 2013-12-04 LAB — PHOSPHORUS: PHOSPHORUS: 2.8 mg/dL (ref 2.3–4.6)

## 2013-12-04 LAB — DIFFERENTIAL
BASOS ABS: 0 10*3/uL (ref 0.0–0.1)
Basophils Relative: 0 % (ref 0–1)
Eosinophils Absolute: 0.1 10*3/uL (ref 0.0–0.7)
Eosinophils Relative: 1 % (ref 0–5)
LYMPHS PCT: 11 % — AB (ref 12–46)
Lymphs Abs: 1.4 10*3/uL (ref 0.7–4.0)
MONOS PCT: 16 % — AB (ref 3–12)
Monocytes Absolute: 2 10*3/uL — ABNORMAL HIGH (ref 0.1–1.0)
NEUTROS PCT: 72 % (ref 43–77)
Neutro Abs: 9 10*3/uL — ABNORMAL HIGH (ref 1.7–7.7)

## 2013-12-04 LAB — TRIGLYCERIDES: TRIGLYCERIDES: 125 mg/dL (ref <150)

## 2013-12-04 LAB — MAGNESIUM: Magnesium: 1.7 mg/dL (ref 1.5–2.5)

## 2013-12-04 MED ORDER — FAT EMULSION 20 % IV EMUL
240.0000 mL | INTRAVENOUS | Status: AC
Start: 2013-12-04 — End: 2013-12-05
  Administered 2013-12-04: 240 mL via INTRAVENOUS
  Filled 2013-12-04: qty 250

## 2013-12-04 MED ORDER — POTASSIUM CHLORIDE 10 MEQ/100ML IV SOLN
10.0000 meq | INTRAVENOUS | Status: AC
  Administered 2013-12-04 (×4): 10 meq via INTRAVENOUS
  Filled 2013-12-04 (×4): qty 100

## 2013-12-04 MED ORDER — KCL IN DEXTROSE-NACL 20-5-0.45 MEQ/L-%-% IV SOLN
INTRAVENOUS | Status: DC
  Administered 2013-12-05 – 2013-12-10 (×5): via INTRAVENOUS
  Filled 2013-12-04 (×8): qty 1000

## 2013-12-04 MED ORDER — TRACE MINERALS CR-CU-F-FE-I-MN-MO-SE-ZN IV SOLN
INTRAVENOUS | Status: AC
  Administered 2013-12-04: 18:00:00 via INTRAVENOUS
  Filled 2013-12-04: qty 2000

## 2013-12-04 NOTE — Progress Notes (Signed)
Patient ID: Lindsey Hensley, female   DOB: 1922-03-01, 80 y.o.   MRN: [id number]  Subjective: White count is trending down.  On TPN.  BP up today.  Afebrile.  No flatus or BM yet.  945m out.  Objective:  Vital signs:  Filed Vitals:   12/03/13 0600 12/03/13 1333 12/03/13 2139 12/04/13 0543  BP: 148/81 133/74 163/84 173/80  Pulse: 85 67 90 82  Temp: 97.7 F (36.5 C) 97.2 F (36.2 C) 97.8 F (36.6 C) 97.3 F (36.3 C)  TempSrc: Oral Axillary Oral Oral  Resp: _0 Height:      Weight:      SpO2: 94% 95% 94% 96%    Last BM Date: 11/23/13  Intake/Output   Yesterday:  02/24 0701 - 02/25 0700 In: 430 [I.V.:400; NG/GT:30] Out: 3735 [Urine:2775; Emesis/NG output:960] This shift:    I/O last 3 completed shifts: In: 2680 [P.O.:120; I.V.:2400; NG/GT:60; IV Piggyback:100] Out: 3885 [Urine:2775; Emesis/NG output:1110]    Physical Exam: General: Pt awake/alert/oriented x3 in no acute distress Abdomen: Soft.  Mild distention  Mildly tender at incision only. NGT in place with bilious output.   Problem List:   Principal Problem:   Nausea and vomiting in adult patient Active Problems:   History of CVA (cerebrovascular accident)   Leukocytosis   Nausea and vomiting   Small bowel obstruction, partial   Dementia in Alzheimer's disease with delirium   Acute on chronic renal failure   Hemiparesis affecting left side as late effect of cerebrovascular accident    Results:   Labs: Results for orders placed during the hospital encounter of 11/24/13 (from the past 48 hour(s))  CBC     Status: Abnormal   Collection Time    12/03/13  8:15 AM      Result Value Ref Range   WBC 13.1 (*) 4.0 - 10.5 K/uL   RBC 4.05  3.87 - 5.11 MIL/uL   Hemoglobin 11.8 (*) 12.0 - 15.0 g/dL   HCT 36.9  36.0 - 46.0 %   MCV 91.1  78.0 - 100.0 fL   MCH 29.1  26.0 - 34.0 pg   MCHC 32.0  30.0 - 36.0 g/dL   RDW 14.3  11.5 - 15.5 %   Platelets 204  150 - 400 K/uL  COMPREHENSIVE METABOLIC PANEL      Status: Abnormal   Collection Time    12/03/13  8:15 AM      Result Value Ref Range   Sodium 139  137 - 147 mEq/L   Potassium 4.3  3.7 - 5.3 mEq/L   Chloride 102  96 - 112 mEq/L   CO2 24  19 - 32 mEq/L   Glucose, Bld 100 (*) 70 - 99 mg/dL   BUN 5 (*) 6 - 23 mg/dL   Creatinine, Ser 0.82  0.50 - 1.10 mg/dL   Calcium 8.6  8.4 - 10.5 mg/dL   Total Protein 6.4  6.0 - 8.3 g/dL   Albumin 2.5 (*) 3.5 - 5.2 g/dL   AST 28  0 - 37 U/L   ALT 17  0 - 35 U/L   Alkaline Phosphatase 50  39 - 117 U/L   Total Bilirubin 0.4  0.3 - 1.2 mg/dL   GFR calc non Af Amer 62 (*) >90 mL/min   GFR calc Af Amer 71 (*) >90 mL/min   Comment: (NOTE)     The eGFR has been calculated using the CKD EPI equation.  This calculation has not been validated in all clinical situations.     eGFR's persistently <90 mL/min signify possible Chronic Kidney     Disease.  GLUCOSE, CAPILLARY     Status: Abnormal   Collection Time    12/03/13  8:23 PM      Result Value Ref Range   Glucose-Capillary 109 (*) 70 - 99 mg/dL  GLUCOSE, CAPILLARY     Status: Abnormal   Collection Time    12/03/13 11:25 PM      Result Value Ref Range   Glucose-Capillary 136 (*) 70 - 99 mg/dL  GLUCOSE, CAPILLARY     Status: Abnormal   Collection Time    12/04/13  3:27 AM      Result Value Ref Range   Glucose-Capillary 120 (*) 70 - 99 mg/dL  COMPREHENSIVE METABOLIC PANEL     Status: Abnormal   Collection Time    12/04/13  4:18 AM      Result Value Ref Range   Sodium 140  137 - 147 mEq/L   Potassium 3.6 (*) 3.7 - 5.3 mEq/L   Chloride 104  96 - 112 mEq/L   CO2 24  19 - 32 mEq/L   Glucose, Bld 134 (*) 70 - 99 mg/dL   BUN 8  6 - 23 mg/dL   Creatinine, Ser 0.76  0.50 - 1.10 mg/dL   Calcium 8.4  8.4 - 10.5 mg/dL   Total Protein 5.5 (*) 6.0 - 8.3 g/dL   Albumin 2.2 (*) 3.5 - 5.2 g/dL   AST 25  0 - 37 U/L   ALT 16  0 - 35 U/L   Alkaline Phosphatase 44  39 - 117 U/L   Total Bilirubin 0.4  0.3 - 1.2 mg/dL   GFR calc non Af Amer 73 (*) >90  mL/min   GFR calc Af Amer 84 (*) >90 mL/min   Comment: (NOTE)     The eGFR has been calculated using the CKD EPI equation.     This calculation has not been validated in all clinical situations.     eGFR's persistently <90 mL/min signify possible Chronic Kidney     Disease.  MAGNESIUM     Status: None   Collection Time    12/04/13  4:18 AM      Result Value Ref Range   Magnesium 1.7  1.5 - 2.5 mg/dL  PHOSPHORUS     Status: None   Collection Time    12/04/13  4:18 AM      Result Value Ref Range   Phosphorus 2.8  2.3 - 4.6 mg/dL  TRIGLYCERIDES     Status: None   Collection Time    12/04/13  4:18 AM      Result Value Ref Range   Triglycerides 125  <150 mg/dL   Comment: Performed at [facility]  CBC     Status: Abnormal   Collection Time    12/04/13  4:18 AM      Result Value Ref Range   WBC 12.5 (*) 4.0 - 10.5 K/uL   RBC 3.65 (*) 3.87 - 5.11 MIL/uL   Hemoglobin 10.6 (*) 12.0 - 15.0 g/dL   HCT 33.4 (*) 36.0 - 46.0 %   MCV 91.5  78.0 - 100.0 fL   MCH 29.0  26.0 - 34.0 pg   MCHC 31.7  30.0 - 36.0 g/dL   RDW 14.3  11.5 - 15.5 %   Platelets 178  150 - 400 K/uL  DIFFERENTIAL  Status: Abnormal   Collection Time    12/04/13  4:18 AM      Result Value Ref Range   Neutrophils Relative % 72  43 - 77 %   Lymphocytes Relative 11 (*) 12 - 46 %   Monocytes Relative 16 (*) 3 - 12 %   Eosinophils Relative 1  0 - 5 %   Basophils Relative 0  0 - 1 %   Neutro Abs 9.0 (*) 1.7 - 7.7 K/uL   Lymphs Abs 1.4  0.7 - 4.0 K/uL   Monocytes Absolute 2.0 (*) 0.1 - 1.0 K/uL   Eosinophils Absolute 0.1  0.0 - 0.7 K/uL   Basophils Absolute 0.0  0.0 - 0.1 K/uL   WBC Morphology MILD LEFT SHIFT (1-5% METAS, OCC MYELO, OCC BANDS)      Imaging / Studies: Dg Abd 2 Views  12/03/2013   CLINICAL DATA:  Small bowel obstruction  EXAM: ABDOMEN - 2 VIEW  COMPARISON:  11/27/2013  FINDINGS: NG tube in place is noted. Mild distended small bowel loops mid abdomen and left upper abdomen with air-fluid  levels consistent with small bowel obstruction. Left hip prosthesis again noted. Degenerative changes lumbar spine. Levoscoliosis of lumbar spine again noted. Postcholecystectomy surgical clips.  IMPRESSION: NG tube in place. Mild distended small bowel loops with air-fluid levels consistent with small bowel obstruction.   Electronically Signed   By: Lahoma Crocker M.D.   On: 12/03/2013 09:04    Medications / Allergies: per chart  Antibiotics: Anti-infectives   Start     Dose/Rate Route Frequency Ordered Stop   11/28/13 0000  piperacillin-tazobactam (ZOSYN) IVPB 3.375 g     3.375 g 12.5 mL/hr over 240 Minutes Intravenous Every 8 hours 11/27/13 1623     11/27/13 1500  piperacillin-tazobactam (ZOSYN) IVPB 3.375 g     3.375 g 100 mL/hr over 30 Minutes Intravenous  Once 11/27/13 1443 11/27/13 1445   11/25/13 2359  [MAR Hold]  ceFEPIme (MAXIPIME) 1 g in dextrose 5 % 50 mL IVPB  Status:  Discontinued     (On MAR Hold since 11/27/13 1345)   1 g 100 mL/hr over 30 Minutes Intravenous Every 24 hours 11/25/13 2300 11/27/13 1633   11/24/13 1800  ampicillin-sulbactam (UNASYN) 1.5 g in sodium chloride 0.9 % 50 mL IVPB  Status:  Discontinued     1.5 g 100 mL/hr over 30 Minutes Intravenous Every 12 hours 11/24/13 1549 11/25/13 0956      Assessment/Plan DIAGNOSTIC LAPAROSCOPY, OPEN RESECTION OF SMALL BOWEL - 11/27/2013 - Dr. Alphonsa Overall - for closed loop small bowel obstruction with perforation  POD #7 -Zosyn - started 2/18  -Continue NG tube until bowel function improves and output less  -Open wound dressing changes as ordered  -PT evaluation to help with mobility  -if she does not open will obtain a CT of abdomen tomorrow to rule out an abscess -TPN DVT prophylaxis - Lovenox  Dementia / Short term memory loss  History of stroke - history of right frontal stroke with left-sided hemiparesis resulting in gait instability  HTN-per primary team  Erby Pian, [facility] Surgery Pager  [phone] Office [phone]  12/04/2013 9:45 AM

## 2013-12-04 NOTE — Progress Notes (Signed)
PHYSICAL THERAPY On arrival to room pt just got off BSC to bed with RN.  Pt was up in recliner most of the day and now fatigued.  Will re attempt in am. Felecia ShellingLori Aljean Horiuchi  PTA [facility]  Acute  Rehab Pager      [phone]

## 2013-12-04 NOTE — Progress Notes (Signed)
PARENTERAL NUTRITION CONSULT NOTE - FOLLOW UP  Pharmacy Consult for TNA Indication: SBO  Allergies  Allergen Reactions  . Sulfa Antibiotics Other (See Comments)    unknown    Patient Measurements: Height: _0  (165.1 cm) Weight: 131 lb 12.8 oz (59.784 kg) IBW/kg (Calculated) : 57 Usual Weight: 55.5 kg  Vital Signs: Temp: 97.3 F (36.3 C) (02/25 0543) Temp src: Oral (02/25 0543) BP: 173/80 mmHg (02/25 0543) Pulse Rate: 82 (02/25 0543) Intake/Output from previous day: 02/24 0701 - 02/25 0700 In: 430 [I.V.:400; NG/GT:30] Out: 3735 [Urine:2775; Emesis/NG output:960]  Labs:  Recent Labs  12/03/13 0815 12/04/13 0418  WBC 13.1* 12.5*  HGB 11.8* 10.6*  HCT 36.9 33.4*  PLT 204 178     Recent Labs  12/03/13 0815 12/04/13 0418  NA 139 140  K 4.3 3.6*  CL 102 104  CO2 24 24  GLUCOSE 100* 134*  BUN 5* 8  CREATININE 0.82 0.76  CALCIUM 8.6 8.4  MG  --  1.7  PHOS  --  2.8  PROT 6.4 5.5*  ALBUMIN 2.5* 2.2*  AST 28 25  ALT 17 16  ALKPHOS 50 44  BILITOT 0.4 0.4  TRIG  --  125   Estimated Creatinine Clearance: 42.9 ml/min (by C-G formula based on Cr of 0.76).    Recent Labs  12/03/13 2023 12/03/13 2325 12/04/13 0327  GLUCAP 109* 136* 120*    CBGs & Insulin requirements past 24 hours:  - CBGs controlled, required 2 units Novolog SSI  Assessment:  77 yoF with history of stroke resulting in left-sided hemiparesis and gait instability, hx dementia, severe scoliosis, CKD-III, presents with anorexia and N/V on 2/15, found to have SBO with perforation. Patient underwent laparotomy with small bowel resection 2/18. Given delayed recovery of bowel function, pharmacy consulted to begin TNA 2/24.   Today, 2/25, TNA D#2. Tolerating TNA. Abx X-ray 2/24 with persistent SBO. Plan possible CT abd tomorrow to r/o abscess. N output of 960 ml yesterday.   Nutritional Goals:  - RD recs 2/23: 1700-1900 KCal, 75-90g protein per day - Clinimix E 5/15 @ 65 + IVF 20% at 10  ml/hr will provide 1853 Kcal and 78 g protein per day.    Current nutrition:  - Diet: NPO - TNA: Clinimix E 5/20 @ 40 ml/hr - mIVF: D5 1/2 NS + 20 mEq Kcl per L @ 60 ml/hr   Labs: Electrolytes: K+ 3.6, other lytes wnl Renal Function: Scr wnl/stable Hepatic Function: AST/ALT, alk phos wnl Pre-Albumin: pending lab 2/25 Triglycerides: wnl on 2/25 CBGs: controlled on sensitive SSI   Plan:    Advance Clinimix E 5/20 to goal of 65 ml/hr today   TNA to contain IV fat emulsion 20% at 10 ml/hr daily  Standard multivitamins daily  Trace elements only on MWF due to national backorder  KCl 10 meq x 4 runs   Reduce IV fluids to 35 ml/hr  TNA labs Monday/Thursdays  Pharmacy will follow up daily  Vanessa [place], PharmD, BCPS Pager: [phone] 9:53 AM Pharmacy #: [id number]

## 2013-12-04 NOTE — Progress Notes (Signed)
ATTENDING ADDENDUM:  I personally reviewed patient's record, examined the patient, and formulated the following assessment and plan:  Feels fine.  Still no BM despite suppository yesterday.  Cont TPN.  Will monitor for now.  If no bowel function returns in the next few days, she may need a CT to evaluate for abscess.

## 2013-12-04 NOTE — Progress Notes (Signed)
Subjective: No complaints.  PICC line placed and TNA started.  She is    Objective: Vital signs in last 24 hours: Temp:  [97.2 F (36.2 C)-97.8 F (36.6 C)] 97.3 F (36.3 C) (02/25 0543) Pulse Rate:  [67-90] 82 (02/25 0543) Resp:  [16-17] 17 (02/25 0543) BP: (133-173)/(74-84) 173/80 mmHg (02/25 0543) SpO2:  [94 %-96 %] 96 % (02/25 0543) Weight change:  Last BM Date: 11/23/13  CBG (last 3)   Recent Labs  2013/12/31 2023 12/31/2013 2325 12/04/13 0327  GLUCAP 109* 136* 120*    Intake/Output from previous day: 12-31-22 0701 - 02/25 0700 In: 430 [I.V.:400; NG/GT:30] Out: 3735 [Urine:2775; Emesis/NG output:960] Intake/Output this shift:    General appearance: alert and no distress Eyes: no scleral icterus Throat: oropharynx moist without erythema Resp: clear to auscultation bilaterally Cardio: regular rate and rhythm GI: soft, non-tender; bowel sounds normal; no masses,  no organomegaly Extremities: no clubbing, cyanosis or edema   Lab Results:  Recent Labs  2013-12-31 0815 12/04/13 0418  NA 139 140  K 4.3 3.6*  CL 102 104  CO2 24 24  GLUCOSE 100* 134*  BUN 5* 8  CREATININE 0.82 0.76  CALCIUM 8.6 8.4  MG  --  1.7  PHOS  --  2.8    Recent Labs  12-31-2013 0815 12/04/13 0418  AST 28 25  ALT 17 16  ALKPHOS 50 44  BILITOT 0.4 0.4  PROT 6.4 5.5*  ALBUMIN 2.5* 2.2*    Recent Labs  31-Dec-2013 0815 12/04/13 0418  WBC 13.1* 12.5*  NEUTROABS  --  9.0*  HGB 11.8* 10.6*  HCT 36.9 33.4*  MCV 91.1 91.5  PLT 204 178   Lab Results  Component Value Date   INR 1.10 02/25/2011   No results found for this basename: CKTOTAL, CKMB, CKMBINDEX, TROPONINI,  in the last 72 hours No results found for this basename: TSH, T4TOTAL, FREET3, T3FREE, THYROIDAB,  in the last 72 hours No results found for this basename: VITAMINB12, FOLATE, FERRITIN, TIBC, IRON, RETICCTPCT,  in the last 72 hours  Studies/Results: Dg Abd 2 Views  12-31-2013   CLINICAL DATA:  Small bowel  obstruction  EXAM: ABDOMEN - 2 VIEW  COMPARISON:  11/27/2013  FINDINGS: NG tube in place is noted. Mild distended small bowel loops mid abdomen and left upper abdomen with air-fluid levels consistent with small bowel obstruction. Left hip prosthesis again noted. Degenerative changes lumbar spine. Levoscoliosis of lumbar spine again noted. Postcholecystectomy surgical clips.  IMPRESSION: NG tube in place. Mild distended small bowel loops with air-fluid levels consistent with small bowel obstruction.   Electronically Signed   By: Natasha Mead M.D.   On: December 31, 2013 09:04     Medications: Scheduled: . antiseptic oral rinse  15 mL Mouth Rinse q12n4p  . aspirin  325 mg Oral Daily  . chlorhexidine  15 mL Mouth Rinse BID  . enoxaparin (LOVENOX) injection  40 mg Subcutaneous Q24H  . insulin aspart  0-9 Units Subcutaneous 6 times per day  . pantoprazole (PROTONIX) IV  40 mg Intravenous Q24H  . piperacillin-tazobactam  3.375 g Intravenous Q8H   Continuous: . Marland KitchenTPN (CLINIMIX-E) Adult 40 mL/hr at 12/31/13 1736   And  . fat emulsion 250 mL (Dec 31, 2013 1736)  . dextrose 5 % and 0.45 % NaCl with KCl 20 mEq/L 60 mL (12/04/13 0713)    Assessment/Plan: Principal Problem:  1. Small bowel obstruction with perforation- delayed recovery of bowel function after Ex Lap with bowel repair (POD #7).  Abdominal X-ray shows ?persistent SBO vs. Ileus. Post-op management including NG management, diet, disposition per General Surgery. Continue TNA until tolerating pos.  Active Problems:  2. Leukocytosis-secondary to bowel perforation. Discontinue Zosyn after today if OK with GSU- has completed 7 days of antibiotics post-op 3. Severe protein calorie malnutrition-continue TNA.  4. Acute on Chronic Renal Failure- improved post-op.  5. 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 No behavioral issues.  6. History of CVA (cerebrovascular accident) with left hemiparesis- Aspirin . 7. Disposition- per general  surgery. Continue PT/OT. Discharge delayed by lack of bowel recovery, NPO status.  Discussed with son- he is agreeable to SNF but does not think patient will be agreeable based on prior experience when she left after 1 day.  She will need 24 hour care at this point and may need to consider Memory Care unit longterm if unable to provide 24 hour care at home.  Son prefers for her to come home after discharge.   LOS: 10 days   Lindsey Hensley, William 12/04/2013, 7:29 AM

## 2013-12-05 LAB — CBC
HCT: 32.8 % — ABNORMAL LOW (ref 36.0–46.0)
Hemoglobin: 10.6 g/dL — ABNORMAL LOW (ref 12.0–15.0)
MCH: 29.4 pg (ref 26.0–34.0)
MCHC: 32.3 g/dL (ref 30.0–36.0)
MCV: 90.9 fL (ref 78.0–100.0)
PLATELETS: 157 10*3/uL (ref 150–400)
RBC: 3.61 MIL/uL — ABNORMAL LOW (ref 3.87–5.11)
RDW: 14.3 % (ref 11.5–15.5)
WBC: 12.1 10*3/uL — AB (ref 4.0–10.5)

## 2013-12-05 LAB — COMPREHENSIVE METABOLIC PANEL
ALT: 16 U/L (ref 0–35)
AST: 23 U/L (ref 0–37)
Albumin: 2.3 g/dL — ABNORMAL LOW (ref 3.5–5.2)
Alkaline Phosphatase: 46 U/L (ref 39–117)
BUN: 17 mg/dL (ref 6–23)
CHLORIDE: 104 meq/L (ref 96–112)
CO2: 25 mEq/L (ref 19–32)
CREATININE: 0.79 mg/dL (ref 0.50–1.10)
Calcium: 8.5 mg/dL (ref 8.4–10.5)
GFR calc Af Amer: 83 mL/min — ABNORMAL LOW (ref >90)
GFR calc non Af Amer: 72 mL/min — ABNORMAL LOW (ref >90)
Glucose, Bld: 127 mg/dL — ABNORMAL HIGH (ref 70–99)
POTASSIUM: 4.1 meq/L (ref 3.7–5.3)
Sodium: 139 mEq/L (ref 137–147)
TOTAL PROTEIN: 5.6 g/dL — AB (ref 6.0–8.3)
Total Bilirubin: 0.4 mg/dL (ref 0.3–1.2)

## 2013-12-05 LAB — PHOSPHORUS: PHOSPHORUS: 3.2 mg/dL (ref 2.3–4.6)

## 2013-12-05 LAB — GLUCOSE, CAPILLARY
GLUCOSE-CAPILLARY: 123 mg/dL — AB (ref 70–99)
GLUCOSE-CAPILLARY: 111 mg/dL — AB (ref 70–99)
Glucose-Capillary: 117 mg/dL — ABNORMAL HIGH (ref 70–99)
Glucose-Capillary: 121 mg/dL — ABNORMAL HIGH (ref 70–99)
Glucose-Capillary: 123 mg/dL — ABNORMAL HIGH (ref 70–99)

## 2013-12-05 LAB — MAGNESIUM: MAGNESIUM: 1.8 mg/dL (ref 1.5–2.5)

## 2013-12-05 MED ORDER — CLINIMIX E/DEXTROSE (5/20) 5 % IV SOLN
INTRAVENOUS | Status: AC
  Administered 2013-12-05: 17:00:00 via INTRAVENOUS
  Filled 2013-12-05 (×3): qty 2000

## 2013-12-05 MED ORDER — FAT EMULSION 20 % IV EMUL
240.0000 mL | INTRAVENOUS | Status: AC
  Administered 2013-12-05: 240 mL via INTRAVENOUS
  Filled 2013-12-05: qty 250

## 2013-12-05 MED ORDER — BISACODYL 10 MG RE SUPP
10.0000 mg | Freq: Once | RECTAL | Status: AC
  Administered 2013-12-05: 10 mg via RECTAL
  Filled 2013-12-05: qty 1

## 2013-12-05 MED ORDER — INSULIN ASPART 100 UNIT/ML ~~LOC~~ SOLN
0.0000 [IU] | Freq: Four times a day (QID) | SUBCUTANEOUS | Status: DC
  Administered 2013-12-05 – 2013-12-10 (×6): 1 [IU] via SUBCUTANEOUS

## 2013-12-05 NOTE — Progress Notes (Signed)
OT Cancellation Note  Patient Details Name: Dorris CarnesRuth S Espana MRN: [id number] DOB: 10/01/1922   Cancelled Treatment:    Reason Eval/Treat Not Completed: Other (comment).  Checked with pt.  She had no toileting needs and adl was done.  Will check another day.    Nachmen Mansel 12/05/2013, 2:59 PM Marica OtterMaryellen Missie Gehrig, OTR/L [phone] 12/05/2013

## 2013-12-05 NOTE — Progress Notes (Signed)
Subjective: No complaints.  2 BM this am.  No abdominal pain.  Objective: Vital signs in last 24 hours: Temp:  [97.3 F (36.3 C)-98.3 F (36.8 C)] 98.3 F (36.8 C) (02/26 0555) Pulse Rate:  [84-93] 91 (02/26 0555) Resp:  [16] 16 (02/26 0555) BP: (130-155)/(66-90) 130/66 mmHg (02/26 0555) SpO2:  [94 %-98 %] 97 % (02/26 0555) Weight change:  Last BM Date: 12/05/13  CBG (last 3)   Recent Labs  12/04/13 2332 12/05/13 0346 12/05/13 0749  GLUCAP 123* 117* 123*    Intake/Output from previous day: 02/25 0701 - 02/26 0700 In: 3740.8 [I.V.:3060.8; NG/GT:30; IV Piggyback:650] Out: 2400 [Urine:2200; Emesis/NG output:200] Intake/Output this shift: Total I/O In: -  Out: 400 [Urine:400]  General appearance: alert and no distress Eyes: no scleral icterus Throat: oropharynx moist without erythema Resp: clear to auscultation bilaterally Cardio: regular rate and rhythm GI: soft, non-tender; minimal bowel sounds; NG with bilious drainage Extremities: no clubbing, cyanosis or edema   Lab Results:  Recent Labs  12/04/13 0418 12/05/13 0530  NA 140 139  K 3.6* 4.1  CL 104 104  CO2 24 25  GLUCOSE 134* 127*  BUN 8 17  CREATININE 0.76 0.79  CALCIUM 8.4 8.5  MG 1.7 1.8  PHOS 2.8 3.2    Recent Labs  12/04/13 0418 12/05/13 0530  AST 25 23  ALT 16 16  ALKPHOS 44 46  BILITOT 0.4 0.4  PROT 5.5* 5.6*  ALBUMIN 2.2* 2.3*    Recent Labs  12/04/13 0418 12/05/13 0811  WBC 12.5* 12.1*  NEUTROABS 9.0*  --   HGB 10.6* 10.6*  HCT 33.4* 32.8*  MCV 91.5 90.9  PLT 178 157   Lab Results  Component Value Date   INR 1.10 02/25/2011   No results found for this basename: CKTOTAL, CKMB, CKMBINDEX, TROPONINI,  in the last 72 hours No results found for this basename: TSH, T4TOTAL, FREET3, T3FREE, THYROIDAB,  in the last 72 hours No results found for this basename: VITAMINB12, FOLATE, FERRITIN, TIBC, IRON, RETICCTPCT,  in the last 72 hours  Studies/Results: No results  found.   Medications: Scheduled: . antiseptic oral rinse  15 mL Mouth Rinse q12n4p  . aspirin  325 mg Oral Daily  . chlorhexidine  15 mL Mouth Rinse BID  . enoxaparin (LOVENOX) injection  40 mg Subcutaneous Q24H  . insulin aspart  0-9 Units Subcutaneous 4 times per day  . pantoprazole (PROTONIX) IV  40 mg Intravenous Q24H  . piperacillin-tazobactam  3.375 g Intravenous Q8H   Continuous: . Marland Kitchen.TPN (CLINIMIX-E) Adult 65 mL/hr at 12/04/13 1828   And  . fat emulsion 240 mL (12/04/13 1829)  . dextrose 5 % and 0.45 % NaCl with KCl 20 mEq/L 35 mL/hr at 12/04/13 1821  . Marland Kitchen.TPN (CLINIMIX-E) Adult     And  . fat emulsion      Assessment/Plan: Principal Problem:  1. Small bowel obstruction with perforation s/p Ex Lap with bowel repair (POD #8). BM today is encouraging for recovery of bowel function.  Clamp trial today per GI.  Hopefully can discontinue and start liquids tomorrow. Post-op management including NG management, diet, disposition per General Surgery.  Continue TNA until tolerating pos.Active Problems:  2. Leukocytosis-secondary to bowel perforation. Discontinue Zosyn-completed 7 days of antibiotics post-op. 3. Severe protein calorie malnutrition-continue TNA.  4. Acute on Chronic Renal Failure- improved post-op.  5. 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 No behavioral issues.  6. History of CVA (cerebrovascular accident) with left hemiparesis-  Aspirin restarted. BP reasonable (avoid hypotension due to prior CVA in setting of relative hypotension) 7. Disposition- per general surgery. Continue PT/OT. Discharge delayed by lack of bowel recovery, NPO status. Discussed with son- he is agreeable to SNF but does not think patient will be agreeable based on prior experience when she left after 1 day. She will need 24 hour care at this point and may need to consider Memory Care unit longterm if unable to provide 24 hour care at home. Son prefers for her to come home after  discharge.  Possible discharge in 3-4 days if tolerating pos.    LOS: 11 days   Lindsey Hensley 12/05/2013, 12:20 PM

## 2013-12-05 NOTE — Progress Notes (Signed)
Patient ID: Lindsey Hensley, female   DOB: 10/01/1922, 80 y.o.   MRN: [id number]  Subjective: Passed flatus.  BP better.  White count is trending down.  Sitting up in chair, but not much mobility.    Objective:  Vital signs:  Filed Vitals:   12/04/13 0543 12/04/13 1423 12/04/13 2130 12/05/13 0555  BP: 173/80 140/71 155/90 130/66  Pulse: 82 84 93 91  Temp: 97.3 F (36.3 C) 97.3 F (36.3 C) 98.2 F (36.8 C) 98.3 F (36.8 C)  TempSrc: Oral Oral Oral Oral  Resp: 17 16 16 16   Height:      Weight:      SpO2: 96% 98% 94% 97%    Last BM Date: 12/05/13  Intake/Output   Yesterday:  02/25 0701 - 02/26 0700 In: 3740.8 [I.V.:3060.8; NG/GT:30; IV Piggyback:650] Out: 2400 [Urine:2200; Emesis/NG output:200] This shift:  Total I/O In: -  Out: 400 [Urine:400]  Physical Exam:  General: Pt awake/alert/oriented x3 in no acute distress  Abdomen: Soft. Mild distention Mildly tender at incision only. The wound is beefy red, clean, replaced.    Problem List:   Principal Problem:   Nausea and vomiting in adult patient Active Problems:   History of CVA (cerebrovascular accident)   Leukocytosis   Nausea and vomiting   Small bowel obstruction, partial   Dementia in Alzheimer's disease with delirium   Acute on chronic renal failure   Hemiparesis affecting left side as late effect of cerebrovascular accident    Results:   Labs: Results for orders placed during the hospital encounter of 11/24/13 (from the past 48 hour(s))  GLUCOSE, CAPILLARY     Status: Abnormal   Collection Time    12/03/13  8:23 PM      Result Value Ref Range   Glucose-Capillary 109 (*) 70 - 99 mg/dL  GLUCOSE, CAPILLARY     Status: Abnormal   Collection Time    12/03/13 11:25 PM      Result Value Ref Range   Glucose-Capillary 136 (*) 70 - 99 mg/dL  GLUCOSE, CAPILLARY     Status: Abnormal   Collection Time    12/04/13  3:27 AM      Result Value Ref Range   Glucose-Capillary 120 (*) 70 - 99 mg/dL   COMPREHENSIVE METABOLIC PANEL     Status: Abnormal   Collection Time    12/04/13  4:18 AM      Result Value Ref Range   Sodium 140  137 - 147 mEq/L   Potassium 3.6 (*) 3.7 - 5.3 mEq/L   Chloride 104  96 - 112 mEq/L   CO2 24  19 - 32 mEq/L   Glucose, Bld 134 (*) 70 - 99 mg/dL   BUN 8  6 - 23 mg/dL   Creatinine, Ser 0.76  0.50 - 1.10 mg/dL   Calcium 8.4  8.4 - 10.5 mg/dL   Total Protein 5.5 (*) 6.0 - 8.3 g/dL   Albumin 2.2 (*) 3.5 - 5.2 g/dL   AST 25  0 - 37 U/L   ALT 16  0 - 35 U/L   Alkaline Phosphatase 44  39 - 117 U/L   Total Bilirubin 0.4  0.3 - 1.2 mg/dL   GFR calc non Af Amer 73 (*) >90 mL/min   GFR calc Af Amer 84 (*) >90 mL/min   Comment: (NOTE)     The eGFR has been calculated using the CKD EPI equation.     This calculation has not been validated  in all clinical situations.     eGFR's persistently <90 mL/min signify possible Chronic Kidney     Disease.  PREALBUMIN     Status: Abnormal   Collection Time    12/04/13  4:18 AM      Result Value Ref Range   Prealbumin 12.7 (*) 17.0 - 34.0 mg/dL   Comment: Performed at [place]     Status: None   Collection Time    12/04/13  4:18 AM      Result Value Ref Range   Magnesium 1.7  1.5 - 2.5 mg/dL  PHOSPHORUS     Status: None   Collection Time    12/04/13  4:18 AM      Result Value Ref Range   Phosphorus 2.8  2.3 - 4.6 mg/dL  TRIGLYCERIDES     Status: None   Collection Time    12/04/13  4:18 AM      Result Value Ref Range   Triglycerides 125  <150 mg/dL   Comment: Performed at [facility]  CBC     Status: Abnormal   Collection Time    12/04/13  4:18 AM      Result Value Ref Range   WBC 12.5 (*) 4.0 - 10.5 K/uL   RBC 3.65 (*) 3.87 - 5.11 MIL/uL   Hemoglobin 10.6 (*) 12.0 - 15.0 g/dL   HCT 33.4 (*) 36.0 - 46.0 %   MCV 91.5  78.0 - 100.0 fL   MCH 29.0  26.0 - 34.0 pg   MCHC 31.7  30.0 - 36.0 g/dL   RDW 14.3  11.5 - 15.5 %   Platelets 178  150 - 400 K/uL  DIFFERENTIAL     Status:  Abnormal   Collection Time    12/04/13  4:18 AM      Result Value Ref Range   Neutrophils Relative % 72  43 - 77 %   Lymphocytes Relative 11 (*) 12 - 46 %   Monocytes Relative 16 (*) 3 - 12 %   Eosinophils Relative 1  0 - 5 %   Basophils Relative 0  0 - 1 %   Neutro Abs 9.0 (*) 1.7 - 7.7 K/uL   Lymphs Abs 1.4  0.7 - 4.0 K/uL   Monocytes Absolute 2.0 (*) 0.1 - 1.0 K/uL   Eosinophils Absolute 0.1  0.0 - 0.7 K/uL   Basophils Absolute 0.0  0.0 - 0.1 K/uL   WBC Morphology MILD LEFT SHIFT (1-5% METAS, OCC MYELO, OCC BANDS)    GLUCOSE, CAPILLARY     Status: Abnormal   Collection Time    12/04/13  7:42 AM      Result Value Ref Range   Glucose-Capillary 127 (*) 70 - 99 mg/dL  GLUCOSE, CAPILLARY     Status: None   Collection Time    12/04/13 12:08 PM      Result Value Ref Range   Glucose-Capillary 90  70 - 99 mg/dL  GLUCOSE, CAPILLARY     Status: Abnormal   Collection Time    12/04/13  4:09 PM      Result Value Ref Range   Glucose-Capillary 117 (*) 70 - 99 mg/dL  GLUCOSE, CAPILLARY     Status: Abnormal   Collection Time    12/04/13  7:38 PM      Result Value Ref Range   Glucose-Capillary 134 (*) 70 - 99 mg/dL  GLUCOSE, CAPILLARY     Status: Abnormal   Collection  Time    12/04/13 11:32 PM      Result Value Ref Range   Glucose-Capillary 123 (*) 70 - 99 mg/dL  GLUCOSE, CAPILLARY     Status: Abnormal   Collection Time    12/05/13  3:46 AM      Result Value Ref Range   Glucose-Capillary 117 (*) 70 - 99 mg/dL  COMPREHENSIVE METABOLIC PANEL     Status: Abnormal   Collection Time    12/05/13  5:30 AM      Result Value Ref Range   Sodium 139  137 - 147 mEq/L   Potassium 4.1  3.7 - 5.3 mEq/L   Chloride 104  96 - 112 mEq/L   CO2 25  19 - 32 mEq/L   Glucose, Bld 127 (*) 70 - 99 mg/dL   BUN 17  6 - 23 mg/dL   Creatinine, Ser 0.79  0.50 - 1.10 mg/dL   Calcium 8.5  8.4 - 10.5 mg/dL   Total Protein 5.6 (*) 6.0 - 8.3 g/dL   Albumin 2.3 (*) 3.5 - 5.2 g/dL   AST 23  0 - 37 U/L   ALT 16   0 - 35 U/L   Alkaline Phosphatase 46  39 - 117 U/L   Total Bilirubin 0.4  0.3 - 1.2 mg/dL   GFR calc non Af Amer 72 (*) >90 mL/min   GFR calc Af Amer 83 (*) >90 mL/min   Comment: (NOTE)     The eGFR has been calculated using the CKD EPI equation.     This calculation has not been validated in all clinical situations.     eGFR's persistently <90 mL/min signify possible Chronic Kidney     Disease.  MAGNESIUM     Status: None   Collection Time    12/05/13  5:30 AM      Result Value Ref Range   Magnesium 1.8  1.5 - 2.5 mg/dL  PHOSPHORUS     Status: None   Collection Time    12/05/13  5:30 AM      Result Value Ref Range   Phosphorus 3.2  2.3 - 4.6 mg/dL  GLUCOSE, CAPILLARY     Status: Abnormal   Collection Time    12/05/13  7:49 AM      Result Value Ref Range   Glucose-Capillary 123 (*) 70 - 99 mg/dL   Comment 1 Notify RN    CBC     Status: Abnormal   Collection Time    12/05/13  8:11 AM      Result Value Ref Range   WBC 12.1 (*) 4.0 - 10.5 K/uL   RBC 3.61 (*) 3.87 - 5.11 MIL/uL   Hemoglobin 10.6 (*) 12.0 - 15.0 g/dL   HCT 32.8 (*) 36.0 - 46.0 %   MCV 90.9  78.0 - 100.0 fL   MCH 29.4  26.0 - 34.0 pg   MCHC 32.3  30.0 - 36.0 g/dL   RDW 14.3  11.5 - 15.5 %   Platelets 157  150 - 400 K/uL    Imaging / Studies: No results found.  Medications / Allergies: per chart  Antibiotics: Anti-infectives   Start     Dose/Rate Route Frequency Ordered Stop   11/28/13 0000  piperacillin-tazobactam (ZOSYN) IVPB 3.375 g     3.375 g 12.5 mL/hr over 240 Minutes Intravenous Every 8 hours 11/27/13 1623     11/27/13 1500  piperacillin-tazobactam (ZOSYN) IVPB 3.375 g     3.375 g 100  mL/hr over 30 Minutes Intravenous  Once 11/27/13 1443 11/27/13 1445   11/25/13 2359  [MAR Hold]  ceFEPIme (MAXIPIME) 1 g in dextrose 5 % 50 mL IVPB  Status:  Discontinued     (On MAR Hold since 11/27/13 1345)   1 g 100 mL/hr over 30 Minutes Intravenous Every 24 hours 11/25/13 2300 11/27/13 1633   11/24/13 1800   ampicillin-sulbactam (UNASYN) 1.5 g in sodium chloride 0.9 % 50 mL IVPB  Status:  Discontinued     1.5 g 100 mL/hr over 30 Minutes Intravenous Every 12 hours 11/24/13 1549 11/25/13 0956     Assessment/Plan  DIAGNOSTIC LAPAROSCOPY, OPEN RESECTION OF SMALL BOWEL - 11/27/2013 - Dr. Alphonsa Overall - for closed loop small bowel obstruction with perforation  POD #8  -Zosyn - started 2/18 ?duration of therapy -NGT output much less(257m recorded 2/25).  Passing flatus.  Give dulcolax suppository.  Start clamping trial. -we will hold off on CT scan. White count is trending down and the patient may finally be opening up.  -Open wound dressing changes as ordered  -PT evaluation to help with mobility  -TPN  DVT prophylaxis - Lovenox  Dementia / Short term memory loss  History of stroke - history of right frontal stroke with left-sided hemiparesis resulting in gait instability  HTN-per primary team  EErby Pian AJane [facility] Center[facility] Pager [phone] 12/05/2013 9:56 AM

## 2013-12-05 NOTE — Progress Notes (Signed)
ATTENDING ADDENDUM:  I personally reviewed patient's record, examined the patient, and formulated the following assessment and plan:  NG clamped.  If tolerates this, will d/c NG.  Abd benign

## 2013-12-05 NOTE — Progress Notes (Signed)
PARENTERAL NUTRITION CONSULT NOTE - FOLLOW UP  Pharmacy Consult for TNA Indication: SBO  Allergies  Allergen Reactions  . Sulfa Antibiotics Other (See Comments)    unknown    Patient Measurements: Height: 5' 5"  (165.1 cm) Weight: 131 lb 12.8 oz (59.784 kg) IBW/kg (Calculated) : 57 Usual Weight: 55.5 kg  Vital Signs: Temp: 98.3 F (36.8 C) (02/26 0555) Temp src: Oral (02/26 0555) BP: 130/66 mmHg (02/26 0555) Pulse Rate: 91 (02/26 0555) Intake/Output from previous day: 02/25 0701 - 02/26 0700 In: 3740.8 [I.V.:3060.8; NG/GT:30; IV Piggyback:650] Out: 2400 [Urine:2200; Emesis/NG output:200]  Labs:  Recent Labs  12/03/13 0815 12/04/13 0418 12/05/13 0811  WBC 13.1* 12.5* 12.1*  HGB 11.8* 10.6* 10.6*  HCT 36.9 33.4* 32.8*  PLT 204 178 157     Recent Labs  12/03/13 0815 12/04/13 0418 12/05/13 0530  NA 139 140 139  K 4.3 3.6* 4.1  CL 102 104 104  CO2 24 24 25   GLUCOSE 100* 134* 127*  BUN 5* 8 17  CREATININE 0.82 0.76 0.79  CALCIUM 8.6 8.4 8.5  MG  --  1.7 1.8  PHOS  --  2.8 3.2  PROT 6.4 5.5* 5.6*  ALBUMIN 2.5* 2.2* 2.3*  AST 28 25 23   ALT 17 16 16   ALKPHOS 50 44 46  BILITOT 0.4 0.4 0.4  PREALBUMIN  --  12.7*  --   TRIG  --  125  --    Estimated Creatinine Clearance: 42.9 ml/min (by C-G formula based on Cr of 0.79).    Recent Labs  12/04/13 2332 12/05/13 0346 12/05/13 0749  GLUCAP 123* 117* 123*    CBGs & Insulin requirements past 24 hours:  - CBGs < 150, required 4 units Novolog SSI  Assessment:  27 yoF with history of stroke resulting in left-sided hemiparesis and gait instability, hx dementia, severe scoliosis, CKD-III, presents with anorexia and N/V on 2/15, found to have SBO with perforation. Patient underwent laparotomy with small bowel resection 2/18. Given delayed recovery of bowel function, pharmacy consulted to begin TNA 2/24.   Today, 2/26, TNA D#3. Tolerating TNA. Abd X-ray 2/24 with persistent SBO. If no return of bowel fxn within  the next few days, will repeat CT to r/o abscess. NG output of 200 ml yesterday.   Nutritional Goals:  - RD recs 2/23: 1700-1900 KCal, 75-90g protein per day - Clinimix E 5/20 @ 65 + IVF 20% at 10 ml/hr will provide 1853 Kcal and 78 g protein per day.    Current nutrition:  - Diet: NPO - TNA: Clinimix E 5/20 @ 65 ml/hr - mIVF: D5 1/2 NS + 20 mEq Kcl per L @ 35 ml/hr   Labs: Electrolytes: K+ wnl after repletion, other lytes wnl Renal Function: Scr wnl/stable Hepatic Function: AST/ALT, alk phos wnl Pre-Albumin: 12.7 on 2/25 Triglycerides: wnl on 2/25 CBGs: controlled on sensitive SSI   Plan:    Continue Clinimix E 5/20 to goal of 65 ml/hr today   TNA to contain IV fat emulsion 20% at 10 ml/hr daily  Standard multivitamins daily  Trace elements only on MWF due to national backorder  Continue IV fluid at 35 ml/hr  Change sensitive SS to 4 times daily  TNA labs Monday/Thursdays  Pharmacy will follow up daily  Vanessa [place], PharmD, BCPS Pager: [phone] 8:46 AM Pharmacy #: [id number]

## 2013-12-06 LAB — GLUCOSE, CAPILLARY
Glucose-Capillary: 120 mg/dL — ABNORMAL HIGH (ref 70–99)
Glucose-Capillary: 122 mg/dL — ABNORMAL HIGH (ref 70–99)
Glucose-Capillary: 114 mg/dL — ABNORMAL HIGH (ref 70–99)

## 2013-12-06 MED ORDER — FAT EMULSION 20 % IV EMUL
240.0000 mL | INTRAVENOUS | Status: AC
  Administered 2013-12-06: 240 mL via INTRAVENOUS
  Filled 2013-12-06: qty 250

## 2013-12-06 MED ORDER — TRACE MINERALS CR-CU-F-FE-I-MN-MO-SE-ZN IV SOLN
INTRAVENOUS | Status: AC
  Administered 2013-12-06: 18:00:00 via INTRAVENOUS
  Filled 2013-12-06: qty 2000

## 2013-12-06 NOTE — Progress Notes (Signed)
Came to bedside to speak with patient and son, Lindsey SettingDonald Hensley. [name]. These services will not interfere or replace home health. Patient will receive post hospital discharge call and will be evaluated for monthly home visits. Confirmed with [name] that he should be called at [phone] on his cell post hospital discharge. Left contact information at bedside.  Raiford NobleAtika Hall, MSN- RN,BSN- [facility] Hospital [phone]

## 2013-12-06 NOTE — Progress Notes (Signed)
Subjective: Feels weak.  9 BM recorded.  Patient does not recall.  Denies abdominal pain.  No N/V.  NG clamped.    Objective: Vital signs in last 24 hours: Temp:  [97.8 F (36.6 C)-98.6 F (37 C)] 97.8 F (36.6 C) (02/27 0509) Pulse Rate:  [92-103] 92 (02/27 0509) Resp:  [16-18] 18 (02/27 0509) BP: (135-145)/(60-75) 135/60 mmHg (02/27 0509) SpO2:  [96 %-100 %] 96 % (02/27 0509) Weight change:  Last BM Date: 12/05/13  CBG (last 3)   Recent Labs  12/05/13 1756 12/05/13 2338 12/06/13 0527  GLUCAP 121* 123* 120*    Intake/Output from previous day: 02/26 0701 - 02/27 0700 In: 380 [I.V.:280; IV Piggyback:100] Out: 1200 [Urine:1200] Intake/Output this shift:    General appearance: alert and no distress Eyes: no scleral icterus Throat: oropharynx moist without erythema Resp: clear to auscultation bilaterally Cardio: regular rate and rhythm GI: soft, non-tender; hypoactive bowel sounds but present Extremities: no clubbing, cyanosis or edema   Lab Results:  Recent Labs  12/04/13 0418 12/05/13 0530  NA 140 139  K 3.6* 4.1  CL 104 104  CO2 24 25  GLUCOSE 134* 127*  BUN 8 17  CREATININE 0.76 0.79  CALCIUM 8.4 8.5  MG 1.7 1.8  PHOS 2.8 3.2    Recent Labs  12/04/13 0418 12/05/13 0530  AST 25 23  ALT 16 16  ALKPHOS 44 46  BILITOT 0.4 0.4  PROT 5.5* 5.6*  ALBUMIN 2.2* 2.3*    Recent Labs  12/04/13 0418 12/05/13 0811  WBC 12.5* 12.1*  NEUTROABS 9.0*  --   HGB 10.6* 10.6*  HCT 33.4* 32.8*  MCV 91.5 90.9  PLT 178 157   Lab Results  Component Value Date   INR 1.10 02/25/2011   No results found for this basename: CKTOTAL, CKMB, CKMBINDEX, TROPONINI,  in the last 72 hours No results found for this basename: TSH, T4TOTAL, FREET3, T3FREE, THYROIDAB,  in the last 72 hours No results found for this basename: VITAMINB12, FOLATE, FERRITIN, TIBC, IRON, RETICCTPCT,  in the last 72 hours  Studies/Results: No results found.   Medications: Scheduled: .  antiseptic oral rinse  15 mL Mouth Rinse q12n4p  . aspirin  325 mg Oral Daily  . chlorhexidine  15 mL Mouth Rinse BID  . enoxaparin (LOVENOX) injection  40 mg Subcutaneous Q24H  . insulin aspart  0-9 Units Subcutaneous 4 times per day  . pantoprazole (PROTONIX) IV  40 mg Intravenous Q24H   Continuous: . dextrose 5 % and 0.45 % NaCl with KCl 20 mEq/L 35 mL/hr at 12/05/13 2346  . Marland Kitchen.TPN (CLINIMIX-E) Adult 65 mL/hr at 12/05/13 1720   And  . fat emulsion 240 mL (12/05/13 1720)    Assessment/Plan: Principal Problem:  1. Small bowel obstruction with perforation s/p Ex Lap with bowel repair (POD #9). Multiple BMs yesterday.  NG clamped.  Post-op management including NG management, diet, disposition per General Surgery. Continue TNA until tolerating pos. Active Problems:  2. Leukocytosis-secondary to bowel perforation. Completed 7 days of Zosyn post-op. Recheck CBC in am. 3. Severe protein calorie malnutrition-continue TNA.    4. Acute on Chronic Renal Failure- improved. 5. 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 No behavioral issues.  6. History of CVA (cerebrovascular accident) with left hemiparesis- Aspirin restarted. BP reasonable (avoid hypotension due to prior CVA in setting of relative hypotension)  7. Disposition- per general surgery. Continue PT/OT. Discharge delayed by delayed bowel recovery, prolonged NPO status. Son is agreeable to  SNF but does not think patient will be agreeable based on prior experience when she left after 1 day. Son prefers for her to come home after discharge.  She will need 24 hour care at this point and may need to consider Memory Care unit longterm if unable to provide 24 hour care at home. Possible discharge in 2-3 days if tolerating pos.    LOS: 12 days   Lindsey Hensley 12/06/2013, 7:31 AM

## 2013-12-06 NOTE — Progress Notes (Signed)
Physical Therapy Treatment Patient Details Name: Lindsey CarnesRuth S Hensley MRN: [id number] DOB: 11/19/1921 Today's Date: 12/06/2013 Time: [id number] PT Time Calculation (min): 28 min  PT Assessment / Plan / Recommendation  History of Present Illness pt is s/p exploratory lap   she has a h/o CVA, dementia, L THA 6 months ago   PT Comments   Pt is progressing well with her mobility and tolerated amb around full unit.    Follow Up Recommendations  Home health PT (son prefers pt be D/C to home)     Does the patient have the potential to tolerate intense rehabilitation     Barriers to Discharge        Equipment Recommendations  None recommended by PT    Recommendations for Other Services    Frequency Min 3X/week   Progress towards PT Goals Progress towards PT goals: Progressing toward goals  Plan      Precautions / Restrictions Precautions Precautions: Fall Precaution Comments: dementia with short term memory loss   Pertinent Vitals/Pain     Mobility  Bed Mobility Overal bed mobility: Modified Independent General bed mobility comments: increased time Transfers Overall transfer level: Needs assistance Equipment used: Rolling walker (2 wheeled) Transfers: Sit to/from Stand Sit to Stand: Min guard;Supervision General transfer comment: multimodal cues for safety esp with stand to sit to reach back prior Ambulation/Gait Ambulation/Gait assistance: Supervision;Min guard Ambulation Distance (Feet): 350 Feet Assistive device: Rolling walker (2 wheeled) Gait Pattern/deviations: Step-through pattern Gait velocity: decreased General Gait Details: pt tolerated amb around full unit with only 2 sitting rest breaks.  Mild c/o dizziness BP sustaining 121/73.  Required cueing for proper walker use esp with turns and negociating around obsticles.  Pt not use to use using one and believe she would amb fine without within home.       PT Goals (current goals can now be found in the care plan section)    Visit Information  Last PT Received On: 12/06/13 Assistance Needed: +1 History of Present Illness: pt is s/p exploratory lap   she has a h/o CVA, dementia, L THA 6 months ago    Subjective Data      Cognition       Balance     End of Session PT - End of Session Equipment Utilized During Treatment: Gait belt Activity Tolerance: Patient tolerated treatment well Patient left: in chair;with call bell/phone within reach;with family/visitor present   Felecia ShellingLori Bralin Garry  PTA [facility]  Acute  Rehab Pager      [phone]

## 2013-12-06 NOTE — Progress Notes (Signed)
PARENTERAL NUTRITION CONSULT NOTE - FOLLOW UP  Pharmacy Consult for TNA Indication: SBO  Allergies  Allergen Reactions  . Sulfa Antibiotics Other (See Comments)    unknown    Patient Measurements: Height: _0  (165.1 cm) Weight: 131 lb 12.8 oz (59.784 kg) IBW/kg (Calculated) : 57 Usual Weight: 55.5 kg  Vital Signs: Temp: 97.8 F (36.6 C) (02/27 0509) Temp src: Oral (02/27 0509) BP: 135/60 mmHg (02/27 0509) Pulse Rate: 92 (02/27 0509) Intake/Output from previous day: 02/26 0701 - 02/27 0700 In: 380 [I.V.:280; IV Piggyback:100] Out: 1200 [Urine:1200]  Labs:  Recent Labs  12/04/13 0418 12/05/13 0811  WBC 12.5* 12.1*  HGB 10.6* 10.6*  HCT 33.4* 32.8*  PLT 178 157     Recent Labs  12/04/13 0418 12/05/13 0530  NA 140 139  K 3.6* 4.1  CL 104 104  CO2 24 25  GLUCOSE 134* 127*  BUN 8 17  CREATININE 0.76 0.79  CALCIUM 8.4 8.5  MG 1.7 1.8  PHOS 2.8 3.2  PROT 5.5* 5.6*  ALBUMIN 2.2* 2.3*  AST 25 23  ALT 16 16  ALKPHOS 44 46  BILITOT 0.4 0.4  PREALBUMIN 12.7*  --   TRIG 125  --    Estimated Creatinine Clearance: 42.9 ml/min (by C-G formula based on Cr of 0.79).    Recent Labs  12/05/13 1756 12/05/13 2338 12/06/13 0527  GLUCAP 121* 123* 120*    CBGs & Insulin requirements past 24 hours:  - CBGs < 150, required 3 units Novolog SSI  Assessment:  15 yoF with history of stroke resulting in left-sided hemiparesis and gait instability, hx dementia, severe scoliosis, CKD-III, presents with anorexia and N/V on 2/15, found to have SBO with perforation. Patient underwent laparotomy with small bowel resection 2/18. Given delayed recovery of bowel function, pharmacy consulted to begin TNA 2/24.  Today, 2/27, TNA D#4. Tolerating TNA. NGT output decreased, passing flatus and multiple BM yesterday.  per surgery hold off on repeat CT scan, WBC trending down and patient improving.  NG tubed clamped.  No N/V.  Continue TNA until tolerating PO's  Nutritional Goals:   - RD recs 2/23: 1700-1900 KCal, 75-90g protein per day - Clinimix E 5/20 @ 65 + IVF 20% at 10 ml/hr will provide 1853 Kcal and 78 g protein per day.    Current nutrition:  - Diet: NPO except ice cips - TNA: Clinimix E 5/20 @ 65 ml/hr - mIVF: D5 1/2 NS + 20 mEq Kcl per L @ 35 ml/hr   Labs: Electrolytes: K+ wnl after repletion, other lytes wnl on 2/26 Renal Function: Scr wnl/stable Hepatic Function: AST/ALT, alk phos wnl Pre-Albumin: 12.7 on 2/25 Triglycerides: wnl on 2/25 CBGs: controlled on sensitive SSI   Plan:    Continue Clinimix E 5/20 at goal of 65 ml/hr today   TNA to contain IV fat emulsion 20% at 10 ml/hr daily  Standard multivitamins daily  Trace elements only on MWF due to national backorder  Continue IV fluid at 35 ml/hr  Change sensitive SS to 4 times daily  TNA labs Monday/Thursdays  F/u diet advancement  Lindsey Hensley, Lindsey Hensley PharmD Pager #: [phone] 9:24 AM 12/06/2013

## 2013-12-06 NOTE — Progress Notes (Signed)
ATTENDING ADDENDUM:  I personally reviewed patient's record, examined the patient, and formulated the following assessment and plan:  Pt seems to be progressing.  Having some bowel function.  Started clears.

## 2013-12-06 NOTE — Progress Notes (Signed)
Patient ID: Lindsey Hensley, female   DOB: 22-Sep-1922, 80 y.o.   MRN: [id number]   Subjective: Pt had several recorded BMs.  She has dementia and is unaware.  Tolerated clamping trial.    Objective:  Vital signs:  Filed Vitals:   12/05/13 0555 12/05/13 1330 12/05/13 2113 12/06/13 0509  BP: 130/66 144/73 145/75 135/60  Pulse: 91 98 103 92  Temp: 98.3 F (36.8 C) 98.1 F (36.7 C) 98.6 F (37 C) 97.8 F (36.6 C)  TempSrc: Oral Oral Oral Oral  Resp: _0 Height:      Weight:      SpO2: 97% 98% 100% 96%    Last BM Date: 12/05/13  Intake/Output   Yesterday:  02/26 0701 - 02/27 0700 In: 380 [I.V.:280; IV Piggyback:100] Out: 1200 [Urine:1200] This shift:  Total I/O In: -  Out: 200 [Urine:200]   Physical Exam: General: Pt awake/alert/oriented in no acute distress Chest: CTA No chest wall pain w good excursion CV:  Pulses intact.  Regular rhythm MS: Normal AROM mjr joints.  No obvious deformity Abdomen: Soft.  Nondistended.  Midline wound-dressing is c/d/i.  No evidence of peritonitis.  No incarcerated hernias. Ext:  SCDs BLE.  No mjr edema.  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   Acute on chronic renal failure   Hemiparesis affecting left side as late effect of cerebrovascular accident    Results:   Labs: Results for orders placed during the hospital encounter of 11/24/13 (from the past 48 hour(s))  GLUCOSE, CAPILLARY     Status: None   Collection Time    12/04/13 12:08 PM      Result Value Ref Range   Glucose-Capillary 90  70 - 99 mg/dL  GLUCOSE, CAPILLARY     Status: Abnormal   Collection Time    12/04/13  4:09 PM      Result Value Ref Range   Glucose-Capillary 117 (*) 70 - 99 mg/dL   GLUCOSE, CAPILLARY     Status: Abnormal   Collection Time    12/04/13  7:38 PM      Result Value Ref Range   Glucose-Capillary 134 (*) 70 - 99 mg/dL  GLUCOSE, CAPILLARY     Status: Abnormal   Collection Time    12/04/13 11:32 PM      Result Value Ref Range   Glucose-Capillary 123 (*) 70 - 99 mg/dL  GLUCOSE, CAPILLARY     Status: Abnormal   Collection Time    12/05/13  3:46 AM      Result Value Ref Range   Glucose-Capillary 117 (*) 70 - 99 mg/dL  COMPREHENSIVE METABOLIC PANEL     Status: Abnormal   Collection Time    12/05/13  5:30 AM      Result Value Ref Range   Sodium 139  137 - 147 mEq/L   Potassium 4.1  3.7 - 5.3 mEq/L   Chloride 104  96 - 112 mEq/L   CO2 25  19 - 32 mEq/L   Glucose, Bld 127 (*) 70 - 99 mg/dL   BUN 17  6 - 23 mg/dL   Creatinine, Ser 0.79  0.50 - 1.10 mg/dL   Calcium 8.5  8.4 -  10.5 mg/dL   Total Protein 5.6 (*) 6.0 - 8.3 g/dL   Albumin 2.3 (*) 3.5 - 5.2 g/dL   AST 23  0 - 37 U/L   ALT 16  0 - 35 U/L   Alkaline Phosphatase 46  39 - 117 U/L   Total Bilirubin 0.4  0.3 - 1.2 mg/dL   GFR calc non Af Amer 72 (*) >90 mL/min   GFR calc Af Amer 83 (*) >90 mL/min   Comment: (NOTE)     The eGFR has been calculated using the CKD EPI equation.     This calculation has not been validated in all clinical situations.     eGFR's persistently <90 mL/min signify possible Chronic Kidney     Disease.  MAGNESIUM     Status: None   Collection Time    12/05/13  5:30 AM      Result Value Ref Range   Magnesium 1.8  1.5 - 2.5 mg/dL  PHOSPHORUS     Status: None   Collection Time    12/05/13  5:30 AM      Result Value Ref Range   Phosphorus 3.2  2.3 - 4.6 mg/dL  GLUCOSE, CAPILLARY     Status: Abnormal   Collection Time    12/05/13  7:49 AM      Result Value Ref Range   Glucose-Capillary 123 (*) 70 - 99 mg/dL   Comment 1 Notify RN    CBC     Status: Abnormal   Collection Time    12/05/13  8:11 AM      Result Value Ref Range   WBC 12.1 (*) 4.0 - 10.5 K/uL   RBC  3.61 (*) 3.87 - 5.11 MIL/uL   Hemoglobin 10.6 (*) 12.0 - 15.0 g/dL   HCT 32.8 (*) 36.0 - 46.0 %   MCV 90.9  78.0 - 100.0 fL   MCH 29.4  26.0 - 34.0 pg   MCHC 32.3  30.0 - 36.0 g/dL   RDW 14.3  11.5 - 15.5 %   Platelets 157  150 - 400 K/uL  GLUCOSE, CAPILLARY     Status: Abnormal   Collection Time    12/05/13 12:20 PM      Result Value Ref Range   Glucose-Capillary 111 (*) 70 - 99 mg/dL   Comment 1 Notify RN    GLUCOSE, CAPILLARY     Status: Abnormal   Collection Time    12/05/13  5:56 PM      Result Value Ref Range   Glucose-Capillary 121 (*) 70 - 99 mg/dL  GLUCOSE, CAPILLARY     Status: Abnormal   Collection Time    12/05/13 11:38 PM      Result Value Ref Range   Glucose-Capillary 123 (*) 70 - 99 mg/dL  GLUCOSE, CAPILLARY     Status: Abnormal   Collection Time    12/06/13  5:27 AM      Result Value Ref Range   Glucose-Capillary 120 (*) 70 - 99 mg/dL    Imaging / Studies: No results found.  Medications / Allergies: per chart  Antibiotics: Anti-infectives   Start     Dose/Rate Route Frequency Ordered Stop   11/28/13 0000  piperacillin-tazobactam (ZOSYN) IVPB 3.375 g  Status:  Discontinued     3.375 g 12.5 mL/hr over 240 Minutes Intravenous Every 8 hours 11/27/13 1623 12/05/13 1234   11/27/13 1500  piperacillin-tazobactam (ZOSYN) IVPB 3.375 g     3.375 g 100 mL/hr over 30  Minutes Intravenous  Once 11/27/13 1443 11/27/13 1445   11/25/13 2359  [MAR Hold]  ceFEPIme (MAXIPIME) 1 g in dextrose 5 % 50 mL IVPB  Status:  Discontinued     (On MAR Hold since 11/27/13 1345)   1 g 100 mL/hr over 30 Minutes Intravenous Every 24 hours 11/25/13 2300 11/27/13 1633   11/24/13 1800  ampicillin-sulbactam (UNASYN) 1.5 g in sodium chloride 0.9 % 50 mL IVPB  Status:  Discontinued     1.5 g 100 mL/hr over 30 Minutes Intravenous Every 12 hours 11/24/13 1549 11/25/13 0956      Assessment/Plan  DIAGNOSTIC LAPAROSCOPY, OPEN RESECTION OF SMALL BOWEL - 11/27/2013 - Dr. Alphonsa Overall - for  closed loop small bowel obstruction with perforation  POD #9  -Zosyn - started 2/18-2/27 -clear liquid diet, advance slowly as tolerated -PT evaluation to help with mobility  -TPN  DVT prophylaxis - Lovenox  Dementia / Short term memory loss  History of stroke - history of right frontal stroke with left-sided hemiparesis resulting in gait instability  HTN-per primary team  Erby Pian, [facility] Pager [phone] Office [phone]  12/06/2013 9:39 AM

## 2013-12-07 LAB — CBC
HCT: 31.2 % — ABNORMAL LOW (ref 36.0–46.0)
HEMOGLOBIN: 9.9 g/dL — AB (ref 12.0–15.0)
MCH: 29.2 pg (ref 26.0–34.0)
MCHC: 31.7 g/dL (ref 30.0–36.0)
MCV: 92 fL (ref 78.0–100.0)
Platelets: 144 10*3/uL — ABNORMAL LOW (ref 150–400)
RBC: 3.39 MIL/uL — ABNORMAL LOW (ref 3.87–5.11)
RDW: 14.5 % (ref 11.5–15.5)
WBC: 12 10*3/uL — AB (ref 4.0–10.5)

## 2013-12-07 LAB — COMPREHENSIVE METABOLIC PANEL
ALK PHOS: 50 U/L (ref 39–117)
ALT: 14 U/L (ref 0–35)
AST: 21 U/L (ref 0–37)
Albumin: 2.4 g/dL — ABNORMAL LOW (ref 3.5–5.2)
BILIRUBIN TOTAL: 0.3 mg/dL (ref 0.3–1.2)
BUN: 20 mg/dL (ref 6–23)
CHLORIDE: 108 meq/L (ref 96–112)
CO2: 24 mEq/L (ref 19–32)
Calcium: 8.6 mg/dL (ref 8.4–10.5)
Creatinine, Ser: 0.7 mg/dL (ref 0.50–1.10)
GFR calc non Af Amer: 75 mL/min — ABNORMAL LOW (ref >90)
GFR, EST AFRICAN AMERICAN: 86 mL/min — AB (ref >90)
GLUCOSE: 117 mg/dL — AB (ref 70–99)
POTASSIUM: 4.1 meq/L (ref 3.7–5.3)
Sodium: 142 mEq/L (ref 137–147)
Total Protein: 5.9 g/dL — ABNORMAL LOW (ref 6.0–8.3)

## 2013-12-07 LAB — GLUCOSE, CAPILLARY
GLUCOSE-CAPILLARY: 110 mg/dL — AB (ref 70–99)
GLUCOSE-CAPILLARY: 115 mg/dL — AB (ref 70–99)
Glucose-Capillary: 124 mg/dL — ABNORMAL HIGH (ref 70–99)
Glucose-Capillary: 134 mg/dL — ABNORMAL HIGH (ref 70–99)

## 2013-12-07 LAB — HEMOGLOBIN AND HEMATOCRIT, BLOOD
HEMATOCRIT: 29.2 % — AB (ref 36.0–46.0)
Hemoglobin: 9.3 g/dL — ABNORMAL LOW (ref 12.0–15.0)

## 2013-12-07 LAB — MRSA PCR SCREENING: MRSA by PCR: NEGATIVE

## 2013-12-07 MED ORDER — M.V.I. ADULT IV INJ
INJECTION | INTRAVENOUS | Status: AC
  Administered 2013-12-07: 18:00:00 via INTRAVENOUS
  Filled 2013-12-07: qty 2000

## 2013-12-07 MED ORDER — FAT EMULSION 20 % IV EMUL
240.0000 mL | INTRAVENOUS | Status: AC
  Administered 2013-12-07: 240 mL via INTRAVENOUS
  Filled 2013-12-07: qty 250

## 2013-12-07 NOTE — Progress Notes (Signed)
Pt threw up what looked like most of lunch. Will decrease diet to clear.

## 2013-12-07 NOTE — Progress Notes (Signed)
Subjective: No complaints.  Did not recall having surgery and asked me what the bandage on her abdomen was there for.  Objective: Vital signs in last 24 hours: Temp:  [97.9 F (36.6 C)-98 F (36.7 C)] 98 F (36.7 C) (02/28 0500) Pulse Rate:  [86-94] 86 (02/28 0500) Resp:  [16-18] 18 (02/28 0500) BP: (114-137)/(74-77) 114/74 mmHg (02/28 0500) SpO2:  [95 %-96 %] 96 % (02/28 0500) Weight change:  Last BM Date: 12/05/13  Intake/Output from previous day: 02/27 0701 - 02/28 0700 In: 1400 [I.V.:1400] Out: 1750 [Urine:1750] Intake/Output this shift: Total I/O In: -  Out: 450 [Urine:450]  General appearance: alert, cooperative and appears stated age Resp: clear to auscultation bilaterally Cardio: regular rate and rhythm, S1, S2 normal, no murmur, click, rub or gallop GI: soft, non-tender; bowel sounds normal; no masses,  no organomegaly Extremities: extremities normal, atraumatic, no cyanosis or edema Neurologic: Grossly normal demented  Lab Results:  Recent Labs  12/05/13 0811 12/07/13 0435  WBC 12.1* 12.0*  HGB 10.6* 9.9*  HCT 32.8* 31.2*  PLT 157 144*   BMET  Recent Labs  12/05/13 0530 12/07/13 0435  NA 139 142  K 4.1 4.1  CL 104 108  CO2 25 24  GLUCOSE 127* 117*  BUN 17 20  CREATININE 0.79 0.70  CALCIUM 8.5 8.6   CMET CMP     Component Value Date/Time   NA 142 12/07/2013 0435   K 4.1 12/07/2013 0435   CL 108 12/07/2013 0435   CO2 24 12/07/2013 0435   GLUCOSE 117* 12/07/2013 0435   BUN 20 12/07/2013 0435   CREATININE 0.70 12/07/2013 0435   CALCIUM 8.6 12/07/2013 0435   PROT 5.9* 12/07/2013 0435   ALBUMIN 2.4* 12/07/2013 0435   AST 21 12/07/2013 0435   ALT 14 12/07/2013 0435   ALKPHOS 50 12/07/2013 0435   BILITOT 0.3 12/07/2013 0435   GFRNONAA 75* 12/07/2013 0435   GFRAA 86* 12/07/2013 0435     Studies/Results: No results found.  Medications: I have reviewed the patient's current medications.  Marland Kitchen. antiseptic oral rinse  15 mL Mouth Rinse q12n4p  .  aspirin  325 mg Oral Daily  . chlorhexidine  15 mL Mouth Rinse BID  . enoxaparin (LOVENOX) injection  40 mg Subcutaneous Q24H  . insulin aspart  0-9 Units Subcutaneous 4 times per day  . pantoprazole (PROTONIX) IV  40 mg Intravenous Q24H     Assessment/Plan:  Principal Problem:   SBO (small bowel obstruction) s/p SB resection-she is recovering.  Diet is being slowly advanced. Still on tna. Active Problems:   History of CVA (cerebrovascular accident)-follow.   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 Follow.   Acute on chronic renal failure-improved.   Hemiparesis affecting left side as late effect of cerebrovascular accident-follow. Still needs a few more days in the hospital. i suspect may need some sort of rehab/snf stay after acute hospitalization to regain strength and to continue her recovery.   LOS: 13 days   Ezequiel KayserPERINI,Billye Pickerel A, MD 12/07/2013, 1:34 PM

## 2013-12-07 NOTE — Progress Notes (Signed)
PARENTERAL NUTRITION CONSULT NOTE - FOLLOW UP  Pharmacy Consult for TNA Indication: SBO  Allergies  Allergen Reactions  . Sulfa Antibiotics Other (See Comments)    unknown    Patient Measurements: Height: 5' 5"  (165.1 cm) Weight: 118 lb 3.2 oz (53.615 kg) IBW/kg (Calculated) : 57 Usual Weight: 55.5 kg  Vital Signs: Temp: 98 F (36.7 C) (02/28 0500) Temp src: Oral (02/28 0500) BP: 114/74 mmHg (02/28 0500) Pulse Rate: 86 (02/28 0500) Intake/Output from previous day: 02/27 0701 - 02/28 0700 In: 1400 [I.V.:1400] Out: 1750 [Urine:1750]  Labs:  Recent Labs  12/05/13 0811 12/07/13 0435  WBC 12.1* 12.0*  HGB 10.6* 9.9*  HCT 32.8* 31.2*  PLT 157 144*     Recent Labs  12/05/13 0530 12/07/13 0435  NA 139 142  K 4.1 4.1  CL 104 108  CO2 25 24  GLUCOSE 127* 117*  BUN 17 20  CREATININE 0.79 0.70  CALCIUM 8.5 8.6  MG 1.8  --   PHOS 3.2  --   PROT 5.6* 5.9*  ALBUMIN 2.3* 2.4*  AST 23 21  ALT 16 14  ALKPHOS 46 50  BILITOT 0.4 0.3   Estimated Creatinine Clearance: 40.3 ml/min (by C-G formula based on Cr of 0.7).    Recent Labs  12/06/13 0527 12/06/13 1159 12/06/13 1814  GLUCAP 120* 122* 114*    CBGs & Insulin requirements past 24 hours:  - CBGs < 150, required 1 unit Novolog SSI  Assessment:  10 yoF with history of stroke resulting in left-sided hemiparesis and gait instability, hx dementia, severe scoliosis, CKD-III, presents with anorexia and N/V on 2/15, found to have SBO with perforation. Patient underwent laparotomy with small bowel resection 2/18. Given delayed recovery of bowel function, pharmacy consulted to begin TNA 2/24.   2/27: Tolerating TNA. NGT output decreased, now clamped, no N/V noted, passing flatus and multiple BM 2/26. WBC trending down and patient improving.  Continue TNA until tolerating PO's 2/28: advancing to FLD, tolerated sips of clears. WBC remains slightly elevated, to continue on abx. Passing flatus, hypoactive BS, passing  watery brown stool. NGT removed 2/27  Nutritional Goals:  - RD recs 2/23: 1700-1900 KCal, 75-90g protein per day - Clinimix E 5/20 @ 65 + IVF 20% at 10 ml/hr will provide 1853 Kcal and 78 g protein per day.    Current nutrition:  - Diet: NPO except ice cips - TNA: Clinimix E 5/20 @ 65 ml/hr - mIVF: D5 1/2 NS + 20 mEq Kcl per L @ 35 ml/hr   Labs: Electrolytes: lytes wnl, Mag and Phos WNL on 2/26 Renal Function: Scr wnl/stable Hepatic Function: AST/ALT, alk phos wnl Pre-Albumin: 12.7 (2/25) Triglycerides: 125 (2/25) CBGs: controlled on sensitive SSI   TNA day#5 TNA access: PICC placed 2/24  Plan:    Continue Clinimix E 5/20 at goal of 65 ml/hr  TNA to contain IV fat emulsion 20% at 10 ml/hr daily  Standard multivitamins daily  Trace elements only on MWF due to national backorder  Continue IV fluid at 35 ml/hr  Change sensitive SSI to 4 times daily  TNA labs Monday/Thursdays  F/u diet advancement/toleration  Thank you for the consult.  Johny Drilling, PharmD, BCPS Pager: [phone] Pharmacy: [phone] 12/07/2013 9:53 AM

## 2013-12-07 NOTE — Progress Notes (Addendum)
Patient ID: Lindsey CarnesRuth S Hensley, female   DOB: 1922/07/26, 80 y.o.   MRN: [id number] 10 Days Post-Op  Subjective: Mild baseline confusion but knows she had surgery. Denies abdominal pain or nausea. States she has been drinking some water without difficulty.  Objective: Vital signs in last 24 hours: Temp:  [97.9 F (36.6 C)-98.1 F (36.7 C)] 98 F (36.7 C) (02/28 0500) Pulse Rate:  [86-98] 86 (02/28 0500) Resp:  [16-18] 18 (02/28 0500) BP: (106-137)/(71-77) 114/74 mmHg (02/28 0500) SpO2:  [94 %-96 %] 96 % (02/28 0500) Weight:  [118 lb 3.2 oz (53.615 kg)] 118 lb 3.2 oz (53.615 kg) (02/27 1053) Last BM Date: 12/05/13  Intake/Output from previous day: 02/27 0701 - 02/28 0700 In: 1400 [I.V.:1400] Out: 1750 [Urine:1750] Intake/Output this shift: Total I/O In: -  Out: 150 [Urine:150]  General appearance: alert, cooperative and no distress GI: normal findings: soft, non-tender and bowel sounds present, nondistended Incision/Wound: packed open, clean and no erythema  Lab Results:   Recent Labs  12/05/13 0811 12/07/13 0435  WBC 12.1* 12.0*  HGB 10.6* 9.9*  HCT 32.8* 31.2*  PLT 157 144*   BMET  Recent Labs  12/05/13 0530 12/07/13 0435  NA 139 142  K 4.1 4.1  CL 104 108  CO2 25 24  GLUCOSE 127* 117*  BUN 17 20  CREATININE 0.79 0.70  CALCIUM 8.5 8.6     Studies/Results: No results found.  Anti-infectives: Anti-infectives   Start     Dose/Rate Route Frequency Ordered Stop   11/28/13 0000  piperacillin-tazobactam (ZOSYN) IVPB 3.375 g  Status:  Discontinued     3.375 g 12.5 mL/hr over 240 Minutes Intravenous Every 8 hours 11/27/13 1623 12/05/13 1234   11/27/13 1500  piperacillin-tazobactam (ZOSYN) IVPB 3.375 g     3.375 g 100 mL/hr over 30 Minutes Intravenous  Once 11/27/13 1443 11/27/13 1445   11/25/13 2359  [MAR Hold]  ceFEPIme (MAXIPIME) 1 g in dextrose 5 % 50 mL IVPB  Status:  Discontinued     (On MAR Hold since 11/27/13 1345)   1 g 100 mL/hr over 30 Minutes  Intravenous Every 24 hours 11/25/13 2300 11/27/13 1633   11/24/13 1800  ampicillin-sulbactam (UNASYN) 1.5 g in sodium chloride 0.9 % 50 mL IVPB  Status:  Discontinued     1.5 g 100 mL/hr over 30 Minutes Intravenous Every 12 hours 11/24/13 1549 11/25/13 0956      Assessment/Plan: s/p Procedure(s): DIAGNOSTIC LAPAROSCOPY OPEN RESECTION OF SMALL BOWEL Making good progress without apparent complication. White count still mildly elevated, continue antibiotics for now, check CBC in a.m. Advanced to full liquid diet On TNA    LOS: 13 days    Aariana Shankland T 12/07/2013

## 2013-12-07 NOTE — Progress Notes (Signed)
Occupational Therapy Treatment Patient Details Name: Lindsey Hensley MRN: [id number] DOB: 1921/12/20 Today's Date: 12/07/2013 Time: [id number] OT Time Calculation (min): 20 min  OT Assessment / Plan / Recommendation  History of present illness pt is s/p exploratory lap   she has a h/o CVA, dementia, L THA 6 months ago   OT comments  Pt limited this session by nausea and dizziness so returned to supine after only standing and taking a few steps. Pt had just had vomiting episode shortly before OT arrived but she was agreeable to try to get up. Will continue to work with pt as tolerated to progress ADL independence.    Follow Up Recommendations  Supervision/Assistance - 24 hour    Barriers to Discharge       Equipment Recommendations  3 in 1 bedside comode    Recommendations for Other Services    Frequency Min 2X/week   Progress towards OT Goals Progress towards OT goals: Not progressing toward goals - comment (nausea and dizziness this session. pt willing to try but nausea limited)  Plan Discharge plan remains appropriate    Precautions / Restrictions Precautions Precautions: Fall Precaution Comments: dementia with short term memory loss Restrictions Weight Bearing Restrictions: No   Pertinent Vitals/Pain No complaint of pain    ADL  Toilet Transfer: Simulated;Minimal assistance Toilet Transfer Method: Sit to stand Equipment Used: Rolling walker ADL Comments: Per chart pt had vomitted most of lunch meal. Per nursing ok to try to work with pt for OT. Pt agreeable to get up to the bathroom but when she sat on EOB became dizzy. Dizziness improved some with time and pt attemptd to stand. She took a few steps with walker away from the bed toward the bathroom but started to feel nauseous and as if she may vomit. Returned pt to bed. Informed nursing. Very limited session.     OT Diagnosis:    OT Problem List:   OT Treatment Interventions:     OT Goals(current goals can now be found  in the care plan section)    Visit Information  Last OT Received On: 12/07/13 Assistance Needed: +1 History of Present Illness: pt is s/p exploratory lap   she has a h/o CVA, dementia, L THA 6 months ago    Subjective Data      Prior Functioning       Cognition  Cognition Arousal/Alertness: Awake/alert Behavior During Therapy: WFL for tasks assessed/performed Overall Cognitive Status: History of cognitive impairments - at baseline    Mobility  Bed Mobility Bed Mobility: Supine to Sit Supine to sit: Min guard;HOB elevated Transfers Overall transfer level: Needs assistance Equipment used: Rolling walker (2 wheeled) Transfers: Sit to/from Stand Sit to Stand: Min assist General transfer comment: pt with some unsteadiness noted. Min assist to help fully stand and balance. verbal cues for hand placement.    Exercises      Balance    End of Session OT - End of Session Equipment Utilized During Treatment: Rolling walker Activity Tolerance: Other (comment) (nausea, dizziness) Patient left: in bed;with call bell/phone within reach;with bed alarm set  GO     Lennox LaityStone, [name] [phone] 12/07/2013, 4:04 PM

## 2013-12-08 LAB — CBC
HCT: 31.2 % — ABNORMAL LOW (ref 36.0–46.0)
HEMOGLOBIN: 10 g/dL — AB (ref 12.0–15.0)
MCH: 29.5 pg (ref 26.0–34.0)
MCHC: 32.1 g/dL (ref 30.0–36.0)
MCV: 92 fL (ref 78.0–100.0)
PLATELETS: 170 10*3/uL (ref 150–400)
RBC: 3.39 MIL/uL — ABNORMAL LOW (ref 3.87–5.11)
RDW: 14.6 % (ref 11.5–15.5)
WBC: 15.1 10*3/uL — AB (ref 4.0–10.5)

## 2013-12-08 LAB — GLUCOSE, CAPILLARY
GLUCOSE-CAPILLARY: 104 mg/dL — AB (ref 70–99)
Glucose-Capillary: 111 mg/dL — ABNORMAL HIGH (ref 70–99)
Glucose-Capillary: 110 mg/dL — ABNORMAL HIGH (ref 70–99)
Glucose-Capillary: 114 mg/dL — ABNORMAL HIGH (ref 70–99)

## 2013-12-08 MED ORDER — M.V.I. ADULT IV INJ
INJECTION | INTRAVENOUS | Status: AC
  Administered 2013-12-08: 17:00:00 via INTRAVENOUS
  Filled 2013-12-08: qty 2000

## 2013-12-08 MED ORDER — FAT EMULSION 20 % IV EMUL
240.0000 mL | INTRAVENOUS | Status: AC
  Administered 2013-12-08: 240 mL via INTRAVENOUS
  Filled 2013-12-08 (×2): qty 250

## 2013-12-08 NOTE — Progress Notes (Signed)
PARENTERAL NUTRITION CONSULT NOTE - FOLLOW UP  Pharmacy Consult for TNA Indication: SBO  Allergies  Allergen Reactions  . Sulfa Antibiotics Other (See Comments)    unknown    Patient Measurements: Height: _0  (165.1 cm) Weight: 124 lb 12.5 oz (56.6 kg) IBW/kg (Calculated) : 57 Usual Weight: 55.5 kg  Vital Signs: Temp: 98.2 F (36.8 C) (03/01 0457) Temp src: Oral (03/01 0457) BP: 126/64 mmHg (03/01 0457) Pulse Rate: 94 (03/01 0457) Intake/Output from previous day: 02/28 0701 - 03/01 0700 In: 2585 [I.V.:822.5; TPN:1762.5] Out: 2550 [Urine:2550]  Labs:  Recent Labs  12/07/13 0435 12/07/13 2045 12/08/13 0312  WBC 12.0*  --  15.1*  HGB 9.9* 9.3* 10.0*  HCT 31.2* 29.2* 31.2*  PLT 144*  --  170     Recent Labs  12/07/13 0435  NA 142  K 4.1  CL 108  CO2 24  GLUCOSE 117*  BUN 20  CREATININE 0.70  CALCIUM 8.6  PROT 5.9*  ALBUMIN 2.4*  AST 21  ALT 14  ALKPHOS 50  BILITOT 0.3   Estimated Creatinine Clearance: 42.6 ml/min (by C-G formula based on Cr of 0.7).    Recent Labs  12/07/13 1832 12/07/13 2342 12/08/13 0552  GLUCAP 110* 111* 104*    CBGs & Insulin requirements past 24 hours:  - CBGs < 150, required 0 units Novolog SSI  Assessment:  36 yoF with history of stroke resulting in left-sided hemiparesis and gait instability, hx dementia, severe scoliosis, CKD-III, presents with anorexia and N/V on 2/15, found to have SBO with perforation. Patient underwent laparotomy with small bowel resection 2/18. Given delayed recovery of bowel function, pharmacy consulted to begin TNA 2/24.   2/27: Tolerating TNA. NGT output decreased, now clamped, no N/V noted, passing flatus and multiple BM 2/26. WBC trending down and patient improving.  Continue TNA until tolerating PO's 2/28: advancing to FLD, tolerated sips of clears. WBC remains slightly elevated. Passing flatus, hypoactive BS, passing watery brown stool. NGT removed 2/27 3/1: back to sips only, no  flatus/BS, now w 1 instance of bloody stool, WBC increased  Nutritional Goals:  - RD recs 2/23: 1700-1900 KCal, 75-90g protein per day - Clinimix E 5/20 @ 65 + IVF 20% at 10 ml/hr will provide 1853 Kcal and 78 g protein per day.    Current nutrition:  - Diet: failed trial of FLD, back to sips with meds only - TNA: Clinimix E 5/20 @ 65 ml/hr - mIVF: D5 1/2 NS + 20 mEq Kcl per L @ 35 ml/hr   Labs: Electrolytes: lytes wnl on 2/28, Mag and Phos WNL on 2/26 Renal Function: Scr wnl/stable Hepatic Function: AST/ALT, alk phos wnl Pre-Albumin: 12.7 (2/25) Triglycerides: 125 (2/25) CBGs: controlled on sensitive SSI   TNA day#6 TNA access: PICC placed 2/24  Plan:    Continue Clinimix E 5/20 at goal of 65 ml/hr  TNA to contain IV fat emulsion 20% at 10 ml/hr daily  Standard multivitamins daily  Trace elements only on MWF due to national backorder  Continue IV fluid at 35 ml/hr  Continue sensitive SSI to 4 times daily  TNA labs Monday/Thursdays  F/u diet advancement/toleration  Thank you for the consult.  Johny Drilling, PharmD, BCPS Pager: [phone] Pharmacy: [phone] 12/08/2013 9:10 AM

## 2013-12-08 NOTE — Progress Notes (Signed)
Patient ID: Lindsey Hensley, female   DOB: Jun 23, 1922, 80 y.o.   MRN: [id number] 11 Days Post-Op  Subjective: Patient denies pain or nausea this morning. She had a dark bloody bowel movement with clots last night but subsequently have a more normal bowel movement without significant blood.  Objective: Vital signs in last 24 hours: Temp:  [98.1 F (36.7 C)-98.5 F (36.9 C)] 98.2 F (36.8 C) (03/01 0457) Pulse Rate:  [84-94] 94 (03/01 0457) Resp:  [16-18] 16 (03/01 0457) BP: (126-141)/(60-72) 126/64 mmHg (03/01 0457) SpO2:  [97 %-98 %] 97 % (03/01 0457) Weight:  [124 lb 12.5 oz (56.6 kg)] 124 lb 12.5 oz (56.6 kg) (03/01 0410) Last BM Date: 12/05/13  Intake/Output from previous day: 02/28 0701 - 03/01 0700 In: 2585 [I.V.:822.5; TPN:1762.5] Out: 2550 [Urine:2550] Intake/Output this shift:    General appearance: alert, cooperative and no distress GI: normal findings: soft, non-tender and nondistended Incision/Wound: clean granulation without drainage  Lab Results:   Recent Labs  12/07/13 0435 12/07/13 2045 12/08/13 0312  WBC 12.0*  --  15.1*  HGB 9.9* 9.3* 10.0*  HCT 31.2* 29.2* 31.2*  PLT 144*  --  170   BMET  Recent Labs  12/07/13 0435  NA 142  K 4.1  CL 108  CO2 24  GLUCOSE 117*  BUN 20  CREATININE 0.70  CALCIUM 8.6     Studies/Results: No results found.  Anti-infectives: Anti-infectives   Start     Dose/Rate Route Frequency Ordered Stop   11/28/13 0000  piperacillin-tazobactam (ZOSYN) IVPB 3.375 g  Status:  Discontinued     3.375 g 12.5 mL/hr over 240 Minutes Intravenous Every 8 hours 11/27/13 1623 12/05/13 1234   11/27/13 1500  piperacillin-tazobactam (ZOSYN) IVPB 3.375 g     3.375 g 100 mL/hr over 30 Minutes Intravenous  Once 11/27/13 1443 11/27/13 1445   11/25/13 2359  [MAR Hold]  ceFEPIme (MAXIPIME) 1 g in dextrose 5 % 50 mL IVPB  Status:  Discontinued     (On MAR Hold since 11/27/13 1345)   1 g 100 mL/hr over 30 Minutes Intravenous Every 24  hours 11/25/13 2300 11/27/13 1633   11/24/13 1800  ampicillin-sulbactam (UNASYN) 1.5 g in sodium chloride 0.9 % 50 mL IVPB  Status:  Discontinued     1.5 g 100 mL/hr over 30 Minutes Intravenous Every 12 hours 11/24/13 1549 11/25/13 0956      Assessment/Plan: s/p Procedure(s): DIAGNOSTIC LAPAROSCOPY OPEN RESECTION OF SMALL BOWEL Overall I think she is doing well. She had one bloody bowel movement of uncertain etiology but no further bleeding and her hemoglobin is stable. They have been some minor bleeding from her anastomosis or old blood from the surgery. White blood count is somewhat elevated. She is no longer on antibiotics. I see no apparent source of infection in her abdomen her wound clinically. Observe for now and repeat white count tomorrow. Restart full liquid diet.   LOS: 14 days    Kohan Azizi T 12/08/2013

## 2013-12-08 NOTE — Progress Notes (Signed)
Subjective: One episode of gross blood from rectum / bloody bm last p.m.  Just had a bm now which is loose but no blood now. No new complaints. Appreciate surgery help. Diet restarted.  Objective: Vital signs in last 24 hours: Temp:  [98.1 F (36.7 C)-98.3 F (36.8 C)] 98.2 F (36.8 C) (03/01 0457) Pulse Rate:  [84-94] 94 (03/01 0457) Resp:  [16-18] 16 (03/01 0457) BP: (126-141)/(60-65) 126/64 mmHg (03/01 0457) SpO2:  [97 %-98 %] 97 % (03/01 0457) Weight:  [56.6 kg (124 lb 12.5 oz)] 56.6 kg (124 lb 12.5 oz) (03/01 0410) Weight change: 2.985 kg (6 lb 9.3 oz) Last BM Date: 12/05/13  Intake/Output from previous day: 02/28 0701 - 03/01 0700 In: 2585 [I.V.:822.5; TPN:1762.5] Out: 2550 [Urine:2550] Intake/Output this shift:    General appearance: alert and cooperative Resp: clear to auscultation bilaterally Cardio: regular rate and rhythm, S1, S2 normal, no murmur, click, rub or gallop GI: soft, non-tender; bowel sounds normal; no masses,  no organomegaly Extremities: extremities normal, atraumatic, no cyanosis or edema Neurologic: Grossly normal demented  Lab Results:  Recent Labs  12/07/13 0435 12/07/13 2045 12/08/13 0312  WBC 12.0*  --  15.1*  HGB 9.9* 9.3* 10.0*  HCT 31.2* 29.2* 31.2*  PLT 144*  --  170   BMET  Recent Labs  12/07/13 0435  NA 142  K 4.1  CL 108  CO2 24  GLUCOSE 117*  BUN 20  CREATININE 0.70  CALCIUM 8.6   CMET CMP     Component Value Date/Time   NA 142 12/07/2013 0435   K 4.1 12/07/2013 0435   CL 108 12/07/2013 0435   CO2 24 12/07/2013 0435   GLUCOSE 117* 12/07/2013 0435   BUN 20 12/07/2013 0435   CREATININE 0.70 12/07/2013 0435   CALCIUM 8.6 12/07/2013 0435   PROT 5.9* 12/07/2013 0435   ALBUMIN 2.4* 12/07/2013 0435   AST 21 12/07/2013 0435   ALT 14 12/07/2013 0435   ALKPHOS 50 12/07/2013 0435   BILITOT 0.3 12/07/2013 0435   GFRNONAA 75* 12/07/2013 0435   GFRAA 86* 12/07/2013 0435     Studies/Results: No results found.  Medications: I  have reviewed the patient's current medications.  Marland Kitchen. antiseptic oral rinse  15 mL Mouth Rinse q12n4p  . aspirin  325 mg Oral Daily  . chlorhexidine  15 mL Mouth Rinse BID  . insulin aspart  0-9 Units Subcutaneous 4 times per day  . pantoprazole (PROTONIX) IV  40 mg Intravenous Q24H     Assessment/Plan:  Principal Problem:   SBO (small bowel obstruction) s/p SB resection-improving. This episode of bloody stool from yesterday p.m. Is concerning but no recurrence today so far.  I stopped her lovenox. On scd's now for dvt proph.  Continue tna and attempts to advance diet. Active Problems:   History of CVA (cerebrovascular accident)-follow   Leukocytosis-15 now. 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 Stable.   Acute on chronic renal failure-improved   Hemiparesis affecting left side as late effect of cerebrovascular accident-follow. DisP:  Continue care. i suspect will need a snf/rehab type of stay eventually.    LOS: 14 days   Ezequiel KayserPERINI,Tatyana Biber A, MD 12/08/2013, 2:26 PM

## 2013-12-09 ENCOUNTER — Inpatient Hospital Stay (HOSPITAL_COMMUNITY): Payer: Medicare HMO

## 2013-12-09 DIAGNOSIS — E46 Unspecified protein-calorie malnutrition: Secondary | ICD-10-CM

## 2013-12-09 DIAGNOSIS — K625 Hemorrhage of anus and rectum: Secondary | ICD-10-CM | POA: Diagnosis not present

## 2013-12-09 LAB — CBC
HCT: 28.4 % — ABNORMAL LOW (ref 36.0–46.0)
HEMOGLOBIN: 8.9 g/dL — AB (ref 12.0–15.0)
MCH: 29.1 pg (ref 26.0–34.0)
MCHC: 31.3 g/dL (ref 30.0–36.0)
MCV: 92.8 fL (ref 78.0–100.0)
Platelets: 158 10*3/uL (ref 150–400)
RBC: 3.06 MIL/uL — AB (ref 3.87–5.11)
RDW: 14.7 % (ref 11.5–15.5)
WBC: 12.2 10*3/uL — AB (ref 4.0–10.5)

## 2013-12-09 LAB — TRIGLYCERIDES: Triglycerides: 122 mg/dL (ref <150)

## 2013-12-09 LAB — COMPREHENSIVE METABOLIC PANEL
ALBUMIN: 2.4 g/dL — AB (ref 3.5–5.2)
ALT: 11 U/L (ref 0–35)
AST: 17 U/L (ref 0–37)
Alkaline Phosphatase: 48 U/L (ref 39–117)
BUN: 21 mg/dL (ref 6–23)
CALCIUM: 8.8 mg/dL (ref 8.4–10.5)
CO2: 25 mEq/L (ref 19–32)
Chloride: 108 mEq/L (ref 96–112)
Creatinine, Ser: 0.71 mg/dL (ref 0.50–1.10)
GFR calc non Af Amer: 74 mL/min — ABNORMAL LOW (ref >90)
GFR, EST AFRICAN AMERICAN: 86 mL/min — AB (ref >90)
Glucose, Bld: 114 mg/dL — ABNORMAL HIGH (ref 70–99)
Potassium: 4.1 mEq/L (ref 3.7–5.3)
Sodium: 143 mEq/L (ref 137–147)
TOTAL PROTEIN: 5.9 g/dL — AB (ref 6.0–8.3)
Total Bilirubin: <0.2 mg/dL — ABNORMAL LOW (ref 0.3–1.2)

## 2013-12-09 LAB — GLUCOSE, CAPILLARY
GLUCOSE-CAPILLARY: 106 mg/dL — AB (ref 70–99)
GLUCOSE-CAPILLARY: 115 mg/dL — AB (ref 70–99)
Glucose-Capillary: 109 mg/dL — ABNORMAL HIGH (ref 70–99)
Glucose-Capillary: 114 mg/dL — ABNORMAL HIGH (ref 70–99)
Glucose-Capillary: 115 mg/dL — ABNORMAL HIGH (ref 70–99)

## 2013-12-09 LAB — FERRITIN: FERRITIN: 126 ng/mL (ref 10–291)

## 2013-12-09 LAB — RETICULOCYTES
RBC.: 3.22 MIL/uL — ABNORMAL LOW (ref 3.87–5.11)
RETIC COUNT ABSOLUTE: 38.6 10*3/uL (ref 19.0–186.0)
Retic Ct Pct: 1.2 % (ref 0.4–3.1)

## 2013-12-09 LAB — DIFFERENTIAL
Basophils Absolute: 0 10*3/uL (ref 0.0–0.1)
Basophils Relative: 0 % (ref 0–1)
Eosinophils Absolute: 0.1 10*3/uL (ref 0.0–0.7)
Eosinophils Relative: 0 % (ref 0–5)
LYMPHS ABS: 1.6 10*3/uL (ref 0.7–4.0)
LYMPHS PCT: 13 % (ref 12–46)
Monocytes Absolute: 2.8 10*3/uL — ABNORMAL HIGH (ref 0.1–1.0)
Monocytes Relative: 23 % — ABNORMAL HIGH (ref 3–12)
NEUTROS PCT: 63 % (ref 43–77)
Neutro Abs: 7.7 10*3/uL (ref 1.7–7.7)

## 2013-12-09 LAB — VITAMIN B12: Vitamin B-12: 1093 pg/mL — ABNORMAL HIGH (ref 211–911)

## 2013-12-09 LAB — IRON AND TIBC
Iron: 41 ug/dL — ABNORMAL LOW (ref 42–135)
Saturation Ratios: 15 % — ABNORMAL LOW (ref 20–55)
TIBC: 274 ug/dL (ref 250–470)
UIBC: 233 ug/dL (ref 125–400)

## 2013-12-09 LAB — FOLATE: FOLATE: 8.6 ng/mL

## 2013-12-09 LAB — MAGNESIUM: MAGNESIUM: 1.9 mg/dL (ref 1.5–2.5)

## 2013-12-09 LAB — PREALBUMIN: PREALBUMIN: 18.3 mg/dL (ref 17.0–34.0)

## 2013-12-09 LAB — PHOSPHORUS: Phosphorus: 3.6 mg/dL (ref 2.3–4.6)

## 2013-12-09 MED ORDER — TRACE MINERALS CR-CU-F-FE-I-MN-MO-SE-ZN IV SOLN
INTRAVENOUS | Status: AC
  Administered 2013-12-09: 18:00:00 via INTRAVENOUS
  Filled 2013-12-09: qty 2000

## 2013-12-09 MED ORDER — ENSURE COMPLETE PO LIQD
237.0000 mL | Freq: Two times a day (BID) | ORAL | Status: DC
  Administered 2013-12-10 (×2): 237 mL via ORAL

## 2013-12-09 MED ORDER — FAT EMULSION 20 % IV EMUL
240.0000 mL | INTRAVENOUS | Status: AC
  Administered 2013-12-09: 240 mL via INTRAVENOUS
  Filled 2013-12-09: qty 250

## 2013-12-09 NOTE — Progress Notes (Signed)
PT Cancellation Note  Patient Details Name: Lindsey CarnesRuth S Hensley MRN: [id number] DOB: [birth year]   Cancelled Treatment:    Reason Eval/Treat Not Completed: Medical issues which prohibited therapy (pt vomiting upon entering room, just ate breakfast) Will check back as schedule permits.   Cleota Pellerito,KATHrine E 12/09/2013, 9:35 AM

## 2013-12-09 NOTE — Progress Notes (Signed)
CSW met with pt / son to assist with d/c planning. Pt is more willing to reconsider [facility] following hospital d/c. SNF bed offers provided to pt/son. [facility] contacted at son'e request. There may be an opening later this week  provided pt no longer needs TPN. CSW will continue to follow to assist with d/c planning.  Werner Lean LCSW [phone]

## 2013-12-09 NOTE — Progress Notes (Signed)
Nutrition Brief Note  48 hour calorie count ordered. Discussed with pt, family, and nursing. Envelope placed on door. Unsure how well pt will eat as she vomited multiple times this morning, 20 minutes after eating some oatmeal and coffee. Son agreeable to pt trying Ensure, will order. Will continue to monitor and analyze calorie count.   Levon HedgerHeather Baron MS, RD, LDN [phone] Pager [phone] After Hours Pager

## 2013-12-09 NOTE — Progress Notes (Signed)
Subjective: RN is unaware of any additional blood in stools.  1 BM recorded in past 24 hours.  Patient denies abdominal pain, nausea.  Remains on full liquid diet due to N/V and bloody stool over the weekend.  Objective: Vital signs in last 24 hours: Temp:  [97.9 F (36.6 C)-98.8 F (37.1 C)] 97.9 F (36.6 C) (03/02 0538) Pulse Rate:  [88-98] 88 (03/02 0538) Resp:  [16] 16 (03/02 0538) BP: (106-140)/(50-60) 131/56 mmHg (03/02 0538) SpO2:  [96 %-100 %] 100 % (03/02 0538) Weight:  [57.6 kg (126 lb 15.8 oz)] 57.6 kg (126 lb 15.8 oz) (03/02 0500) Weight change: 1 kg (2 lb 3.3 oz) Last BM Date: 12/08/13  CBG (last 3)   Recent Labs  12/08/13 1828 12/08/13 2325 12/09/13 0536  GLUCAP 114* 106* 115*    Intake/Output from previous day: 03/01 0701 - 03/02 0700 In: 1757.5 [I.V.:857.5; TPN:900] Out: 3275 [Urine:3275] Intake/Output this shift:    General appearance: alert, no distress and appears weaker, frail Eyes: no scleral icterus Throat: oropharynx moist without erythema Resp: clear to auscultation bilaterally Cardio: regular rate and rhythm GI: soft, non-tender; bowel sounds normal; no masses,  no organomegaly Extremities: no clubbing, cyanosis or edema; SCDs in place   Lab Results:  Recent Labs  12/07/13 0435 12/09/13 0342  NA 142 143  K 4.1 4.1  CL 108 108  CO2 24 25  GLUCOSE 117* 114*  BUN 20 21  CREATININE 0.70 0.71  CALCIUM 8.6 8.8  MG  --  1.9  PHOS  --  3.6    Recent Labs  12/07/13 0435 12/09/13 0342  AST 21 17  ALT 14 11  ALKPHOS 50 48  BILITOT 0.3 <0.2*  PROT 5.9* 5.9*  ALBUMIN 2.4* 2.4*    Recent Labs  12/08/13 0312 12/09/13 0342  WBC 15.1* 12.2*  NEUTROABS  --  7.7  HGB 10.0* 8.9*  HCT 31.2* 28.4*  MCV 92.0 92.8  PLT 170 158   Lab Results  Component Value Date   INR 1.10 02/25/2011    Studies/Results: No results found.   Medications: Scheduled: . antiseptic oral rinse  15 mL Mouth Rinse q12n4p  . aspirin  325 mg Oral  Daily  . chlorhexidine  15 mL Mouth Rinse BID  . insulin aspart  0-9 Units Subcutaneous 4 times per day  . pantoprazole (PROTONIX) IV  40 mg Intravenous Q24H   Continuous: . dextrose 5 % and 0.45 % NaCl with KCl 20 mEq/L 35 mL/hr at 12/08/13 1100  . Marland KitchenTPN (CLINIMIX-E) Adult 65 mL/hr at 12/08/13 1721   And  . fat emulsion 240 mL (12/08/13 1721)    Assessment/Plan: Principal Problem:  1. Small bowel obstruction with perforation s/p Ex Lap with bowel repair (POD #12). Slowing improving with +BMs and bowel sounds.  Will try to advance diet today (full liquids this am) as tolerated. Continue TNA until tolerating pos.  Active Problems:  2. Rectal Bleeding- isolated event may be related to slow bowel transit (?stercoral ulcer).  If no further bleeding will simply monitor.  If recurrent may need GI evaluation.  Lovenox held but remains on ASA due to history of CVA.   3. Anemia- monitor with recent rectal bleeding.  Will transfuse if Hg<8.0.  Check anemia panel and start iron if needed.  Likely mutifactorial secondary to bleeding, acute illness, poor nutritional status. 4. Severe protein calorie malnutrition-continue TNA until po intake improves.    5. Acute on Chronic Renal Failure- resolved 6. Dementia in  Alzheimer's disease with delirium- obvious progression. No behavioral issues.  7. History of CVA (cerebrovascular accident) with left hemiparesis- Aspirin restarted. BP reasonable (avoid hypotension due to prior CVA in setting of relative hypotension)  8. Disposition- anticipate discharge when tolerating diet in 2-3 days.  I believe she needs SNF rehab and will reinforce with her son to reconsider options.  She seems more agreeable at the moment.     LOS: 15 days   Martha ClanShaw, Jonanthan Bolender 12/09/2013, 7:46 AM

## 2013-12-09 NOTE — Progress Notes (Signed)
Patient ID: Lindsey Hensley, female   DOB: 1921-12-23, 80 y.o.   MRN: [id number]   Subjective: Denies n/v.  Having BMs.  H&h down a bit but stable.   Objective:  Vital signs:  Filed Vitals:   12/08/13 1400 12/08/13 2046 12/09/13 0500 12/09/13 0538  BP: 106/50 140/60  131/56  Pulse: 89 98  88  Temp: 98.8 F (37.1 C) 98.3 F (36.8 C)  97.9 F (36.6 C)  TempSrc: Oral Oral  Oral  Resp: 16 16  16   Height:      Weight:   126 lb 15.8 oz (57.6 kg)   SpO2: 96% 98%  100%    Last BM Date: 12/08/13  Intake/Output   Yesterday:  03/01 0701 - 03/02 0700 In: 1757.5 [I.V.:857.5; TPN:900] Out: 3275 [Urine:3275] This shift:  Total I/O In: 120 [P.O.:120] Out: 400 [Urine:400]  Physical Exam:  General: Pt awake/alert/oriented in no acute distress  Chest: CTA No chest wall pain w good excursion  CV: Pulses intact. Regular rhythm  MS: Normal AROM mjr joints. No obvious deformity  Abdomen: Soft. Nondistended. Midline wound-clean beefy red, dressing replaced. No evidence of peritonitis. No incarcerated hernias.  Ext: SCDs BLE. No mjr edema. 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   Acute on chronic renal failure   Hemiparesis affecting left side as late effect of cerebrovascular accident   Rectal bleeding    Results:   Labs: Results for orders placed during the hospital encounter of 11/24/13 (from the past 48 hour(s))  GLUCOSE, CAPILLARY     Status: Abnormal   Collection Time    12/07/13 12:01 PM      Result Value Ref Range   Glucose-Capillary 134 (*) 70 - 99 mg/dL  GLUCOSE, CAPILLARY     Status: Abnormal   Collection Time    12/07/13  6:32 PM      Result Value Ref Range   Glucose-Capillary 110 (*) 70 - 99 mg/dL  HEMOGLOBIN AND HEMATOCRIT, BLOOD     Status:  Abnormal   Collection Time    12/07/13  8:45 PM      Result Value Ref Range   Hemoglobin 9.3 (*) 12.0 - 15.0 g/dL   HCT 29.2 (*) 36.0 - 46.0 %  GLUCOSE, CAPILLARY     Status: Abnormal   Collection Time    12/07/13 11:42 PM      Result Value Ref Range   Glucose-Capillary 111 (*) 70 - 99 mg/dL  CBC     Status: Abnormal   Collection Time    12/08/13  3:12 AM      Result Value Ref Range   WBC 15.1 (*) 4.0 - 10.5 K/uL   RBC 3.39 (*) 3.87 - 5.11 MIL/uL   Hemoglobin 10.0 (*) 12.0 - 15.0 g/dL   HCT 31.2 (*) 36.0 - 46.0 %   MCV 92.0  78.0 - 100.0 fL   MCH 29.5  26.0 - 34.0 pg   MCHC 32.1  30.0 - 36.0 g/dL   RDW 14.6  11.5 - 15.5 %   Platelets 170  150 - 400 K/uL  GLUCOSE, CAPILLARY     Status: Abnormal   Collection Time    12/08/13  5:52 AM      Result Value Ref Range  Glucose-Capillary 104 (*) 70 - 99 mg/dL  GLUCOSE, CAPILLARY     Status: Abnormal   Collection Time    12/08/13 12:19 PM      Result Value Ref Range   Glucose-Capillary 110 (*) 70 - 99 mg/dL  GLUCOSE, CAPILLARY     Status: Abnormal   Collection Time    12/08/13  6:28 PM      Result Value Ref Range   Glucose-Capillary 114 (*) 70 - 99 mg/dL  GLUCOSE, CAPILLARY     Status: Abnormal   Collection Time    12/08/13 11:25 PM      Result Value Ref Range   Glucose-Capillary 106 (*) 70 - 99 mg/dL  COMPREHENSIVE METABOLIC PANEL     Status: Abnormal   Collection Time    12/09/13  3:42 AM      Result Value Ref Range   Sodium 143  137 - 147 mEq/L   Potassium 4.1  3.7 - 5.3 mEq/L   Chloride 108  96 - 112 mEq/L   CO2 25  19 - 32 mEq/L   Glucose, Bld 114 (*) 70 - 99 mg/dL   BUN 21  6 - 23 mg/dL   Creatinine, Ser 0.71  0.50 - 1.10 mg/dL   Calcium 8.8  8.4 - 10.5 mg/dL   Total Protein 5.9 (*) 6.0 - 8.3 g/dL   Albumin 2.4 (*) 3.5 - 5.2 g/dL   AST 17  0 - 37 U/L   ALT 11  0 - 35 U/L   Alkaline Phosphatase 48  39 - 117 U/L   Total Bilirubin <0.2 (*) 0.3 - 1.2 mg/dL   GFR calc non Af Amer 74 (*) >90 mL/min   GFR calc Af  Amer 86 (*) >90 mL/min   Comment: (NOTE)     The eGFR has been calculated using the CKD EPI equation.     This calculation has not been validated in all clinical situations.     eGFR's persistently <90 mL/min signify possible Chronic Kidney     Disease.  MAGNESIUM     Status: None   Collection Time    12/09/13  3:42 AM      Result Value Ref Range   Magnesium 1.9  1.5 - 2.5 mg/dL  PHOSPHORUS     Status: None   Collection Time    12/09/13  3:42 AM      Result Value Ref Range   Phosphorus 3.6  2.3 - 4.6 mg/dL  CBC     Status: Abnormal   Collection Time    12/09/13  3:42 AM      Result Value Ref Range   WBC 12.2 (*) 4.0 - 10.5 K/uL   RBC 3.06 (*) 3.87 - 5.11 MIL/uL   Hemoglobin 8.9 (*) 12.0 - 15.0 g/dL   HCT 28.4 (*) 36.0 - 46.0 %   MCV 92.8  78.0 - 100.0 fL   MCH 29.1  26.0 - 34.0 pg   MCHC 31.3  30.0 - 36.0 g/dL   RDW 14.7  11.5 - 15.5 %   Platelets 158  150 - 400 K/uL  DIFFERENTIAL     Status: Abnormal   Collection Time    12/09/13  3:42 AM      Result Value Ref Range   Neutrophils Relative % 63  43 - 77 %   Neutro Abs 7.7  1.7 - 7.7 K/uL   Lymphocytes Relative 13  12 - 46 %   Lymphs Abs 1.6  0.7 -  4.0 K/uL   Monocytes Relative 23 (*) 3 - 12 %   Monocytes Absolute 2.8 (*) 0.1 - 1.0 K/uL   Eosinophils Relative 0  0 - 5 %   Eosinophils Absolute 0.1  0.0 - 0.7 K/uL   Basophils Relative 0  0 - 1 %   Basophils Absolute 0.0  0.0 - 0.1 K/uL  TRIGLYCERIDES     Status: None   Collection Time    12/09/13  3:42 AM      Result Value Ref Range   Triglycerides 122  <150 mg/dL   Comment: Performed at [place], CAPILLARY     Status: Abnormal   Collection Time    12/09/13  5:36 AM      Result Value Ref Range   Glucose-Capillary 115 (*) 70 - 99 mg/dL  RETICULOCYTES     Status: Abnormal   Collection Time    12/09/13  8:40 AM      Result Value Ref Range   Retic Ct Pct 1.2  0.4 - 3.1 %   RBC. 3.22 (*) 3.87 - 5.11 MIL/uL   Retic Count, Manual 38.6  19.0 -  186.0 K/uL    Imaging / Studies: No results found.  Medications / Allergies: per chart  Antibiotics: Anti-infectives   Start     Dose/Rate Route Frequency Ordered Stop   11/28/13 0000  piperacillin-tazobactam (ZOSYN) IVPB 3.375 g  Status:  Discontinued     3.375 g 12.5 mL/hr over 240 Minutes Intravenous Every 8 hours 11/27/13 1623 12/05/13 1234   11/27/13 1500  piperacillin-tazobactam (ZOSYN) IVPB 3.375 g     3.375 g 100 mL/hr over 30 Minutes Intravenous  Once 11/27/13 1443 11/27/13 1445   11/25/13 2359  [MAR Hold]  ceFEPIme (MAXIPIME) 1 g in dextrose 5 % 50 mL IVPB  Status:  Discontinued     (On MAR Hold since 11/27/13 1345)   1 g 100 mL/hr over 30 Minutes Intravenous Every 24 hours 11/25/13 2300 11/27/13 1633   11/24/13 1800  ampicillin-sulbactam (UNASYN) 1.5 g in sodium chloride 0.9 % 50 mL IVPB  Status:  Discontinued     1.5 g 100 mL/hr over 30 Minutes Intravenous Every 12 hours 11/24/13 1549 11/25/13 0956      Assessment/Plan  DIAGNOSTIC LAPAROSCOPY, OPEN RESECTION OF SMALL BOWEL - 11/27/2013 - Dr. Alphonsa Overall - for closed loop small bowel obstruction with perforation  PCM POD #12  -Zosyn course completed 2/18-2/27.  White count trending down.  -advance to soft diet, start a calorie count to evaluate oral intake  -up to chair with meals -PT evaluation to help with mobility  -TPN  -BID wet to dry dressing changes  DVT prophylaxis - Lovenox  Dementia / Short term memory loss  History of stroke - history of right frontal stroke with left-sided hemiparesis resulting in gait instability  HTN-per primary team  Erby Pian, [facility] Pager [phone] Office [phone]  12/09/2013 9:10 AM

## 2013-12-09 NOTE — Progress Notes (Signed)
PARENTERAL NUTRITION CONSULT NOTE - FOLLOW UP  Pharmacy Consult for TNA Indication: SBO  Allergies  Allergen Reactions  . Sulfa Antibiotics Other (See Comments)    unknown    Patient Measurements: Height: 5\' 5"  (165.1 cm) Weight: 126 lb 15.8 oz (57.6 kg) IBW/kg (Calculated) : 57 Usual Weight: 55.5 kg  Vital Signs: Temp: 97.9 F (36.6 C) (03/02 0538) Temp src: Oral (03/02 0538) BP: 131/56 mmHg (03/02 0538) Pulse Rate: 88 (03/02 0538) Intake/Output from previous day: 03/01 0701 - 03/02 0700 In: 1757.5 [I.V.:857.5; TPN:900] Out: 3275 [Urine:3275]  Labs:  Recent Labs  12/07/13 0435 12/07/13 2045 12/08/13 0312 12/09/13 0342  WBC 12.0*  --  15.1* 12.2*  HGB 9.9* 9.3* 10.0* 8.9*  HCT 31.2* 29.2* 31.2* 28.4*  PLT 144*  --  170 158     Recent Labs  12/07/13 0435 12/09/13 0342  NA 142 143  K 4.1 4.1  CL 108 108  CO2 24 25  GLUCOSE 117* 114*  BUN 20 21  CREATININE 0.70 0.71  CALCIUM 8.6 8.8  MG  --  1.9  PHOS  --  3.6  PROT 5.9* 5.9*  ALBUMIN 2.4* 2.4*  AST 21 17  ALT 14 11  ALKPHOS 50 48  BILITOT 0.3 <0.2*  TRIG  --  122   Estimated Creatinine Clearance: 42.9 ml/min (by C-G formula based on Cr of 0.71).    Recent Labs  12/08/13 1828 12/08/13 2325 12/09/13 0536  GLUCAP 114* 106* 115*    CBGs & Insulin requirements past 24 hours:  - CBGs < 150, required 0 units Novolog SSI  Assessment:  18 yoF with history of stroke resulting in left-sided hemiparesis and gait instability, hx dementia, severe scoliosis, CKD-III, presented with anorexia and N/V on 2/15, found to have SBO with perforation. Patient underwent laparotomy with small bowel resection 2/18. Given delayed recovery of bowel function, pharmacy consulted to begin TNA 2/24.   2/27: Tolerating TNA. NGT output decreased, now clamped, no N/V noted, passing flatus and multiple BM 2/26. WBC trending down and patient improving.  Continue TNA until tolerating PO's 2/28: advancing to FLD, tolerated  sips of clears. WBC remains slightly elevated. Passing flatus, hypoactive BS, passing watery brown stool. NGT removed 2/27 3/1: back to sips only, no flatus/BS, now w 1 instance of bloody stool, WBC increased  3/2: Over past few days diet was slowly advanced to full liquids and NGT removed, but passed a bloody stool so diet was returned to sips only.  Subsequently passed a more normal BM without significant blood and full liquid diet was restarted.  Nutritional Goals:  - RD recs 2/23: 1700-1900 KCal, 75-90g protein per day - Clinimix E 5/20 @ 65 + IV Fat Emulsion 20% at 10 ml/hr will provide 1853 Kcal and 78 g protein per day.    Current nutrition:  - Diet: full liquids - TNA: Clinimix E 5/20 @ 65 ml/hr + Fat Emulsion at 10 ml/hr - mIVF: D5 1/2 NS + 20 mEq Kcl per L @ 35 ml/hr   Labs: Electrolytes: WNL Renal Function: SCr WNL and stable Hepatic Function: LFTs below ULN Pre-Albumin: 12.7 (2/25); today's value pending Triglycerides: WNL CBGs: controlled; requiring little or no sliding scale insulin   TNA day#7 (2/24 - present) TNA access: PICC placed 2/24  Plan:    Continue Clinimix E 5/20 at goal of 65 ml/hr  TNA to contain IV fat emulsion 20% at 10 ml/hr daily  Standard multivitamins daily  Trace elements only on  MWF due to national backorder  Continue IV fluid at 35 ml/hr  Continue sensitive SSI 4 times daily  TNA labs Monday/Thursdays  F/u diet advancement/toleration  Elie Goodyandy Francy Mcilvaine, PharmD, BCPS Pager: [phone] 12/09/2013  7:48 AM

## 2013-12-09 NOTE — Progress Notes (Signed)
General Surgery [facility]  Patient seen and examined.  Son at bedside.  Patient had oatmeal for breakfast followed by emesis.  Abd exam benign with intact dressing, soft, active BS, non-tender.  Will try regular diet for lunch.  If persistent N/V, will get AXR.  Will follow closely.  Hopefully continued improvement and out of hospital soon.  Velora Hecklerodd M. Sophee Mckimmy, MD, [facility] Office: [phone]

## 2013-12-09 NOTE — Progress Notes (Signed)
Occupational Therapy Treatment Patient Details Name: Lindsey Hensley MRN: [id number] DOB: Mar 20, 1922 Today's Date: 12/09/2013 Time: [id number] OT Time Calculation (min): 25 min  OT Assessment / Plan / Recommendation  History of present illness pt is s/p exploratory lap   she has a h/o CVA, dementia, L THA 6 months ago   OT comments  Pt requires encouragement for max participation.  Overall, she requires Min A with BADLs, and moderate verbal cueing for safety   Follow Up Recommendations  Supervision/Assistance - 24 hour    Barriers to Discharge       Equipment Recommendations  3 in 1 bedside comode    Recommendations for Other Services    Frequency Min 2X/week   Progress towards OT Goals Progress towards OT goals: Progressing toward goals  Plan Discharge plan remains appropriate    Precautions / Restrictions Precautions Precautions: Fall Precaution Comments: dementia with short term memory loss   Pertinent Vitals/Pain     ADL  Grooming: Wash/dry hands;Wash/dry face;Brushing hair;Minimal assistance Where Assessed - Grooming: Supported standing Toilet Transfer: Minimal Dentistassistance Toilet Transfer Method: Sit to stand;Stand Wellsite geologistpivot Toilet Transfer Equipment: Comfort height toilet;Grab bars Toileting - ArchitectClothing Manipulation and Hygiene: Minimal assistance Where Assessed - Engineer, miningToileting Clothing Manipulation and Hygiene: Sit to stand from 3-in-1 or toilet Equipment Used: Rolling walker Transfers/Ambulation Related to ADLs: min a, and cues for hand placement and walker safety  ADL Comments: Pt required step by step cues for walker safety as she stays outside of walker in flexed position.      OT Diagnosis:    OT Problem List:   OT Treatment Interventions:     OT Goals(current goals can now be found in the care plan section) ADL Goals Pt Will Perform Lower Body Bathing: with min assist;sit to/from stand Pt Will Perform Lower Body Dressing: with min assist;sit to/from stand Pt Will  Transfer to Toilet: with min guard assist;bedside commode;ambulating Pt Will Perform Toileting - Clothing Manipulation and hygiene: with min assist;sit to/from stand  Visit Information  Last OT Received On: 12/09/13 Assistance Needed: +1 History of Present Illness: pt is s/p exploratory lap   she has a h/o CVA, dementia, L THA 6 months ago    Subjective Data      Prior Functioning       Cognition  Cognition Arousal/Alertness: Awake/alert Behavior During Therapy: WFL for tasks assessed/performed Overall Cognitive Status: History of cognitive impairments - at baseline Memory: Decreased short-term memory    Mobility  Bed Mobility Bed Mobility: Sit to Supine Sit to supine: Supervision Transfers Overall transfer level: Needs assistance Equipment used: Rolling walker (2 wheeled) Transfers: Sit to/from UGI CorporationStand;Stand Pivot Transfers Sit to Stand: Min assist Stand pivot transfers: Min assist General transfer comment: verbal and tactile cues for hand placement and safety     Exercises      Balance Balance Overall balance assessment: Needs assistance Standing balance support: Single extremity supported Standing balance-Leahy Scale: Good  End of Session OT - End of Session Equipment Utilized During Treatment: Rolling walker Activity Tolerance: Patient tolerated treatment well Patient left: in bed;with call bell/phone within reach;with bed alarm set;with nursing/sitter in room;with family/visitor present Nurse Communication: Mobility status  GO     Merwyn Hodapp M 12/09/2013, 1:30 PM

## 2013-12-10 ENCOUNTER — Inpatient Hospital Stay (HOSPITAL_COMMUNITY): Payer: Medicare HMO

## 2013-12-10 LAB — CBC
HCT: 26.8 % — ABNORMAL LOW (ref 36.0–46.0)
Hemoglobin: 8.6 g/dL — ABNORMAL LOW (ref 12.0–15.0)
MCH: 29.7 pg (ref 26.0–34.0)
MCHC: 32.1 g/dL (ref 30.0–36.0)
MCV: 92.4 fL (ref 78.0–100.0)
PLATELETS: 166 10*3/uL (ref 150–400)
RBC: 2.9 MIL/uL — ABNORMAL LOW (ref 3.87–5.11)
RDW: 14.7 % (ref 11.5–15.5)
WBC: 11.4 10*3/uL — ABNORMAL HIGH (ref 4.0–10.5)

## 2013-12-10 LAB — GLUCOSE, CAPILLARY
GLUCOSE-CAPILLARY: 134 mg/dL — AB (ref 70–99)
Glucose-Capillary: 113 mg/dL — ABNORMAL HIGH (ref 70–99)
Glucose-Capillary: 108 mg/dL — ABNORMAL HIGH (ref 70–99)
Glucose-Capillary: 123 mg/dL — ABNORMAL HIGH (ref 70–99)

## 2013-12-10 MED ORDER — SODIUM CHLORIDE 0.9 % IV SOLN
125.0000 mg | Freq: Once | INTRAVENOUS | Status: AC
  Administered 2013-12-10: 125 mg via INTRAVENOUS
  Filled 2013-12-10: qty 10

## 2013-12-10 MED ORDER — BOOST / RESOURCE BREEZE PO LIQD
1.0000 | Freq: Two times a day (BID) | ORAL | Status: DC
  Administered 2013-12-10 – 2013-12-14 (×7): 1 via ORAL

## 2013-12-10 MED ORDER — M.V.I. ADULT IV INJ
INTRAVENOUS | Status: DC
  Administered 2013-12-10: 17:00:00 via INTRAVENOUS
  Filled 2013-12-10: qty 2000

## 2013-12-10 MED ORDER — PANTOPRAZOLE SODIUM 40 MG PO TBEC
40.0000 mg | DELAYED_RELEASE_TABLET | Freq: Every day | ORAL | Status: DC
  Administered 2013-12-10 – 2013-12-17 (×8): 40 mg via ORAL
  Filled 2013-12-10 (×9): qty 1

## 2013-12-10 MED ORDER — METOCLOPRAMIDE HCL 10 MG PO TABS
5.0000 mg | ORAL_TABLET | Freq: Four times a day (QID) | ORAL | Status: DC | PRN
  Administered 2013-12-10 – 2013-12-11 (×2): 10 mg via ORAL
  Filled 2013-12-10 (×2): qty 1

## 2013-12-10 MED ORDER — FAT EMULSION 20 % IV EMUL
250.0000 mL | INTRAVENOUS | Status: DC
  Administered 2013-12-10: 250 mL via INTRAVENOUS
  Filled 2013-12-10: qty 250

## 2013-12-10 NOTE — Progress Notes (Signed)
NUTRITION FOLLOW UP  Intervention:   - TPN per pharmacy - Anti-emetics per MD, pt may benefit more from scheduled versus PRN anti-emetics  - Will continue to monitor and analyze calorie count  Nutrition Dx:   Inadequate oral intake related to inability to eat as evidenced by NPO - ongoing but now related to nausea/vomiting as evidenced by RN report.    Goal:   Advance diet as tolerated to regular diet - met  New goals: TPN to meet >90% of estimated nutritional needs  Pt to consume >50% of meals/supplements  Monitor:   Weights, labs, intake, calorie count, nausea/vomiting   Assessment:   Pt lives at home with close supervision by son and daughter-in-law, with history of short-term memory loss consistent with dementia, chronic and intermittent dizziness with no evidence of orthostasis, osteoporosis with history of left hip fracture last year, chronic kidney disease stage III, mild anemia, hypertension, and history of right frontal stroke with left-sided hemiparesis resulting in gait instability dating back to 2012, she is also status post laparoscopic cholecystectomy in 2010. However per son's report, the patient on February 13 as well as February 14 has had recurrent nausea with her breakfast, decreased oral intake, anorexic with dinner, not taking in very much at all. Son reports pt with increased confusion over the past 6 months. Found to have small bowel obstruction and had small bowel resection on 2/18.   2/23 - Pt asleep and alone in room with NGT in place. Had 620m output last night, 416mdocumented so far today bile like in appearance per nursing documentation. Pt on day 8 of clear liquid diet/NPO.   3/3 - Had vomiting yesterday and today. Per conversation with RN today, pt vomited [place]. Met with pt who does not remember how she has been eating and son who reports pt has been getting sick. Had not consumed any nutritional supplements  today. Pt's weight up 5 pounds since admission.   TPN: Clinimix E 5/20 @ 65 + IV Fat Emulsion 20% at 10 ml/hr will provide 1853 Kcal and 78 g protein per day which meets 100% of estimated nutritional needs  PALB WNL CBGs <150 mg/dL Triglycerides WNL  Height: Ht Readings from Last 1 Encounters:  11/27/13 5' 5" (1.651 m)    Weight Status:   Wt Readings from Last 1 Encounters:  12/10/13 131 lb 6.3 oz (59.6 kg)  Admit wt:        126 lb 14.4 oz (57.5 kg) Net I/Os: +1.3 L  Re-estimated needs:  Kcal: 1700-1900  Protein: 75-90g  Fluid: 1.7-1.9L/day   Skin: Abdominal incision    Diet Order: Clear Liquid   Intake/Output Summary (Last 24 hours) at 12/10/13 1455 Last data filed at 12/10/13 1400  Gross per 24 hour  Intake   1740 ml  Output   2750 ml  Net  -1010 ml    Last BM: 3/3   Labs:   Recent Labs Lab 12/04/13 0418 12/05/13 0530 12/07/13 0435 12/09/13 0342  NA 140 139 142 143  K 3.6* 4.1 4.1 4.1  CL 104 104 108 108  CO2 _0 BUN _1 CREATININE 0.76 0.79 0.70 0.71  CALCIUM 8.4 8.5 8.6 8.8  MG 1.7 1.8  --  1.9  PHOS 2.8 3.2  --  3.6  GLUCOSE 134* 127* 117* 114*    CBG (last 3)   Recent Labs  12/09/13 2331 12/10/13 0531 12/10/13  1356  GLUCAP 114* 113* 108*    Scheduled Meds: . antiseptic oral rinse  15 mL Mouth Rinse q12n4p  . aspirin  325 mg Oral Daily  . chlorhexidine  15 mL Mouth Rinse BID  . feeding supplement (ENSURE COMPLETE)  237 mL Oral BID BM  . ferric gluconate (FERRLECIT/NULECIT) IV  125 mg Intravenous Once  . insulin aspart  0-9 Units Subcutaneous 4 times per day  . pantoprazole  40 mg Oral Daily    Continuous Infusions: . dextrose 5 % and 0.45 % NaCl with KCl 20 mEq/L 35 mL/hr at 12/09/13 1755  . Marland KitchenTPN (CLINIMIX-E) Adult 65 mL/hr at 12/09/13 1811   And  . fat emulsion 240 mL (12/09/13 1800)  . Marland KitchenTPN (CLINIMIX-E) Adult     And  . fat emulsion      Mikey College MS, RD, LDN [phone] Pager [phone] After Hours  Pager

## 2013-12-10 NOTE — Progress Notes (Signed)
Subjective: Vomitted multiple times yesterday after breakfast (full liquids) and then with lunch (regular diet).  Better after changing back to clear liquids/ice chips last night.  Denies abdominal pain, nausea this am.    Objective: Vital signs in last 24 hours: Temp:  [97.8 F (36.6 C)-98.6 F (37 C)] 98.5 F (36.9 C) (03/03 0535) Pulse Rate:  [93-100] 98 (03/03 0535) Resp:  [18-20] 18 (03/03 0535) BP: (130-149)/(55-77) 130/55 mmHg (03/03 0535) SpO2:  [95 %-98 %] 97 % (03/03 0535) Weight:  [59.6 kg (131 lb 6.3 oz)] 59.6 kg (131 lb 6.3 oz) (03/03 0535) Weight change: 2 kg (4 lb 6.6 oz) Last BM Date: 12/09/13  CBG (last 3)   Recent Labs  12/09/13 1738 12/09/13 2331 12/10/13 0531  GLUCAP 115* 114* 113*    Intake/Output from previous day: 03/02 0701 - 03/03 0700 In: 3150 [P.O.:360; I.V.:1890; TPN:900] Out: 3250 [Urine:3200; Emesis/NG output:50] Intake/Output this shift:    General appearance: alert and frail Eyes: no scleral icterus Throat: oropharynx moist without erythema Resp: clear to auscultation bilaterally Cardio: regular rate and rhythm GI: slightly more distended, non-tender; bowel sounds normal; no masses,  no organomegaly Extremities: no clubbing, cyanosis or edema   Lab Results:  Recent Labs  12/09/13 0342  NA 143  K 4.1  CL 108  CO2 25  GLUCOSE 114*  BUN 21  CREATININE 0.71  CALCIUM 8.8  MG 1.9  PHOS 3.6    Recent Labs  12/09/13 0342  AST 17  ALT 11  ALKPHOS 48  BILITOT <0.2*  PROT 5.9*  ALBUMIN 2.4*    Recent Labs  12/09/13 0342 12/10/13 0555  WBC 12.2* 11.4*  NEUTROABS 7.7  --   HGB 8.9* 8.6*  HCT 28.4* 26.8*  MCV 92.8 92.4  PLT 158 166   Lab Results  Component Value Date   INR 1.10 02/25/2011   No results found for this basename: CKTOTAL, CKMB, CKMBINDEX, TROPONINI,  in the last 72 hours No results found for this basename: TSH, T4TOTAL, FREET3, T3FREE, THYROIDAB,  in the last 72 hours  Recent Labs  12/09/13 0840   VITAMINB12 1093*  FOLATE 8.6  FERRITIN 126  TIBC 274  IRON 41*  RETICCTPCT 1.2    Studies/Results: Dg Abd 1 View  12/09/2013   CLINICAL DATA:  Abdominal pain, nausea, vomiting, history of gastric neoplasia  EXAM: ABDOMEN - 1 VIEW  COMPARISON:  DG ABD 2 VIEWS dated 12/03/2013  FINDINGS: Air is seen within nondilated loops of large small bowel. Surgical clips identified within the right upper quadrant and a skin staple in the left upper quadrant. Dystrophic calcifications project in the region of the pelvis, likely reflecting a degenerating fibroid. The patient is status post total left hip arthroplasty visualized portions appear intact. S-shaped scoliosis identified within the thoracolumbar spine is of the severe multilevel spondylosis.  IMPRESSION: Nonobstructive bowel gas pattern.   Electronically Signed   By: Salome HolmesHector  Cooper M.D.   On: 12/09/2013 15:15     Medications: Scheduled: . antiseptic oral rinse  15 mL Mouth Rinse q12n4p  . aspirin  325 mg Oral Daily  . chlorhexidine  15 mL Mouth Rinse BID  . feeding supplement (ENSURE COMPLETE)  237 mL Oral BID BM  . insulin aspart  0-9 Units Subcutaneous 4 times per day  . pantoprazole (PROTONIX) IV  40 mg Intravenous Q24H   Continuous: . dextrose 5 % and 0.45 % NaCl with KCl 20 mEq/L 35 mL/hr at 12/09/13 1755  . Marland Kitchen.TPN (CLINIMIX-E)  Adult 65 mL/hr at 12/09/13 1811   And  . fat emulsion 240 mL (12/09/13 1800)    Assessment/Plan: Principal Problem:  1. Small bowel obstruction with perforation s/p Ex Lap with bowel repair (POD #13). Unable to advance diet due to recurrent N/V.  AXR shows nonobstructive pattern.  Diet changes per General Surgery. Continue TNA until tolerating pos though ?could it be contributing to N/V. Active Problems:  2. Rectal Bleeding- isolated event may be related to slow bowel transit (?stercoral ulcer). If no further bleeding will simply monitor. If recurrent may need GI evaluation. Lovenox held but remains on ASA due to  history of CVA.  3. Anemia- Hg relatively stable after recent isolated episode of rectal bleeding. Will transfuse if Hg<8.0.  IV Iron (avoid oral iron due to potential GI side effects). Likely mutifactorial secondary to bleeding, acute illness, poor nutritional status.  4. Severe protein calorie malnutrition-continue TNA until po intake improves.  5. Acute on Chronic Renal Failure- resolved  6. 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 No behavioral issues.  7. History of CVA (cerebrovascular accident) with left hemiparesis- Aspirin restarted. BP reasonable (avoid hypotension due to prior CVA in setting of relative hypotension)  8. Disposition- anticipate discharge when tolerating diet- anticipate in 2-3 days. Son and patient are agreeable to SNF rehab when stable for discharge.  Will need to be off TNA for discharge to SNF.     LOS: 16 days   Martha Clan 12/10/2013, 7:05 AM

## 2013-12-10 NOTE — Progress Notes (Signed)
UGI with SBFT essentially normal.  Will monitor for now.  Will consider a GI consultation should symptoms persist.  Pati Thinnes, ANP-BC

## 2013-12-10 NOTE — Progress Notes (Signed)
Pt just had a nosebleed- moderate amount of blood. After applying pressure to bridge of nose for about 5 minutes, bleeding stopped. BP 147/68, HR 102. Will continue to monitor pt.

## 2013-12-10 NOTE — Progress Notes (Signed)
Calorie Count Note  Intervention:  - Change supplement to Resource Breeze BID as pt now on clear liquids - TPN per pharmacy - Will continue to monitor and analyze calorie count   48 hour calorie count ordered.  Diet: Clear liquid  Supplements: Ensure Complete BID   3/2 - Pt on soft diet  Dinner: 419 calories, 29g protein   3/3 - Clear liquid diet Per RN - pt vomited everything she tried to eat today    Total intake 3/2: 419 kcal (24% of minimum estimated needs)  29g protein (39% of minimum estimated needs)  Nutrition Dx: Inadequate oral intake related to nausea/vomiting as evidenced by RN report - ongoing    Goal:  TPN to meet >90% of estimated nutritional needs - met Pt to consume >50% of meals/supplements - not met  TPN: Clinimix E 5/20 @ 65 + IV Fat Emulsion 20% at 10 ml/hr will provide 1853 Kcal and 78 g protein per day which meets 100% of estimated nutritional needs    Mikey College MS, [place], [place] Pager [phone] After Hours Pager

## 2013-12-10 NOTE — Progress Notes (Signed)
PARENTERAL NUTRITION CONSULT NOTE - FOLLOW UP  Pharmacy Consult for TNA Indication: SBO  Allergies  Allergen Reactions  . Sulfa Antibiotics Other (See Comments)    unknown    Patient Measurements: Height: 5\' 5"  (165.1 cm) Weight: 131 lb 6.3 oz (59.6 kg) IBW/kg (Calculated) : 57 Usual Weight: 55.5 kg  Vital Signs: Temp: 98.5 F (36.9 C) (03/03 0535) Temp src: Oral (03/03 0535) BP: 130/55 mmHg (03/03 0535) Pulse Rate: 98 (03/03 0535) Intake/Output from previous day: 03/02 0701 - 03/03 0700 In: 3150 [P.O.:360; I.V.:1890; TPN:900] Out: 3250 [Urine:3200; Emesis/NG output:50]  Labs:  Recent Labs  12/08/13 0312 12/09/13 0342 12/10/13 0555  WBC 15.1* 12.2* 11.4*  HGB 10.0* 8.9* 8.6*  HCT 31.2* 28.4* 26.8*  PLT 170 158 166     Recent Labs  12/09/13 0342  NA 143  K 4.1  CL 108  CO2 25  GLUCOSE 114*  BUN 21  CREATININE 0.71  CALCIUM 8.8  MG 1.9  PHOS 3.6  PROT 5.9*  ALBUMIN 2.4*  AST 17  ALT 11  ALKPHOS 48  BILITOT <0.2*  PREALBUMIN 18.3  TRIG 122   Estimated Creatinine Clearance: 42.9 ml/min (by C-G formula based on Cr of 0.71).    Recent Labs  12/09/13 1738 12/09/13 2331 12/10/13 0531  GLUCAP 115* 114* 113*    CBGs & Insulin requirements past 24 hours:  - CBGs < 150, required 0 units Novolog SSI  Assessment:  89 yoF with history of stroke resulting in left-sided hemiparesis and gait instability, hx dementia, severe scoliosis, CKD-III, presented with anorexia and N/V on 2/15, found to have SBO with perforation. Patient underwent laparotomy with small bowel resection 2/18. Given delayed recovery of bowel function, pharmacy consulted to begin TNA 2/24.   2/28: advancing to FLD, tolerated sips of clears.  Passing flatus, hypoactive BS, passing watery brown stool. NGT removed 2/27 3/1: back to sips only, no flatus/BS, now w 1 instance of bloody stool and diet returned to sips only.  3/3:  TNA day# 8 (2/24 - present).  Bowel function improving  yesterday and pt was advanced to full liquid diet (Bfast) and regular diet (lunch) but developed multiple episodes of N/V.  Clear liquid diet was resumed.  Reglan was added and pending UGI with SBFT to eval for SBO/narrowing.  TNA to continue at goal rate.  Pharmacy also asked to dose IV iron today.  Nutritional Goals:  - RD recs 2/23: 1700-1900 KCal, 75-90g protein per day - Clinimix E 5/20 @ 65 + IV Fat Emulsion 20% at 10 ml/hr will provide 1853 Kcal and 78 g protein per day.    Current nutrition:  - Diet: Clear liquids - TNA: Clinimix E 5/20 @ 65 ml/hr + Fat Emulsion at 10 ml/hr - mIVF: D5 1/2 NS + 20 mEq Kcl per L @ 35 ml/hr   Labs: Electrolytes: WNL Renal Function: SCr WNL and stable Hepatic Function: LFTs below ULN Pre-Albumin: 12.7 (2/25); 18.3 (3/2) Triglycerides: 122 (3/2) CBGs: controlled; requiring no sliding scale insulin Iron studies:  WNL, except iron 41 and saturation 15.  Hgb 8.6  TNA access: PICC placed 2/24  Plan:    Continue Clinimix E 5/20 at goal of 65 ml/hr  TNA to contain IV fat emulsion 20% at 10 ml/hr daily  Standard multivitamins daily  Trace elements only on MWF due to national backorder  Continue IV fluid at 35 ml/hr  Continue sensitive SSI 4 times daily  TNA labs Monday/Thursdays  F/u diet advancement/toleration  Ferric Gluconate (Ferrlecit) 125mg  IV infusion x1 dose today.   Lynann Beaver PharmD, BCPS Pager [phone] 12/10/2013 8:18 AM

## 2013-12-10 NOTE — Progress Notes (Signed)
13 Days Post-Op  Subjective: Pt pleasantly demented.  Does not remember what happened yesterday.  Nursing know she's very hungry and eats her entire tray too fast and then gets nauseated and vomited multiple times yesterday after trying a solid diet.  Vomited after trying clears last night.  Up eating breakfast this am.  Mobilizing with PT.    Objective: Vital signs in last 24 hours: Temp:  [97.8 F (36.6 C)-98.6 F (37 C)] 98.5 F (36.9 C) (03/03 0535) Pulse Rate:  [93-100] 98 (03/03 0535) Resp:  [18-20] 18 (03/03 0535) BP: (130-149)/(55-77) 130/55 mmHg (03/03 0535) SpO2:  [95 %-98 %] 97 % (03/03 0535) Weight:  [131 lb 6.3 oz (59.6 kg)] 131 lb 6.3 oz (59.6 kg) (03/03 0535) Last BM Date: 12/09/13  Intake/Output from previous day: 03/02 0701 - 03/03 0700 In: 3150 [P.O.:360; I.V.:1890; TPN:900] Out: 3250 [Urine:3200; Emesis/NG output:50] Intake/Output this shift: Total I/O In: -  Out: 400 [Urine:400]  PE: Gen:  Alert, NAD, pleasant Abd: Soft, NT/ND, +BS, no HSM, midline wound clean with beefy red granulation tissue, one staple in LUQ   Lab Results:   Recent Labs  12/09/13 0342 12/10/13 0555  WBC 12.2* 11.4*  HGB 8.9* 8.6*  HCT 28.4* 26.8*  PLT 158 166   BMET  Recent Labs  12/09/13 0342  NA 143  K 4.1  CL 108  CO2 25  GLUCOSE 114*  BUN 21  CREATININE 0.71  CALCIUM 8.8   PT/INR No results found for this basename: LABPROT, INR,  in the last 72 hours CMP     Component Value Date/Time   NA 143 12/09/2013 0342   K 4.1 12/09/2013 0342   CL 108 12/09/2013 0342   CO2 25 12/09/2013 0342   GLUCOSE 114* 12/09/2013 0342   BUN 21 12/09/2013 0342   CREATININE 0.71 12/09/2013 0342   CALCIUM 8.8 12/09/2013 0342   PROT 5.9* 12/09/2013 0342   ALBUMIN 2.4* 12/09/2013 0342   AST 17 12/09/2013 0342   ALT 11 12/09/2013 0342   ALKPHOS 48 12/09/2013 0342   BILITOT <0.2* 12/09/2013 0342   GFRNONAA 74* 12/09/2013 0342   GFRAA 86* 12/09/2013 0342   Lipase     Component Value Date/Time   LIPASE 12  11/24/2013 0950       Studies/Results: Dg Abd 1 View  12/09/2013   CLINICAL DATA:  Abdominal pain, nausea, vomiting, history of gastric neoplasia  EXAM: ABDOMEN - 1 VIEW  COMPARISON:  DG ABD 2 VIEWS dated 12/03/2013  FINDINGS: Air is seen within nondilated loops of large small bowel. Surgical clips identified within the right upper quadrant and a skin staple in the left upper quadrant. Dystrophic calcifications project in the region of the pelvis, likely reflecting a degenerating fibroid. The patient is status post total left hip arthroplasty visualized portions appear intact. S-shaped scoliosis identified within the thoracolumbar spine is of the severe multilevel spondylosis.  IMPRESSION: Nonobstructive bowel gas pattern.   Electronically Signed   By: Salome HolmesHector  Cooper M.D.   On: 12/09/2013 15:15    Anti-infectives: Anti-infectives   Start     Dose/Rate Route Frequency Ordered Stop   11/28/13 0000  piperacillin-tazobactam (ZOSYN) IVPB 3.375 g  Status:  Discontinued     3.375 g 12.5 mL/hr over 240 Minutes Intravenous Every 8 hours 11/27/13 1623 12/05/13 1234   11/27/13 1500  piperacillin-tazobactam (ZOSYN) IVPB 3.375 g     3.375 g 100 mL/hr over 30 Minutes Intravenous  Once 11/27/13 1443 11/27/13 1445  11/25/13 2359  [MAR Hold]  ceFEPIme (MAXIPIME) 1 g in dextrose 5 % 50 mL IVPB  Status:  Discontinued     (On MAR Hold since 11/27/13 1345)   1 g 100 mL/hr over 30 Minutes Intravenous Every 24 hours 11/25/13 2300 11/27/13 1633   11/24/13 1800  ampicillin-sulbactam (UNASYN) 1.5 g in sodium chloride 0.9 % 50 mL IVPB  Status:  Discontinued     1.5 g 100 mL/hr over 30 Minutes Intravenous Every 12 hours 11/24/13 1549 11/25/13 0956       Assessment/Plan POD #13 s/p DIAGNOSTIC LAPAROSCOPY, OPEN RESECTION OF SMALL BOWEL - 11/27/2013 - Dr. Ovidio Kin - for closed loop small bowel obstruction with perforation  PCM  -Zosyn course completed 2/18-2/27. White count trending down.  -Continues to vomit  after finishing her food tray, will order a UGI with SBFT to eval for SBO/narrowing -Up to chair with meals  -PT evaluation to help with mobility  -TPN, can wean when we know she can tolerate food, try reglan, could easily have some gastric emptying issues given her age -BID wet to dry dressing changes  -Remove staple in LUQ Acute on chronic anemia of chronic dz - Hgb dropped to 8.6 today but platelets going up DVT prophylaxis - Lovenox  Dementia / Short term memory loss  History of stroke - history of right frontal stroke with left-sided hemiparesis resulting in gait instability  HTN-per primary team     LOS: 16 days    DORT, Nekesha Font 12/10/2013, 7:45 AM Pager: [phone]

## 2013-12-10 NOTE — Progress Notes (Signed)
General Surgery [facility]  Patient seen and examined.  Son at bedside.  Plan UGI with SBFT this AM.  Velora Hecklerodd M. Emarion Toral, MD, [facility] Office: [phone]

## 2013-12-11 ENCOUNTER — Inpatient Hospital Stay (HOSPITAL_COMMUNITY): Payer: Medicare HMO

## 2013-12-11 LAB — CBC
HEMATOCRIT: 25.5 % — AB (ref 36.0–46.0)
Hemoglobin: 8.2 g/dL — ABNORMAL LOW (ref 12.0–15.0)
MCH: 29.9 pg (ref 26.0–34.0)
MCHC: 32.2 g/dL (ref 30.0–36.0)
MCV: 93.1 fL (ref 78.0–100.0)
PLATELETS: 159 10*3/uL (ref 150–400)
RBC: 2.74 MIL/uL — ABNORMAL LOW (ref 3.87–5.11)
RDW: 14.9 % (ref 11.5–15.5)
WBC: 12.1 10*3/uL — AB (ref 4.0–10.5)

## 2013-12-11 LAB — GLUCOSE, CAPILLARY
GLUCOSE-CAPILLARY: 120 mg/dL — AB (ref 70–99)
GLUCOSE-CAPILLARY: 119 mg/dL — AB (ref 70–99)
Glucose-Capillary: 114 mg/dL — ABNORMAL HIGH (ref 70–99)
Glucose-Capillary: 116 mg/dL — ABNORMAL HIGH (ref 70–99)
Glucose-Capillary: 126 mg/dL — ABNORMAL HIGH (ref 70–99)

## 2013-12-11 MED ORDER — ENSURE COMPLETE PO LIQD
237.0000 mL | Freq: Two times a day (BID) | ORAL | Status: DC
  Administered 2013-12-11 – 2013-12-17 (×9): 237 mL via ORAL

## 2013-12-11 MED ORDER — INSULIN ASPART 100 UNIT/ML ~~LOC~~ SOLN
0.0000 [IU] | Freq: Three times a day (TID) | SUBCUTANEOUS | Status: DC
  Administered 2013-12-14: 1 [IU] via SUBCUTANEOUS

## 2013-12-11 MED ORDER — INSULIN ASPART 100 UNIT/ML ~~LOC~~ SOLN: 0.0000 [IU] | Freq: Every day | SUBCUTANEOUS | Status: DC

## 2013-12-11 MED ORDER — IOHEXOL 300 MG/ML  SOLN
80.0000 mL | Freq: Once | INTRAMUSCULAR | Status: AC | PRN
Start: 2013-12-11 — End: 2013-12-11
  Administered 2013-12-11: 80 mL via INTRAVENOUS

## 2013-12-11 MED ORDER — M.V.I. ADULT IV INJ
INJECTION | INTRAVENOUS | Status: DC
  Filled 2013-12-11: qty 1000

## 2013-12-11 MED ORDER — SODIUM CHLORIDE 0.9 % IV SOLN
INTRAVENOUS | Status: DC
  Administered 2013-12-11 – 2013-12-15 (×5): via INTRAVENOUS
  Administered 2013-12-17: 1 mL via INTRAVENOUS

## 2013-12-11 MED ORDER — FAT EMULSION 20 % IV EMUL
250.0000 mL | INTRAVENOUS | Status: DC
  Filled 2013-12-11: qty 250

## 2013-12-11 NOTE — Progress Notes (Signed)
Patient ID: Lindsey Hensley, female   DOB: 1922-02-26, 80 y.o.   MRN: [id number]  Subjective: No n/v.  BMs recorded yesterday.  Pt sitting up in a chair.  NAD.    Objective:  Vital signs:  Filed Vitals:   12/10/13 0535 12/10/13 1400 12/10/13 2129 12/11/13 0537  BP: 130/55 125/69 114/57 121/75  Pulse: 98 86 102 99  Temp: 98.5 F (36.9 C) 98.1 F (36.7 C) 98 F (36.7 C) 98.7 F (37.1 C)  TempSrc: Oral Oral Oral Oral  Resp: 18 18 18 18   Height:      Weight: 131 lb 6.3 oz (59.6 kg)     SpO2: 97% 96% 95% 97%    Last BM Date: 12/09/13  Intake/Output   Yesterday:  03/03 0701 - 03/04 0700 In: 1676.3 [P.O.:240; I.V.:840; TPN:596.3] Out: 3600 [Urine:3600] This shift:    I/O last 3 completed shifts: In: 3022.5 [P.O.:240; I.V.:1286.3] Out: 5950 [Urine:5900; Emesis/NG output:50]    Physical Exam:  General: Pt awake/alert/oriented in no acute distress  Chest: CTA No chest wall pain w good excursion  CV: Pulses intact. Regular rhythm  MS: Normal AROM mjr joints. No obvious deformity  Abdomen: Soft. Nondistended. Midline wound dressing is c/d/i.   No evidence of peritonitis. No incarcerated hernias.  Ext: SCDs BLE. No mjr edema. 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   Acute on chronic renal failure   Hemiparesis affecting left side as late effect of cerebrovascular accident   Rectal bleeding    Results:   Labs: Results for orders placed during the hospital encounter of 11/24/13 (from the past 48 hour(s))  GLUCOSE, CAPILLARY     Status: Abnormal   Collection Time    12/09/13 11:29 AM      Result Value Ref Range   Glucose-Capillary 109 (*) 70 - 99 mg/dL  GLUCOSE, CAPILLARY     Status: Abnormal   Collection Time    12/09/13  5:38 PM      Result Value Ref  Range   Glucose-Capillary 115 (*) 70 - 99 mg/dL   Comment 1 Notify RN     Comment 2 Documented in Chart    GLUCOSE, CAPILLARY     Status: Abnormal   Collection Time    12/09/13 11:31 PM      Result Value Ref Range   Glucose-Capillary 114 (*) 70 - 99 mg/dL  GLUCOSE, CAPILLARY     Status: Abnormal   Collection Time    12/10/13  5:31 AM      Result Value Ref Range   Glucose-Capillary 113 (*) 70 - 99 mg/dL  CBC     Status: Abnormal   Collection Time    12/10/13  5:55 AM      Result Value Ref Range   WBC 11.4 (*) 4.0 - 10.5 K/uL   RBC 2.90 (*) 3.87 - 5.11 MIL/uL   Hemoglobin 8.6 (*) 12.0 - 15.0 g/dL   HCT 30.826.8 (*) 65.736.0 - 84.646.0 %   MCV 92.4  78.0 - 100.0 fL   MCH 29.7  26.0 - 34.0 pg   MCHC 32.1  30.0 - 36.0 g/dL   RDW 96.214.7  95.211.5 - 84.115.5 %   Platelets 166  150 - 400 K/uL  GLUCOSE, CAPILLARY  Status: Abnormal   Collection Time    12/10/13  1:56 PM      Result Value Ref Range   Glucose-Capillary 108 (*) 70 - 99 mg/dL  GLUCOSE, CAPILLARY     Status: Abnormal   Collection Time    12/10/13  6:47 PM      Result Value Ref Range   Glucose-Capillary 123 (*) 70 - 99 mg/dL  GLUCOSE, CAPILLARY     Status: Abnormal   Collection Time    12/10/13 11:25 PM      Result Value Ref Range   Glucose-Capillary 134 (*) 70 - 99 mg/dL  CBC     Status: Abnormal   Collection Time    12/11/13  3:55 AM      Result Value Ref Range   WBC 12.1 (*) 4.0 - 10.5 K/uL   RBC 2.74 (*) 3.87 - 5.11 MIL/uL   Hemoglobin 8.2 (*) 12.0 - 15.0 g/dL   HCT 16.1 (*) 09.6 - 04.5 %   MCV 93.1  78.0 - 100.0 fL   MCH 29.9  26.0 - 34.0 pg   MCHC 32.2  30.0 - 36.0 g/dL   RDW 40.9  81.1 - 91.4 %   Platelets 159  150 - 400 K/uL  GLUCOSE, CAPILLARY     Status: Abnormal   Collection Time    12/11/13  5:34 AM      Result Value Ref Range   Glucose-Capillary 120 (*) 70 - 99 mg/dL    Imaging / Studies: Dg Abd 1 View  12/09/2013   CLINICAL DATA:  Abdominal pain, nausea, vomiting, history of gastric neoplasia  EXAM: ABDOMEN  - 1 VIEW  COMPARISON:  DG ABD 2 VIEWS dated 12/03/2013  FINDINGS: Air is seen within nondilated loops of large small bowel. Surgical clips identified within the right upper quadrant and a skin staple in the left upper quadrant. Dystrophic calcifications project in the region of the pelvis, likely reflecting a degenerating fibroid. The patient is status post total left hip arthroplasty visualized portions appear intact. S-shaped scoliosis identified within the thoracolumbar spine is of the severe multilevel spondylosis.  IMPRESSION: Nonobstructive bowel gas pattern.   Electronically Signed   By: Salome Holmes M.D.   On: 12/09/2013 15:15   Dg Ugi W/small Bowel  12/10/2013   CLINICAL DATA:  Nausea and vomiting status post exploratory laparotomy and small bowel resection for obstruction.  EXAM: UPPER GI SERIES WITH SMALL BOWEL FOLLOW-THROUGH  TECHNIQUE: Single contrast upper GI series using thin barium. Subsequently, serial images of the small bowel were obtained including spot views of the terminal ileum.  COMPARISON:  Abdominal radiographs 12/09/2013 and earlier. CT abdomen pelvis 11/24/2013.  FLUOROSCOPY TIME:  5 min 24 seconds  FINDINGS: Scout radiograph demonstrates multiple surgical clips in the right mid abdomen. Gas is present in scattered loops of nondilated small and large bowel without evidence of obstruction. Marked multilevel degenerative disc disease and dextroscoliosis are noted in the lumbar spine. Dystrophic calcification in the pelvis is unchanged and likely represents a fibroid. Left total hip arthroplasty is partially visualized.  Evaluation of the esophagus demonstrated some initial release of the contrast bolus from the proximal wave with subsequent stasis in the esophagus. No definite tertiary contractions were seen. Two subsequent individual swallows demonstrated a more effective primary wave without significant dysmotility identified. No esophageal mass or stricture was identified. There is a  small sliding-type hiatal hernia. The stomach was normal in configuration without gross mass or obstruction identified. Duodenum was normally  positioned.  Small bowel transit time was approximately 70 min. Evaluation of pelvic small bowel loops was mildly limited due to superimposition of loops upon one another and decreased patient mobility. However, some separation of pelvic small bowel loops was achieved with prone positioning and using the compression paddle. There is no evidence of small bowel obstruction. No fixed area of small bowel narrowing was identified. The terminal ileum was normal in appearance.  IMPRESSION: 1. Mild, nonspecific esophageal dysmotility. 2. Small sliding hiatal hernia. 3. No evidence of small bowel obstruction or stricture.   Electronically Signed   By: Sebastian Ache   On: 12/10/2013 15:10    Medications / Allergies: per chart  Antibiotics: Anti-infectives   Start     Dose/Rate Route Frequency Ordered Stop   11/28/13 0000  piperacillin-tazobactam (ZOSYN) IVPB 3.375 g  Status:  Discontinued     3.375 g 12.5 mL/hr over 240 Minutes Intravenous Every 8 hours 11/27/13 1623 12/05/13 1234   11/27/13 1500  piperacillin-tazobactam (ZOSYN) IVPB 3.375 g     3.375 g 100 mL/hr over 30 Minutes Intravenous  Once 11/27/13 1443 11/27/13 1445   11/25/13 2359  [MAR Hold]  ceFEPIme (MAXIPIME) 1 g in dextrose 5 % 50 mL IVPB  Status:  Discontinued     (On MAR Hold since 11/27/13 1345)   1 g 100 mL/hr over 30 Minutes Intravenous Every 24 hours 11/25/13 2300 11/27/13 1633   11/24/13 1800  ampicillin-sulbactam (UNASYN) 1.5 g in sodium chloride 0.9 % 50 mL IVPB  Status:  Discontinued     1.5 g 100 mL/hr over 30 Minutes Intravenous Every 12 hours 11/24/13 1549 11/25/13 0956     Assessment/Plan  DIAGNOSTIC LAPAROSCOPY, OPEN RESECTION OF SMALL BOWEL - 11/27/2013 - Dr. Ovidio Kin - for closed loop small bowel obstruction with perforation  PCM  POD #14  -nausea and vomiting better today.  UGI  with SBFT normal.  -Zosyn course completed 2/18-2/27. -advance to soft diet, start a calorie count to evaluate oral intake  -up to chair with meals, walk as tolerated -PT evaluation to help with mobility  -reduce TPN to wean off soon.  If nausea/vomiting persists after this, GI consult. Discussed with son at bedside. -BID wet to dry dressing changes  DVT prophylaxis - Lovenox  Dementia / Short term memory loss  History of stroke - history of right frontal stroke with left-sided hemiparesis resulting in gait instability  HTN-per primary team  Ashok Norris, [facility] Surgery Pager [phone] Office [phone]  12/11/2013 9:43 AM

## 2013-12-11 NOTE — Progress Notes (Signed)
PARENTERAL NUTRITION CONSULT NOTE - FOLLOW UP  Pharmacy Consult for TNA Indication: SBO s/p small bowel resection  Allergies  Allergen Reactions  . Sulfa Antibiotics Other (See Comments)    unknown    Patient Measurements: Height: 5\' 5"  (165.1 cm) Weight: 131 lb 6.3 oz (59.6 kg) IBW/kg (Calculated) : 57 Usual Weight: 55.5 kg  Vital Signs: Temp: 98.7 F (37.1 C) (03/04 0537) Temp src: Oral (03/04 0537) BP: 121/75 mmHg (03/04 0537) Pulse Rate: 99 (03/04 0537) Intake/Output from previous day: 03/03 0701 - 03/04 0700 In: 1676.3 [P.O.:240; I.V.:840; TPN:596.3] Out: 3600 [Urine:3600]  Labs:  Recent Labs  12/09/13 0342 12/10/13 0555 12/11/13 0355  WBC 12.2* 11.4* 12.1*  HGB 8.9* 8.6* 8.2*  HCT 28.4* 26.8* 25.5*  PLT 158 166 159     Recent Labs  12/09/13 0342  NA 143  K 4.1  CL 108  CO2 25  GLUCOSE 114*  BUN 21  CREATININE 0.71  CALCIUM 8.8  MG 1.9  PHOS 3.6  PROT 5.9*  ALBUMIN 2.4*  AST 17  ALT 11  ALKPHOS 48  BILITOT <0.2*  PREALBUMIN 18.3  TRIG 122   Estimated Creatinine Clearance: 42.9 ml/min (by C-G formula based on Cr of 0.71).    Recent Labs  12/10/13 1847 12/10/13 2325 12/11/13 0534  GLUCAP 123* 134* 120*    CBGs & Insulin requirements past 24 hours:  - CBGs < 150, required 0 units Novolog SSI  Assessment:  [age] yoF with history of stroke resulting in left-sided hemiparesis and gait instability, hx dementia, severe scoliosis, CKD-III, presented with anorexia and N/V on 2/15, found to have SBO with perforation. Patient underwent laparotomy with small bowel resection 2/18. Given delayed recovery of bowel function, pharmacy consulted to begin TNA 2/24.   2/28: advancing to FLD, tolerated sips of clears.  Passing flatus, hypoactive BS, passing watery brown stool. NGT removed 2/27 3/1: back to sips only, no flatus/BS, now w 1 instance of bloody stool and diet returned to sips only. 3/4: improved N/V so diet advanced again to soft this  AM  3/4:  TNA day# 9 (2/24 - present).  N/V improved this AM after problems with advancing diet yesterday and resulting N/V - advanced diet this AM back to softs.  Per Md orders, to start weaning TNA tonight. RD currently performing 48hr calorie count  Nutritional Goals:  - RD recs 2/23: 1700-1900 KCal, 75-90g protein per day - Clinimix E 5/20 @ 65 + IV Fat Emulsion 20% at 10 ml/hr will provide 1853 Kcal and 78 g protein per day.    Current nutrition:  - Diet: soft - started 3/4 - TNA: Clinimix E 5/20 @ 65 ml/hr + Fat Emulsion at 10 ml/hr - mIVF: D5 1/2 NS + 20 mEq Kcl per L @ 35 ml/hr   Labs:  Electrolytes: WNL Renal Function: SCr WNL and stable Hepatic Function: LFTs below ULN Pre-Albumin: 12.7 (2/25); 18.3 (3/2) Triglycerides: 122 (3/2) CBGs: controlled; requiring no sliding scale insulin Iron studies:  WNL, except iron 41 and saturation 15.  Hgb 8.6  TNA access: PICC placed 2/24  Plan:    At 1800 tonight with new TNA bag, reduce TNA rate to 40 ml/hr and continue same formula of E 5/20  TNA to contain IV fat emulsion 20% at 10 ml/hr daily  Standard multivitamins daily  Trace elements only on MWF due to national backorder  Continue IV fluid at 35 ml/hr  Continue sensitive SSI 4 times daily but change to [employer]  TNA labs Monday/Thursdays  F/u diet advancement/toleration - possible d/c of TNA tomorrow 3/5 if tolerates diet advancement   Hessie Knows, PharmD, BCPS Pager [phone] 12/11/2013 10:40 AM

## 2013-12-11 NOTE — Progress Notes (Signed)
Subjective: No complaints- RN reports no N/V since yesterday am.    Objective: Vital signs in last 24 hours: Temp:  [98 F (36.7 C)-98.7 F (37.1 C)] 98.7 F (37.1 C) (03/04 0537) Pulse Rate:  [86-102] 99 (03/04 0537) Resp:  [18] 18 (03/04 0537) BP: (114-125)/(57-75) 121/75 mmHg (03/04 0537) SpO2:  [95 %-97 %] 97 % (03/04 0537) Weight change:  Last BM Date: 12/09/13  CBG (last 3)   Recent Labs  12/10/13 1847 12/10/13 2325 12/11/13 0534  GLUCAP 123* 134* 120*    Intake/Output from previous day: 03/03 0701 - 03/04 0700 In: 1676.3 [P.O.:240; I.V.:840; TPN:596.3] Out: 3600 [Urine:3600] Intake/Output this shift:    General appearance: alert and no distress Eyes: no scleral icterus Throat: oropharynx moist without erythema Resp: clear to auscultation bilaterally Cardio: regular rate and rhythm GI: soft, non-tender; hypoactive bowel sounds; no masses,  no organomegaly Extremities: no clubbing, cyanosis or edema   Lab Results:  Recent Labs  12/09/13 0342  NA 143  K 4.1  CL 108  CO2 25  GLUCOSE 114*  BUN 21  CREATININE 0.71  CALCIUM 8.8  MG 1.9  PHOS 3.6    Recent Labs  12/09/13 0342  AST 17  ALT 11  ALKPHOS 48  BILITOT <0.2*  PROT 5.9*  ALBUMIN 2.4*    Recent Labs  12/09/13 0342 12/10/13 0555 12/11/13 0355  WBC 12.2* 11.4* 12.1*  NEUTROABS 7.7  --   --   HGB 8.9* 8.6* 8.2*  HCT 28.4* 26.8* 25.5*  MCV 92.8 92.4 93.1  PLT 158 166 159   Lab Results  Component Value Date   INR 1.10 02/25/2011   No results found for this basename: CKTOTAL, CKMB, CKMBINDEX, TROPONINI,  in the last 72 hours No results found for this basename: TSH, T4TOTAL, FREET3, T3FREE, THYROIDAB,  in the last 72 hours  Recent Labs  12/09/13 0840  VITAMINB12 1093*  FOLATE 8.6  FERRITIN 126  TIBC 274  IRON 41*  RETICCTPCT 1.2    Studies/Results: Dg Abd 1 View  12/09/2013   CLINICAL DATA:  Abdominal pain, nausea, vomiting, history of gastric neoplasia  EXAM: ABDOMEN  - 1 VIEW  COMPARISON:  DG ABD 2 VIEWS dated 12/03/2013  FINDINGS: Air is seen within nondilated loops of large small bowel. Surgical clips identified within the right upper quadrant and a skin staple in the left upper quadrant. Dystrophic calcifications project in the region of the pelvis, likely reflecting a degenerating fibroid. The patient is status post total left hip arthroplasty visualized portions appear intact. S-shaped scoliosis identified within the thoracolumbar spine is of the severe multilevel spondylosis.  IMPRESSION: Nonobstructive bowel gas pattern.   Electronically Signed   By: Salome HolmesHector  Cooper M.D.   On: 12/09/2013 15:15   Dg Ugi W/small Bowel  12/10/2013   CLINICAL DATA:  Nausea and vomiting status post exploratory laparotomy and small bowel resection for obstruction.  EXAM: UPPER GI SERIES WITH SMALL BOWEL FOLLOW-THROUGH  TECHNIQUE: Single contrast upper GI series using thin barium. Subsequently, serial images of the small bowel were obtained including spot views of the terminal ileum.  COMPARISON:  Abdominal radiographs 12/09/2013 and earlier. CT abdomen pelvis 11/24/2013.  FLUOROSCOPY TIME:  5 min 24 seconds  FINDINGS: Scout radiograph demonstrates multiple surgical clips in the right mid abdomen. Gas is present in scattered loops of nondilated small and large bowel without evidence of obstruction. Marked multilevel degenerative disc disease and dextroscoliosis are noted in the lumbar spine. Dystrophic calcification in the pelvis  is unchanged and likely represents a fibroid. Left total hip arthroplasty is partially visualized.  Evaluation of the esophagus demonstrated some initial release of the contrast bolus from the proximal wave with subsequent stasis in the esophagus. No definite tertiary contractions were seen. Two subsequent individual swallows demonstrated a more effective primary wave without significant dysmotility identified. No esophageal mass or stricture was identified. There is a  small sliding-type hiatal hernia. The stomach was normal in configuration without gross mass or obstruction identified. Duodenum was normally positioned.  Small bowel transit time was approximately 70 min. Evaluation of pelvic small bowel loops was mildly limited due to superimposition of loops upon one another and decreased patient mobility. However, some separation of pelvic small bowel loops was achieved with prone positioning and using the compression paddle. There is no evidence of small bowel obstruction. No fixed area of small bowel narrowing was identified. The terminal ileum was normal in appearance.  IMPRESSION: 1. Mild, nonspecific esophageal dysmotility. 2. Small sliding hiatal hernia. 3. No evidence of small bowel obstruction or stricture.   Electronically Signed   By: Sebastian Ache   On: 12/10/2013 15:10     Medications: Scheduled: . antiseptic oral rinse  15 mL Mouth Rinse q12n4p  . aspirin  325 mg Oral Daily  . chlorhexidine  15 mL Mouth Rinse BID  . feeding supplement (RESOURCE BREEZE)  1 Container Oral BID BM  . insulin aspart  0-9 Units Subcutaneous 4 times per day  . pantoprazole  40 mg Oral Daily   Continuous: . dextrose 5 % and 0.45 % NaCl with KCl 20 mEq/L 35 mL/hr at 12/10/13 2247  . Marland KitchenTPN (CLINIMIX-E) Adult 65 mL/hr at 12/10/13 2300   And  . fat emulsion 250 mL (12/10/13 1702)    Assessment/Plan: Principal Problem:  1. Small bowel obstruction with perforation s/p Ex Lap with bowel repair (POD #14). Unable to advance diet due to recurrent N/V. AXR shows nonobstructive pattern.  UGI with SBFT normal.  Started on Reglan. Continue TNA until tolerating pos though ?could it be contributing to N/V.  Will try to advance to full liquids today and have upright for meals. Active Problems:  2. Rectal Bleeding- isolated event may be related to slow bowel transit (?stercoral ulcer). If no further bleeding will simply monitor. If recurrent may need GI evaluation. Lovenox held but  remains on ASA due to history of CVA.  3. Anemia-Slight decrease in Hg but relatively stable.  Received IV iron X 1. 4. Severe protein calorie malnutrition-continue TNA until po intake improves. If nausea persists will hold TNA to see if it is contributing to nausea. 5. Acute on Chronic Renal Failure- resolved  6. 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 No behavioral issues.  7. History of CVA (cerebrovascular accident) with left hemiparesis- Aspirin restarted. BP reasonable (avoid hypotension due to prior CVA in setting of relative hypotension)  8. Disposition- anticipate discharge when tolerating diet- hopeful for discharge to SNF rehab in 2 days.  Will need to be off TNA for discharge to SNF.    LOS: 17 days   Lindsey Hensley 12/11/2013, 7:19 AM

## 2013-12-11 NOTE — Progress Notes (Signed)
[facility]  Patient seen and examined.  Son at bedside.  No complaints except for emesis after meals.  WBC persistently elevated.  Will get CT abdomen to rule out intra-abdominal pathology such as abscess.  If negative, will need GI consultation.  Velora Hecklerodd M. Jisella Ashenfelter, MD, [facility] Office: [phone]

## 2013-12-11 NOTE — Progress Notes (Signed)
Calorie Count Note  Intervention:  - Add Ensure Complete BID - TPN per pharmacy - Recommend MD consider appetite stimulant  - Will continue to monitor and analyze calorie count   Per RN, pt without any nausea/vomiting so far today. Was sipping on [employer] during RD visit. Since UGI yesterday showed no evidence of small obstruction, recommend transition from TPN to TF if inadequate oral intake persists. Discussed recommendations with NP, who stated the plan is to cut back on TPN today and monitor PO intake.   48 hour calorie count ordered, however will extend calorie count to continue to monitor PO intake.   Diet: Soft  Supplements: Resource Breeze BID  3/2 - Pt on soft diet  Dinner: 419 calories, 29g protein   3/3 - Clear liquid diet Per RN - pt vomited everything she tried to eat   Supplements: Per son, pt was able to drink 1 Ensure Complete in the evening (350 calories, 13g protein)  Total average intake from past 2 days: 385 kcal (23% of minimum estimated needs)  21g protein (28% of minimum estimated needs)  Nutrition Dx: Inadequate oral intake related to nausea/vomiting as evidenced by RN report - ongoing    Goal:  TPN to meet >90% of estimated nutritional needs - met Pt to consume >50% of meals/supplements - not met  TPN: Clinimix E 5/20 @ 65 + IV Fat Emulsion 20% at 10 ml/hr provides 1853 Kcal and 78 g protein per day which meets 100% of estimated nutritional needs    Mikey College MS, [place], [place] Pager [phone] After Hours Pager

## 2013-12-11 NOTE — Progress Notes (Signed)
Physical Therapy Treatment Patient Details Name: Lindsey CarnesRuth S Hensley MRN: [id number] DOB: [birth year] Today's Date: 12/11/2013 Time: 1445-1510 PT Time Calculation (min): 25 min  PT Assessment / Plan / Recommendation  History of Present Illness pt is s/p exploratory lap   she has a h/o CVA, dementia, L THA 6 months ago   PT Comments   Pt progressing; will need HHPT; may need incr supervision at home--was alone for 6-7hrs prior to hospitalization per son  Follow Up Recommendations  Home health PT (may need incr supervision for safety)     Does the patient have the potential to tolerate intense rehabilitation     Barriers to Discharge        Equipment Recommendations  None recommended by PT    Recommendations for Other Services    Frequency Min 3X/week   Progress towards PT Goals Progress towards PT goals: Progressing toward goals  Plan Current plan remains appropriate    Precautions / Restrictions Precautions Precautions: Fall Precaution Comments: dementia with short term memory loss   Pertinent Vitals/Pain Denies pain    Mobility  Bed Mobility Bed Mobility: Sit to Supine Supine to sit: Supervision Sit to supine:  (uses rail) General bed mobility comments: increased time and cues for task completion Transfers Overall transfer level: Needs assistance Equipment used: Rolling walker (2 wheeled) Transfers: Sit to/from [employer] CorporationStand;Stand Pivot Transfers Sit to Stand: Min guard Stand pivot transfers: Min guard General transfer comment: verbal and tactile cues for hand placement and safety  Ambulation/Gait Ambulation/Gait assistance: Min guard;Supervision Ambulation Distance (Feet): 120 Feet Assistive device: Rolling walker (2 wheeled) Gait Pattern/deviations: Step-through pattern;Trunk flexed Gait velocity: decreased General Gait Details: pt requires frequent cues for RW distance from self,  posture and safety; she often walks outside the walker during turns    Exercises Total Joint  Exercises Bridges: Strengthening;Both;10 reps General Exercises - Lower Extremity Quad Sets: Strengthening;Both;10 reps Gluteal Sets: Strengthening;Both;10 reps Heel Slides: Strengthening;Both;10 reps (with manual resistance)   PT Diagnosis:    PT Problem List:   PT Treatment Interventions:     PT Goals (current goals can now be found in the care plan section) Acute Rehab PT Goals Patient Stated Goal: none stated PT Goal Formulation: With patient/family Time For Goal Achievement: 12/25/13 Potential to Achieve Goals: Good  Visit Information  Last PT Received On: 12/11/13 Assistance Needed: +1 History of Present Illness: pt is s/p exploratory lap   she has a h/o CVA, dementia, L THA 6 months ago    Subjective Data  Patient Stated Goal: none stated   Cognition  Cognition Arousal/Alertness: Awake/alert Behavior During Therapy: WFL for tasks assessed/performed Overall Cognitive Status: History of cognitive impairments - at baseline Memory: Decreased short-term memory    Balance  Balance Standing balance support: During functional activity Standing balance-Leahy Scale: Fair  End of Session PT - End of Session Equipment Utilized During Treatment: Gait belt Activity Tolerance: Patient tolerated treatment well Patient left: in bed;with call bell/phone within reach   GP     [facility] HospitalWILLIAMS,Aayliah Rotenberry 12/11/2013, 3:16 PM

## 2013-12-12 ENCOUNTER — Inpatient Hospital Stay (HOSPITAL_COMMUNITY): Payer: Medicare HMO

## 2013-12-12 DIAGNOSIS — R112 Nausea with vomiting, unspecified: Secondary | ICD-10-CM

## 2013-12-12 LAB — CBC
HCT: 25.1 % — ABNORMAL LOW (ref 36.0–46.0)
Hemoglobin: 8.1 g/dL — ABNORMAL LOW (ref 12.0–15.0)
MCH: 29.7 pg (ref 26.0–34.0)
MCHC: 32.3 g/dL (ref 30.0–36.0)
MCV: 91.9 fL (ref 78.0–100.0)
Platelets: 161 10*3/uL (ref 150–400)
RBC: 2.73 MIL/uL — ABNORMAL LOW (ref 3.87–5.11)
RDW: 15.1 % (ref 11.5–15.5)
WBC: 13.9 10*3/uL — ABNORMAL HIGH (ref 4.0–10.5)

## 2013-12-12 LAB — GLUCOSE, CAPILLARY
Glucose-Capillary: 90 mg/dL (ref 70–99)
Glucose-Capillary: 97 mg/dL (ref 70–99)
Glucose-Capillary: 82 mg/dL (ref 70–99)

## 2013-12-12 NOTE — Progress Notes (Signed)
General Surgery [facility] [place]  Agree.  Will monitor oral intake and see how she tolerates diet today.  Encourage OOB, ambulation.  Velora Hecklerodd M. Nakhia Levitan, MD, [facility] Office: [phone]

## 2013-12-12 NOTE — Progress Notes (Signed)
Patient ID: Dorris CarnesRuth S Sinning, female   DOB: 10/18/1921, 80 y.o.   MRN: [id number]  Subjective: Son at bedside states she had n/v yesterday.  Doing okay this morning with ensure/breeze.  Pt appears comfortable.  NAD.  TNA off since 1AM  Objective:  Vital signs:  Filed Vitals:   12/11/13 1432 12/11/13 2048 12/12/13 0524 12/12/13 0857  BP: 105/62 104/50 117/57 117/55  Pulse: 95 100 93 91  Temp: 98.3 F (36.8 C) 98.9 F (37.2 C) 98.2 F (36.8 C) 97.9 F (36.6 C)  TempSrc: Oral Oral Oral Oral  Resp: 18 18 18 16   Height:      Weight:      SpO2: 94% 96% 92% 95%    Last BM Date: 12/09/13  Intake/Output   Yesterday:  03/04 0701 - 03/05 0700 In: 240 [P.O.:240] Out: 2200 [Urine:2200] This shift:    I/O last 3 completed shifts: In: 1396.3 [P.O.:240; I.V.:560] Out: 4300 [Urine:4300]    Physical Exam:  Abdomen: Soft. Nondistended. Midline wound dressing is c/d/i. No evidence of peritonitis. No incarcerated hernias.   Problem List:   Principal Problem:   SBO (small bowel obstruction) s/p SB resection Active Problems:   History of CVA (cerebrovascular accident)   Leukocytosis   Dementia in Alzheimer's disease with delirium   Acute on chronic renal failure   Hemiparesis affecting left side as late effect of cerebrovascular accident   Rectal bleeding    Results:   Labs: Results for orders placed during the hospital encounter of 11/24/13 (from the past 48 hour(s))  GLUCOSE, CAPILLARY     Status: Abnormal   Collection Time    12/10/13  1:56 PM      Result Value Ref Range   Glucose-Capillary 108 (*) 70 - 99 mg/dL  GLUCOSE, CAPILLARY     Status: Abnormal   Collection Time    12/10/13  6:47 PM      Result Value Ref Range   Glucose-Capillary 123 (*) 70 - 99 mg/dL  GLUCOSE, CAPILLARY     Status: Abnormal   Collection Time    12/10/13 11:25 PM      Result Value Ref Range   Glucose-Capillary 134 (*) 70 - 99 mg/dL  CBC     Status: Abnormal   Collection Time    12/11/13   3:55 AM      Result Value Ref Range   WBC 12.1 (*) 4.0 - 10.5 K/uL   RBC 2.74 (*) 3.87 - 5.11 MIL/uL   Hemoglobin 8.2 (*) 12.0 - 15.0 g/dL   HCT 56.225.5 (*) 13.036.0 - 86.546.0 %   MCV 93.1  78.0 - 100.0 fL   MCH 29.9  26.0 - 34.0 pg   MCHC 32.2  30.0 - 36.0 g/dL   RDW 78.414.9  69.611.5 - 29.515.5 %   Platelets 159  150 - 400 K/uL  GLUCOSE, CAPILLARY     Status: Abnormal   Collection Time    12/11/13  5:34 AM      Result Value Ref Range   Glucose-Capillary 120 (*) 70 - 99 mg/dL  GLUCOSE, CAPILLARY     Status: Abnormal   Collection Time    12/11/13 11:33 AM      Result Value Ref Range   Glucose-Capillary 119 (*) 70 - 99 mg/dL   Comment 1 Notify RN     Comment 2 Documented in Chart    GLUCOSE, CAPILLARY     Status: Abnormal   Collection Time    12/11/13  2:19  PM      Result Value Ref Range   Glucose-Capillary 116 (*) 70 - 99 mg/dL  GLUCOSE, CAPILLARY     Status: Abnormal   Collection Time    12/11/13  5:21 PM      Result Value Ref Range   Glucose-Capillary 126 (*) 70 - 99 mg/dL   Comment 1 Notify RN     Comment 2 Documented in Chart    GLUCOSE, CAPILLARY     Status: Abnormal   Collection Time    12/11/13 11:34 PM      Result Value Ref Range   Glucose-Capillary 114 (*) 70 - 99 mg/dL  CBC     Status: Abnormal   Collection Time    12/12/13  4:35 AM      Result Value Ref Range   WBC 13.9 (*) 4.0 - 10.5 K/uL   RBC 2.73 (*) 3.87 - 5.11 MIL/uL   Hemoglobin 8.1 (*) 12.0 - 15.0 g/dL   HCT 40.9 (*) 81.1 - 91.4 %   MCV 91.9  78.0 - 100.0 fL   MCH 29.7  26.0 - 34.0 pg   MCHC 32.3  30.0 - 36.0 g/dL   RDW 78.2  95.6 - 21.3 %   Platelets 161  150 - 400 K/uL  GLUCOSE, CAPILLARY     Status: None   Collection Time    12/12/13  7:29 AM      Result Value Ref Range   Glucose-Capillary 90  70 - 99 mg/dL   Comment 1 Notify RN     Comment 2 Documented in Chart      Imaging / Studies: Dg Ugi W/small Bowel  12/10/2013   CLINICAL DATA:  Nausea and vomiting status post exploratory laparotomy and small  bowel resection for obstruction.  EXAM: UPPER GI SERIES WITH SMALL BOWEL FOLLOW-THROUGH  TECHNIQUE: Single contrast upper GI series using thin barium. Subsequently, serial images of the small bowel were obtained including spot views of the terminal ileum.  COMPARISON:  Abdominal radiographs 12/09/2013 and earlier. CT abdomen pelvis 11/24/2013.  FLUOROSCOPY TIME:  5 min 24 seconds  FINDINGS: Scout radiograph demonstrates multiple surgical clips in the right mid abdomen. Gas is present in scattered loops of nondilated small and large bowel without evidence of obstruction. Marked multilevel degenerative disc disease and dextroscoliosis are noted in the lumbar spine. Dystrophic calcification in the pelvis is unchanged and likely represents a fibroid. Left total hip arthroplasty is partially visualized.  Evaluation of the esophagus demonstrated some initial release of the contrast bolus from the proximal wave with subsequent stasis in the esophagus. No definite tertiary contractions were seen. Two subsequent individual swallows demonstrated a more effective primary wave without significant dysmotility identified. No esophageal mass or stricture was identified. There is a small sliding-type hiatal hernia. The stomach was normal in configuration without gross mass or obstruction identified. Duodenum was normally positioned.  Small bowel transit time was approximately 70 min. Evaluation of pelvic small bowel loops was mildly limited due to superimposition of loops upon one another and decreased patient mobility. However, some separation of pelvic small bowel loops was achieved with prone positioning and using the compression paddle. There is no evidence of small bowel obstruction. No fixed area of small bowel narrowing was identified. The terminal ileum was normal in appearance.  IMPRESSION: 1. Mild, nonspecific esophageal dysmotility. 2. Small sliding hiatal hernia. 3. No evidence of small bowel obstruction or stricture.    Electronically Signed   By: Sebastian Ache  On: 12/10/2013 15:10    Medications / Allergies: per chart  Antibiotics: Anti-infectives   Start     Dose/Rate Route Frequency Ordered Stop   11/28/13 0000  piperacillin-tazobactam (ZOSYN) IVPB 3.375 g  Status:  Discontinued     3.375 g 12.5 mL/hr over 240 Minutes Intravenous Every 8 hours 11/27/13 1623 12/05/13 1234   11/27/13 1500  piperacillin-tazobactam (ZOSYN) IVPB 3.375 g     3.375 g 100 mL/hr over 30 Minutes Intravenous  Once 11/27/13 1443 11/27/13 1445   11/25/13 2359  [MAR Hold]  ceFEPIme (MAXIPIME) 1 g in dextrose 5 % 50 mL IVPB  Status:  Discontinued     (On MAR Hold since 11/27/13 1345)   1 g 100 mL/hr over 30 Minutes Intravenous Every 24 hours 11/25/13 2300 11/27/13 1633   11/24/13 1800  ampicillin-sulbactam (UNASYN) 1.5 g in sodium chloride 0.9 % 50 mL IVPB  Status:  Discontinued     1.5 g 100 mL/hr over 30 Minutes Intravenous Every 12 hours 11/24/13 1549 11/25/13 0956      Assessment/Plan  DIAGNOSTIC LAPAROSCOPY, OPEN RESECTION OF SMALL BOWEL - 11/27/2013 - Dr. Ovidio Kin - for closed loop small bowel obstruction with perforation  PCM  POD #15  -UGI with SBFT normal. Reported n/v yesterday. Drinking supplements this AM without symptoms.  CT not yet completed. -TPN stopped in case this is contributing to symptoms--off since 1AM -Zosyn course completed 2/18-2/27.  -up to chair with meals, walk as tolerated  -PT evaluation to help with mobility  -BID wet to dry dressing changes  DVT prophylaxis - Lovenox  Dementia / Short term memory loss  History of stroke - history of right frontal stroke with left-sided hemiparesis resulting in gait instability  HTN-stable Anemia-stable, iron per primary team  Ashok Norris, [facility] Surgery Pager [phone] Office [phone]  12/12/2013 10:12 AM

## 2013-12-12 NOTE — Progress Notes (Signed)
Was rounding on pts, noticed Pt's TNA bag was nearly empty, paged IV team nurse, who removed TNA and lipids per orders she received directly from pharmacy.

## 2013-12-12 NOTE — Progress Notes (Signed)
Nutrition Brief Note  Per conversation with RN, pt with multiple episodes of brown emesis yesterday. Pt NPO. Will d/c calorie count. Please re-order if needed. Suspect pt's intake will not improve substationally soon and recommend TF as pt nonobstructive gas pattern. Could start at trickle TF to assess tolerance. Will continue to monitor.   Levon HedgerHeather Baron MS, RD, LDN [phone] Pager [phone] After Hours Pager

## 2013-12-12 NOTE — Progress Notes (Signed)
Dr. Gerrit FriendsGerkin aware pt has nausea and vomiting following advancement to full liquid diet. See new orders received.

## 2013-12-12 NOTE — Progress Notes (Signed)
Occupational Therapy Treatment Patient Details Name: Lindsey CarnesRuth S Hensley MRN: [id number] DOB: 09-Aug-1922 Today's Date: 12/12/2013 Time: [id number] OT Time Calculation (min): 18 min  OT Assessment / Plan / Recommendation  History of present illness pt is s/p exploratory lap   she has a h/o CVA, dementia, L THA 6 months ago   OT comments  Worked on safety with the walker for toilet transfers this session also. Pt tends to push walker too far out in front and also is quick with turns. She complains of dizziness during session but improved by end of session. States "just a little bit" when OT left.    Follow Up Recommendations  Supervision/Assistance - 24 hour    Barriers to Discharge       Equipment Recommendations  3 in 1 bedside comode    Recommendations for Other Services    Frequency Min 2X/week   Progress towards OT Goals Progress towards OT goals: Progressing toward goals  Plan Discharge plan remains appropriate    Precautions / Restrictions Precautions Precautions: Fall Precaution Comments: dementia with short term memory loss Restrictions Weight Bearing Restrictions: No   Pertinent Vitals/Pain No complaint of pain    ADL  Toilet Transfer: Performed;Minimal assistance Toilet Transfer Method: Sit to stand;Stand pivot Toileting - Clothing Manipulation and Hygiene: Simulated;Minimal assistance Where Assessed - Toileting Clothing Manipulation and Hygiene: Sit to stand from 3-in-1 or toilet Equipment Used: Rolling walker ADL Comments: Pt reports feeling dizzy with sitting up and states it is improved with time but noticeable per pt report throughout session. By the end of session pt states it is mostly gone. She needs constant reminders for safety with the walker as she tends to push it too far out in front of her and makes very quick turns with her walker and decreased awareness of her IV line.     OT Diagnosis:    OT Problem List:   OT Treatment Interventions:     OT  Goals(current goals can now be found in the care plan section)    Visit Information  Last OT Received On: 12/12/13 Assistance Needed: +1 History of Present Illness: pt is s/p exploratory lap   she has a h/o CVA, dementia, L THA 6 months ago    Subjective Data      Prior Functioning       Cognition  Cognition Arousal/Alertness: Awake/alert Behavior During Therapy: WFL for tasks assessed/performed Overall Cognitive Status: History of cognitive impairments - at baseline Memory: Decreased short-term memory    Mobility  Bed Mobility Bed Mobility: Supine to Sit Supine to sit: Supervision;HOB elevated Sit to supine: Supervision;HOB elevated Transfers Overall transfer level: Needs assistance Equipment used: Rolling walker (2 wheeled) Transfers: Sit to/from Stand Sit to Stand: Min guard Stand pivot transfers: Min guard General transfer comment: verbal and tactile cues for hand placement and safety     Exercises      Balance    End of Session OT - End of Session Equipment Utilized During Treatment: Rolling walker Activity Tolerance: Patient tolerated treatment well Patient left: in bed;with call bell/phone within reach  GO     Lennox LaityStone, Pietro Bonura [place] [phone] 12/12/2013, 1:24 PM

## 2013-12-12 NOTE — Progress Notes (Signed)
Subjective: No complaints- mild abdominal soreness.  Questions why she has dressing on abdomen.  Continued to have N/V with liquid diet yesterday.  CT ordered but not done yet.  Objective: Vital signs in last 24 hours: Temp:  [98.2 F (36.8 C)-98.9 F (37.2 C)] 98.2 F (36.8 C) (03/05 0524) Pulse Rate:  [93-100] 93 (03/05 0524) Resp:  [18] 18 (03/05 0524) BP: (104-117)/(50-62) 117/57 mmHg (03/05 0524) SpO2:  [92 %-96 %] 92 % (03/05 0524) Weight change:  Last BM Date: 12/09/13  CBG (last 3)   Recent Labs  12/11/13 1721 12/11/13 2334 12/12/13 0729  GLUCAP 126* 114* 90    Intake/Output from previous day: 03/04 0701 - 03/05 0700 In: 240 [P.O.:240] Out: 2200 [Urine:2200] Intake/Output this shift:    General appearance: alert and no distress Eyes: no scleral icterus Throat: oropharynx moist without erythema Resp: clear to auscultation bilaterally Cardio: regular rate and rhythm GI: soft, minimal tenderness; bowel sounds normal; no masses,  no organomegaly Extremities: no clubbing, cyanosis or edema   Lab Results: No results found for this basename: NA, K, CL, CO2, GLUCOSE, BUN, CREATININE, CALCIUM, MG, PHOS,  in the last 72 hours No results found for this basename: AST, ALT, ALKPHOS, BILITOT, PROT, ALBUMIN,  in the last 72 hours  Recent Labs  12/11/13 0355 12/12/13 0435  WBC 12.1* 13.9*  HGB 8.2* 8.1*  HCT 25.5* 25.1*  MCV 93.1 91.9  PLT 159 161   Lab Results  Component Value Date   INR 1.10 02/25/2011   No results found for this basename: CKTOTAL, CKMB, CKMBINDEX, TROPONINI,  in the last 72 hours No results found for this basename: TSH, T4TOTAL, FREET3, T3FREE, THYROIDAB,  in the last 72 hours  Recent Labs  12/09/13 0840  VITAMINB12 1093*  FOLATE 8.6  FERRITIN 126  TIBC 274  IRON 41*  RETICCTPCT 1.2    Studies/Results: Dg Ugi W/small Bowel  12/10/2013   CLINICAL DATA:  Nausea and vomiting status post exploratory laparotomy and small bowel resection  for obstruction.  EXAM: UPPER GI SERIES WITH SMALL BOWEL FOLLOW-THROUGH  TECHNIQUE: Single contrast upper GI series using thin barium. Subsequently, serial images of the small bowel were obtained including spot views of the terminal ileum.  COMPARISON:  Abdominal radiographs 12/09/2013 and earlier. CT abdomen pelvis 11/24/2013.  FLUOROSCOPY TIME:  5 min 24 seconds  FINDINGS: Scout radiograph demonstrates multiple surgical clips in the right mid abdomen. Gas is present in scattered loops of nondilated small and large bowel without evidence of obstruction. Marked multilevel degenerative disc disease and dextroscoliosis are noted in the lumbar spine. Dystrophic calcification in the pelvis is unchanged and likely represents a fibroid. Left total hip arthroplasty is partially visualized.  Evaluation of the esophagus demonstrated some initial release of the contrast bolus from the proximal wave with subsequent stasis in the esophagus. No definite tertiary contractions were seen. Two subsequent individual swallows demonstrated a more effective primary wave without significant dysmotility identified. No esophageal mass or stricture was identified. There is a small sliding-type hiatal hernia. The stomach was normal in configuration without gross mass or obstruction identified. Duodenum was normally positioned.  Small bowel transit time was approximately 70 min. Evaluation of pelvic small bowel loops was mildly limited due to superimposition of loops upon one another and decreased patient mobility. However, some separation of pelvic small bowel loops was achieved with prone positioning and using the compression paddle. There is no evidence of small bowel obstruction. No fixed area of small bowel narrowing  was identified. The terminal ileum was normal in appearance.  IMPRESSION: 1. Mild, nonspecific esophageal dysmotility. 2. Small sliding hiatal hernia. 3. No evidence of small bowel obstruction or stricture.   Electronically  Signed   By: Sebastian Ache   On: 12/10/2013 15:10     Medications: Scheduled: . antiseptic oral rinse  15 mL Mouth Rinse q12n4p  . aspirin  325 mg Oral Daily  . chlorhexidine  15 mL Mouth Rinse BID  . feeding supplement (ENSURE COMPLETE)  237 mL Oral BID BM  . feeding supplement (RESOURCE BREEZE)  1 Container Oral BID BM  . insulin aspart  0-5 Units Subcutaneous QHS  . insulin aspart  0-9 Units Subcutaneous TID WC  . pantoprazole  40 mg Oral Daily   Continuous: . sodium chloride 50 mL/hr at 12/11/13 1601    Assessment/Plan: Principal Problem:  1. Small bowel obstruction with perforation s/p Ex Lap with bowel repair (POD #15). Unable to advance diet due to recurrent N/V. AXR shows nonobstructive pattern. UGI with SBFT normal. Started on Reglan. CT ordered today to rule out abscess.  If negative will try again to advance diet (TNA discontinued in the event that it was contributing).  GI consult if persistent. Active Problems:  2. Rectal Bleeding- isolated event may be related to slow bowel transit (?stercoral ulcer). No further bleeding noted.  Lovenox held but remains on ASA due to history of CVA.  3. Anemia-Hg relatively stable. Received IV iron X 1.  4. Severe protein calorie malnutrition-TNA held to see if it is contributing to nausea  5. Acute on Chronic Renal Failure- resolved  6. 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 No behavioral issues.  7. History of CVA (cerebrovascular accident) with left hemiparesis- Aspirin restarted. BP reasonable (avoid hypotension due to prior CVA in setting of relative hypotension)  8. Disposition- anticipate discharge to SNF when tolerating diet. Will need to be off TNA for discharge to SNF.     LOS: 18 days   Martha Clan 12/12/2013, 8:20 AM

## 2013-12-13 ENCOUNTER — Inpatient Hospital Stay (HOSPITAL_COMMUNITY): Payer: Medicare HMO

## 2013-12-13 DIAGNOSIS — R110 Nausea: Secondary | ICD-10-CM

## 2013-12-13 LAB — URINALYSIS, ROUTINE W REFLEX MICROSCOPIC
Bilirubin Urine: NEGATIVE
Glucose, UA: NEGATIVE mg/dL
HGB URINE DIPSTICK: NEGATIVE
Ketones, ur: NEGATIVE mg/dL
Leukocytes, UA: NEGATIVE
NITRITE: NEGATIVE
Protein, ur: NEGATIVE mg/dL
SPECIFIC GRAVITY, URINE: 1.008 (ref 1.005–1.030)
UROBILINOGEN UA: 0.2 mg/dL (ref 0.0–1.0)
pH: 7 (ref 5.0–8.0)

## 2013-12-13 LAB — CBC
HCT: 26 % — ABNORMAL LOW (ref 36.0–46.0)
HEMOGLOBIN: 8.5 g/dL — AB (ref 12.0–15.0)
MCH: 30 pg (ref 26.0–34.0)
MCHC: 32.7 g/dL (ref 30.0–36.0)
MCV: 91.9 fL (ref 78.0–100.0)
PLATELETS: 172 10*3/uL (ref 150–400)
RBC: 2.83 MIL/uL — ABNORMAL LOW (ref 3.87–5.11)
RDW: 15.3 % (ref 11.5–15.5)
WBC: 13.7 10*3/uL — AB (ref 4.0–10.5)

## 2013-12-13 LAB — GLUCOSE, CAPILLARY
GLUCOSE-CAPILLARY: 94 mg/dL (ref 70–99)
Glucose-Capillary: 91 mg/dL (ref 70–99)
Glucose-Capillary: 74 mg/dL (ref 70–99)
Glucose-Capillary: 84 mg/dL (ref 70–99)

## 2013-12-13 MED ORDER — PEG 3350-KCL-NA BICARB-NACL 420 G PO SOLR
2000.0000 mL | Freq: Once | ORAL | Status: AC
  Administered 2013-12-13: 2000 mL via ORAL

## 2013-12-13 MED ORDER — IOHEXOL 300 MG/ML  SOLN
25.0000 mL | Freq: Once | INTRAMUSCULAR | Status: AC | PRN
  Administered 2013-12-13: 25 mL via ORAL

## 2013-12-13 MED ORDER — IOHEXOL 300 MG/ML  SOLN
80.0000 mL | Freq: Once | INTRAMUSCULAR | Status: AC | PRN
  Administered 2013-12-13: 80 mL via INTRAVENOUS

## 2013-12-13 MED ORDER — FLEET ENEMA 7-19 GM/118ML RE ENEM
1.0000 | ENEMA | Freq: Once | RECTAL | Status: AC
  Administered 2013-12-13: 1 via RECTAL
  Filled 2013-12-13: qty 1

## 2013-12-13 NOTE — Progress Notes (Signed)
General Surgery [facility]  Patient down for CT scan now.  Agree with attached note.  Velora Hecklerodd M. Hildy Nicholl, MD, [facility] Office: [phone]

## 2013-12-13 NOTE — Progress Notes (Signed)
CSW met with pt / family at bedside. No d/c order at this time. [facility] has been updated. SNF expects to have an opening on Monday if pt is stable for d/c. Tentative d/c summary is needed today for SNF if pt is to be d/c to [facility]  over the weekend. NSG will alert MD. CSW will continue to follow to assist with d/c planning needs.  Werner Lean LCSW [phone]

## 2013-12-13 NOTE — Progress Notes (Signed)
Subjective: Continued to have N/V after full liquids at lunch.  Changed to clear liquids at dinner which she tolerated.  RN reports complaints of burning after urination.  Urine ordered but not obtained.  CT delayed due to retained barium.  Objective: Vital signs in last 24 hours: Temp:  [97.9 F (36.6 C)-99.7 F (37.6 C)] 98.5 F (36.9 C) (03/06 0535) Pulse Rate:  [84-92] 92 (03/06 0535) Resp:  [16-20] 16 (03/05 2230) BP: (116-133)/(55-70) 122/62 mmHg (03/06 0535) SpO2:  [94 %-97 %] 97 % (03/06 0535) Weight:  [59.109 kg (130 lb 5 oz)] 59.109 kg (130 lb 5 oz) (03/06 0500) Weight change:  Last BM Date: 12/09/13  CBG (last 3)   Recent Labs  12/12/13 0729 12/12/13 1219 12/12/13 2204  GLUCAP 90 97 82    Intake/Output from previous day: 03/05 0701 - 03/06 0700 In: 2329.2 [P.O.:600; I.V.:1729.2] Out: 900 [Urine:900] Intake/Output this shift:    General appearance: alert and no distress Eyes: no scleral icterus Throat: oropharynx moist without erythema Resp: clear to auscultation bilaterally Cardio: regular rate and rhythm GI: soft, non-tender; bowel sounds normal; no masses,  no organomegaly Extremities: no clubbing, cyanosis or edema   Lab Results: No results found for this basename: NA, K, CL, CO2, GLUCOSE, BUN, CREATININE, CALCIUM, MG, PHOS,  in the last 72 hours No results found for this basename: AST, ALT, ALKPHOS, BILITOT, PROT, ALBUMIN,  in the last 72 hours  Recent Labs  12/12/13 0435 12/13/13 0455  WBC 13.9* 13.7*  HGB 8.1* 8.5*  HCT 25.1* 26.0*  MCV 91.9 91.9  PLT 161 172   Lab Results  Component Value Date   INR 1.10 02/25/2011   No results found for this basename: CKTOTAL, CKMB, CKMBINDEX, TROPONINI,  in the last 72 hours No results found for this basename: TSH, T4TOTAL, FREET3, T3FREE, THYROIDAB,  in the last 72 hours No results found for this basename: VITAMINB12, FOLATE, FERRITIN, TIBC, IRON, RETICCTPCT,  in the last 72  hours  Studies/Results: Dg Abd 1 View  12/12/2013   CLINICAL DATA:  eval for residual barium; pre ct a/p scan  EXAM: ABDOMEN - 1 VIEW  COMPARISON:  CT ABD/PELVIS W CM dated 12/11/2013, topogram  FINDINGS: Areas of residual dense barium project in the region of the cecum/ proximal ascending colon, mid transverse colon and throughout the descending colon and sigmoid colon regions. Air is seen within nondilated loops of small bowel. There is S-shaped scoliosis of the thoracolumbar spine and multilevel degenerative disc disease changes within the lumbar spine.  IMPRESSION: Residual areas of dense barium within the colon. Nonobstructive bowel gas pattern. The distribution and quantity of barium would degrade CT evaluation. Surveillance evaluation is recommended.   Electronically Signed   By: Salome HolmesHector  Cooper M.D.   On: 12/12/2013 10:47     Medications: Scheduled: . antiseptic oral rinse  15 mL Mouth Rinse q12n4p  . aspirin  325 mg Oral Daily  . chlorhexidine  15 mL Mouth Rinse BID  . feeding supplement (ENSURE COMPLETE)  237 mL Oral BID BM  . feeding supplement (RESOURCE BREEZE)  1 Container Oral BID BM  . insulin aspart  0-5 Units Subcutaneous QHS  . insulin aspart  0-9 Units Subcutaneous TID WC  . pantoprazole  40 mg Oral Daily   Continuous: . sodium chloride 50 mL/hr at 12/13/13 [id number]    Assessment/Plan: Principal Problem:  1. Small bowel obstruction with perforation s/p Ex Lap with bowel repair (POD #16). Unable to advance diet due to  recurrent N/V. AXR shows nonobstructive pattern. UGI with SBFT normal. Started on Reglan. CT ordered to rule out abscess/intraabdominal cause (delayed by retained barium). If negative, consider GI consult if persistent.  Active Problems:  2. Leukocytosis- completed 7 days of Zosyn post-op but WBC trending back up.  CT and urinalysis ordered.   3. Rectal Bleeding- isolated event may be related to slow bowel transit (?stercoral ulcer). No further bleeding noted.  Lovenox held but remains on ASA due to history of CVA.  4. Anemia-Hg stable. Received IV iron X 1.  5. Severe protein calorie malnutrition-TNA held to see if it is contributing to nausea.  Consider restarting if unable to advance diet. 6. Acute on Chronic Renal Failure- resolved  7. 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 No behavioral issues.  8. History of CVA (cerebrovascular accident) with left hemiparesis- Aspirin restarted. BP OK (avoid hypotension due to prior CVA in setting of relative hypotension)  9. Disposition- anticipate discharge to SNF when tolerating diet. Will need to be off TNA for discharge to SNF.    LOS: 19 days   Martha Clan 12/13/2013, 7:16 AM

## 2013-12-13 NOTE — Progress Notes (Signed)
Physical Therapy Treatment Patient Details Name: Lindsey CarnesRuth S Hiser MRN: [id number] DOB: December 03, 1921 Today's Date: 12/13/2013 Time: [id number] PT Time Calculation (min): 13 min  PT Assessment / Plan / Recommendation  History of Present Illness pt is s/p exploratory lap   she has a h/o CVA, dementia, L THA 6 months ago.  Poor ST memory.   PT Comments   Pt OOB in recliner with son in room.  Assisted to BR then amb in hallway.  Pt required VC's for proper walker use as pt is not use to using an AD.     Follow Up Recommendations  Home health PT     Does the patient have the potential to tolerate intense rehabilitation     Barriers to Discharge        Equipment Recommendations  None recommended by PT    Recommendations for Other Services    Frequency Min 3X/week   Progress towards PT Goals Progress towards PT goals: Progressing toward goals  Plan      Precautions / Restrictions Precautions Precautions: Fall Precaution Comments: dementia with short term memory loss Restrictions Weight Bearing Restrictions: No   Pertinent Vitals/Pain     Mobility  Bed Mobility General bed mobility comments: Pt OOB in recliner Transfers Overall transfer level: Needs assistance Equipment used: Rolling walker (2 wheeled) Transfers: Sit to/from Stand Sit to Stand: Min guard General transfer comment: verbal and tactile cues for hand placement and safety  Ambulation/Gait Ambulation/Gait assistance: Min guard;Supervision Ambulation Distance (Feet): 125 Feet Assistive device: Rolling walker (2 wheeled) Gait Pattern/deviations: Step-through pattern;Trunk flexed Gait velocity: decreased General Gait Details: pt requires frequent cues for RW distance from self,  posture and safety; she often walks outside the walker during turns     PT Goals (current goals can now be found in the care plan section)    Visit Information  Last PT Received On: 12/13/13 Assistance Needed: +1 History of Present Illness:  pt is s/p exploratory lap   she has a h/o CVA, dementia, L THA 6 months ago.  Poor ST memory.    Subjective Data      Cognition       Balance     End of Session PT - End of Session Equipment Utilized During Treatment: Gait belt Activity Tolerance: Patient tolerated treatment well Patient left: in chair;with call bell/phone within reach;with family/visitor present   Felecia ShellingLori Abdelrahman Nair  PTA [facility]  Acute  Rehab Pager      [phone]

## 2013-12-13 NOTE — Progress Notes (Signed)
Patient ID: Dorris CarnesRuth S Siefert, female   DOB: 1922/01/03, 80 y.o.   MRN: [id number]   Subjective: Continues to have n/v.  Able to keep small sips and meds down.  Has BMs.    Objective:  Vital signs:  Filed Vitals:   12/12/13 1414 12/12/13 2230 12/13/13 0500 12/13/13 0535  BP: 116/68 133/70  122/62  Pulse: 84 92  92  Temp: 98.8 F (37.1 C) 99.7 F (37.6 C)  98.5 F (36.9 C)  TempSrc: Oral Oral  Oral  Resp: 20 16    Height:      Weight:   130 lb 5 oz (59.109 kg)   SpO2: 95% 94%  97%    Last BM Date: 12/09/13  Intake/Output   Yesterday:  03/05 0701 - 03/06 0700 In: 2329.2 [P.O.:600; I.V.:1729.2] Out: 900 [Urine:900] This shift:  Total I/O In: 120 [P.O.:120] Out: 400 [Urine:400]  Physical Exam: General: Pt awake/alert to person and place.   Chest: cta.  No chest wall pain w good excursion CV:  Pulses intact.  Regular rhythm MS: Normal AROM mjr joints.  No obvious deformity Abdomen: Soft.  Nondistended.  Non tender.  Midline wound is healing well, beefy red, clean, packing replaced.   No evidence of peritonitis.  No incarcerated hernias. Ext:  SCDs BLE.  No mjr edema.  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   Acute on chronic renal failure   Hemiparesis affecting left side as late effect of cerebrovascular accident   Rectal bleeding    Results:   Labs: Results for orders placed during the hospital encounter of 11/24/13 (from the past 48 hour(s))  GLUCOSE, CAPILLARY     Status: Abnormal   Collection Time    12/11/13 11:33 AM      Result Value Ref Range   Glucose-Capillary 119 (*) 70 - 99 mg/dL   Comment 1 Notify RN     Comment 2 Documented in Chart    GLUCOSE, CAPILLARY     Status: Abnormal   Collection Time    12/11/13  2:19 PM      Result Value  Ref Range   Glucose-Capillary 116 (*) 70 - 99 mg/dL  GLUCOSE, CAPILLARY     Status: Abnormal   Collection Time    12/11/13  5:21 PM      Result Value Ref Range   Glucose-Capillary 126 (*) 70 - 99 mg/dL   Comment 1 Notify RN     Comment 2 Documented in Chart    GLUCOSE, CAPILLARY     Status: Abnormal   Collection Time    12/11/13 11:34 PM      Result Value Ref Range   Glucose-Capillary 114 (*) 70 - 99 mg/dL  CBC     Status: Abnormal   Collection Time    12/12/13  4:35 AM      Result Value Ref Range   WBC 13.9 (*) 4.0 - 10.5 K/uL   RBC 2.73 (*) 3.87 - 5.11 MIL/uL   Hemoglobin 8.1 (*) 12.0 - 15.0 g/dL   HCT 40.925.1 (*) 81.136.0 - 91.446.0 %   MCV 91.9  78.0 - 100.0 fL   MCH 29.7  26.0 - 34.0 pg   MCHC 32.3  30.0 - 36.0 g/dL   RDW 78.215.1  95.611.5 -  15.5 %   Platelets 161  150 - 400 K/uL  GLUCOSE, CAPILLARY     Status: None   Collection Time    12/12/13  7:29 AM      Result Value Ref Range   Glucose-Capillary 90  70 - 99 mg/dL   Comment 1 Notify RN     Comment 2 Documented in Chart    GLUCOSE, CAPILLARY     Status: None   Collection Time    12/12/13 12:19 PM      Result Value Ref Range   Glucose-Capillary 97  70 - 99 mg/dL  GLUCOSE, CAPILLARY     Status: None   Collection Time    12/12/13 10:04 PM      Result Value Ref Range   Glucose-Capillary 82  70 - 99 mg/dL  CBC     Status: Abnormal   Collection Time    12/13/13  4:55 AM      Result Value Ref Range   WBC 13.7 (*) 4.0 - 10.5 K/uL   RBC 2.83 (*) 3.87 - 5.11 MIL/uL   Hemoglobin 8.5 (*) 12.0 - 15.0 g/dL   HCT 16.1 (*) 09.6 - 04.5 %   MCV 91.9  78.0 - 100.0 fL   MCH 30.0  26.0 - 34.0 pg   MCHC 32.7  30.0 - 36.0 g/dL   RDW 40.9  81.1 - 91.4 %   Platelets 172  150 - 400 K/uL  GLUCOSE, CAPILLARY     Status: None   Collection Time    12/13/13  7:39 AM      Result Value Ref Range   Glucose-Capillary 91  70 - 99 mg/dL  URINALYSIS, ROUTINE W REFLEX MICROSCOPIC     Status: Abnormal   Collection Time    12/13/13  8:32 AM       Result Value Ref Range   Color, Urine YELLOW  YELLOW   APPearance HAZY (*) CLEAR   Specific Gravity, Urine 1.008  1.005 - 1.030   pH 7.0  5.0 - 8.0   Glucose, UA NEGATIVE  NEGATIVE mg/dL   Hgb urine dipstick NEGATIVE  NEGATIVE   Bilirubin Urine NEGATIVE  NEGATIVE   Ketones, ur NEGATIVE  NEGATIVE mg/dL   Protein, ur NEGATIVE  NEGATIVE mg/dL   Urobilinogen, UA 0.2  0.0 - 1.0 mg/dL   Nitrite NEGATIVE  NEGATIVE   Leukocytes, UA NEGATIVE  NEGATIVE   Comment: MICROSCOPIC NOT DONE ON URINES WITH NEGATIVE PROTEIN, BLOOD, LEUKOCYTES, NITRITE, OR GLUCOSE <1000 mg/dL.    Imaging / Studies: Dg Abd 1 View  12/13/2013   CLINICAL DATA:  Residual barium.  EXAM: ABDOMEN - 1 VIEW  COMPARISON:  December 12, 2013.  FINDINGS: No abnormal bowel gas pattern is noted. The large amount of residual barium seen in left colon on prior exam is no longer visualized. Small amount of residual contrast is seen in the region of the cecum. Severe dextroscoliosis of thoracic spine is noted. Status post left hip arthroplasty.  IMPRESSION: No evidence of bowel obstruction or ileus is noted. Large amount of residual barium seen in left colon on prior exam is no longer present. Small amount of residual barium remains in the cecum.   Electronically Signed   By: Roque Lias M.D.   On: 12/13/2013 08:40   Dg Abd 1 View  12/12/2013   CLINICAL DATA:  eval for residual barium; pre ct a/p scan  EXAM: ABDOMEN - 1 VIEW  COMPARISON:  CT ABD/PELVIS W CM dated  12/11/2013, topogram  FINDINGS: Areas of residual dense barium project in the region of the cecum/ proximal ascending colon, mid transverse colon and throughout the descending colon and sigmoid colon regions. Air is seen within nondilated loops of small bowel. There is S-shaped scoliosis of the thoracolumbar spine and multilevel degenerative disc disease changes within the lumbar spine.  IMPRESSION: Residual areas of dense barium within the colon. Nonobstructive bowel gas pattern. The distribution  and quantity of barium would degrade CT evaluation. Surveillance evaluation is recommended.   Electronically Signed   By: Salome Holmes M.D.   On: 12/12/2013 10:47    Medications / Allergies: per chart  Antibiotics: Anti-infectives   Start     Dose/Rate Route Frequency Ordered Stop   11/28/13 0000  piperacillin-tazobactam (ZOSYN) IVPB 3.375 g  Status:  Discontinued     3.375 g 12.5 mL/hr over 240 Minutes Intravenous Every 8 hours 11/27/13 1623 12/05/13 1234   11/27/13 1500  piperacillin-tazobactam (ZOSYN) IVPB 3.375 g     3.375 g 100 mL/hr over 30 Minutes Intravenous  Once 11/27/13 1443 11/27/13 1445   11/25/13 2359  [MAR Hold]  ceFEPIme (MAXIPIME) 1 g in dextrose 5 % 50 mL IVPB  Status:  Discontinued     (On MAR Hold since 11/27/13 1345)   1 g 100 mL/hr over 30 Minutes Intravenous Every 24 hours 11/25/13 2300 11/27/13 1633   11/24/13 1800  ampicillin-sulbactam (UNASYN) 1.5 g in sodium chloride 0.9 % 50 mL IVPB  Status:  Discontinued     1.5 g 100 mL/hr over 30 Minutes Intravenous Every 12 hours 11/24/13 1549 11/25/13 0956      Assessment/Plan  DIAGNOSTIC LAPAROSCOPY, OPEN RESECTION OF SMALL BOWEL - 11/27/2013 - Dr. Ovidio Kin - for closed loop small bowel obstruction with perforation  PCM  POD #16 -UGI with SBFT normal.  AXR negative.  Still unable to obtain a CT due to barium in the cecum.  Give 1/2 golytely and an enema.  Repeat AXR later today and evaluate if ready to proceed with the test. If CT is unrevealing, proceed with a GI consultation.  -TPN stopped in case this is contributing to symptoms--does not appear to be making a difference. -Zosyn course completed 2/18-2/27. White count remains a bit elevated.  Ct to evaluate for an abscess. -up to chair with meals, walk as tolerated  -PT evaluation to help with mobility  -BID wet to dry dressing changes  DVT prophylaxis - Lovenox  Dementia / Short term memory loss  History of stroke - history of right frontal stroke with  left-sided hemiparesis resulting in gait instability  HTN-stable   Anemia-stable  Ashok Norris, [facility] Surgery Pager [phone] Office [phone]  12/13/2013 10:51 AM

## 2013-12-14 LAB — COMPREHENSIVE METABOLIC PANEL
ALBUMIN: 2.7 g/dL — AB (ref 3.5–5.2)
ALK PHOS: 54 U/L (ref 39–117)
ALT: 10 U/L (ref 0–35)
AST: 19 U/L (ref 0–37)
BUN: 11 mg/dL (ref 6–23)
CO2: 26 mEq/L (ref 19–32)
Calcium: 8.8 mg/dL (ref 8.4–10.5)
Chloride: 103 mEq/L (ref 96–112)
Creatinine, Ser: 0.73 mg/dL (ref 0.50–1.10)
GFR calc Af Amer: 85 mL/min — ABNORMAL LOW (ref >90)
GFR calc non Af Amer: 74 mL/min — ABNORMAL LOW (ref >90)
Glucose, Bld: 78 mg/dL (ref 70–99)
POTASSIUM: 3.8 meq/L (ref 3.7–5.3)
SODIUM: 141 meq/L (ref 137–147)
TOTAL PROTEIN: 6.2 g/dL (ref 6.0–8.3)
Total Bilirubin: 0.4 mg/dL (ref 0.3–1.2)

## 2013-12-14 LAB — GLUCOSE, CAPILLARY
GLUCOSE-CAPILLARY: 85 mg/dL (ref 70–99)
GLUCOSE-CAPILLARY: 150 mg/dL — AB (ref 70–99)
Glucose-Capillary: 117 mg/dL — ABNORMAL HIGH (ref 70–99)
Glucose-Capillary: 91 mg/dL (ref 70–99)

## 2013-12-14 LAB — CBC
HCT: 26.8 % — ABNORMAL LOW (ref 36.0–46.0)
Hemoglobin: 8.7 g/dL — ABNORMAL LOW (ref 12.0–15.0)
MCH: 29.8 pg (ref 26.0–34.0)
MCHC: 32.5 g/dL (ref 30.0–36.0)
MCV: 91.8 fL (ref 78.0–100.0)
Platelets: 176 10*3/uL (ref 150–400)
RBC: 2.92 MIL/uL — ABNORMAL LOW (ref 3.87–5.11)
RDW: 15 % (ref 11.5–15.5)
WBC: 9.3 10*3/uL (ref 4.0–10.5)

## 2013-12-14 NOTE — Progress Notes (Signed)
Subjective: Resting this AM, no issues overnight  Objective: Vital signs in last 24 hours: Temp:  [98.2 F (36.8 C)-98.8 F (37.1 C)] 98.3 F (36.8 C) (03/07 0702) Pulse Rate:  [83-98] 98 (03/07 0702) Resp:  [16] 16 (03/07 0702) BP: (129-152)/(67-73) 152/73 mmHg (03/07 0702) SpO2:  [90 %-94 %] 92 % (03/07 0702) Weight:  [131.8 kg (290 lb 9.1 oz)] 131.8 kg (290 lb 9.1 oz) (03/07 0500) Weight change: 72.691 kg (160 lb 4.1 oz) Last BM Date: 12/13/13  Intake/Output from previous day: 03/06 0701 - 03/07 0700 In: 2175 [P.O.:805; I.V.:1370] Out: 2350 [Urine:2350] Intake/Output this shift:   General appearance: alert and no distress  Eyes: no scleral icterus  Throat: oropharynx moist without erythema  Resp: clear to auscultation bilaterally  Cardio: regular rate and rhythm  GI: soft, non-tender; bowel sounds normal; no masses, no organomegaly  Extremities: no clubbing, cyanosis or edema   Lab Results:  Recent Labs  12/13/13 0455 12/14/13 0428  WBC 13.7* 9.3  HGB 8.5* 8.7*  HCT 26.0* 26.8*  PLT 172 176   BMET  Recent Labs  12/14/13 0428  NA 141  K 3.8  CL 103  CO2 26  GLUCOSE 78  BUN 11  CREATININE 0.73  CALCIUM 8.8    Studies/Results: Dg Abd 1 View  12/13/2013   CLINICAL DATA:  Evaluate barium progression  EXAM: ABDOMEN - 1 VIEW  COMPARISON:  Earlier film of the same day  FINDINGS: Scattered residual barium in the right lower quadrant and right pelvis. Normal bowel gas pattern. Lumbar dextroscoliosis with multilevel spondylitic change. Left hip arthroplasty hardware partially seen.  IMPRESSION: Continued decrease in amount of residual colonic barium.   Electronically Signed   By: Oley Balmaniel  Hassell M.D.   On: 12/13/2013 15:10   Dg Abd 1 View  12/13/2013   CLINICAL DATA:  Residual barium.  EXAM: ABDOMEN - 1 VIEW  COMPARISON:  December 12, 2013.  FINDINGS: No abnormal bowel gas pattern is noted. The large amount of residual barium seen in left colon on prior exam is no  longer visualized. Small amount of residual contrast is seen in the region of the cecum. Severe dextroscoliosis of thoracic spine is noted. Status post left hip arthroplasty.  IMPRESSION: No evidence of bowel obstruction or ileus is noted. Large amount of residual barium seen in left colon on prior exam is no longer present. Small amount of residual barium remains in the cecum.   Electronically Signed   By: Roque LiasJames  Green M.D.   On: 12/13/2013 08:40   Dg Abd 1 View  12/12/2013   CLINICAL DATA:  eval for residual barium; pre ct a/p scan  EXAM: ABDOMEN - 1 VIEW  COMPARISON:  CT ABD/PELVIS [facility] CM dated 12/11/2013, topogram  FINDINGS: Areas of residual dense barium project in the region of the cecum/ proximal ascending colon, mid transverse colon and throughout the descending colon and sigmoid colon regions. Air is seen within nondilated loops of small bowel. There is S-shaped scoliosis of the thoracolumbar spine and multilevel degenerative disc disease changes within the lumbar spine.  IMPRESSION: Residual areas of dense barium within the colon. Nonobstructive bowel gas pattern. The distribution and quantity of barium would degrade CT evaluation. Surveillance evaluation is recommended.   Electronically Signed   By: Salome HolmesHector  Cooper M.D.   On: 12/12/2013 10:47   Ct Abdomen Pelvis [facility] Contrast  12/14/2013   CLINICAL DATA:  Evaluate for abscess. Postop small bowel obstruction with resection.  EXAM: CT ABDOMEN AND  PELVIS WITH CONTRAST  TECHNIQUE: Multidetector CT imaging of the abdomen and pelvis was performed using the standard protocol following bolus administration of intravenous contrast.  CONTRAST:  25mL OMNIPAQUE IOHEXOL 300 MG/ML SOLN, 80mL OMNIPAQUE IOHEXOL 300 MG/ML SOLN  COMPARISON:  11/24/2013  FINDINGS: BODY WALL: Unremarkable.  LOWER CHEST: Coronary atherosclerosis. There is a small right pleural effusion which appears simple. Bibasilar atelectasis.  ABDOMEN/PELVIS:  Liver: No focal abnormality.  Biliary:  Cholecystectomy, which may account for biliary ductal dilation.  Pancreas: Unremarkable.  Spleen: Unremarkable.  Adrenals: Unremarkable.  Kidneys and ureters: Bilateral low dense renal lesions, presumably cysts although small size limits certainty.  Bladder: Gas in the bladder, likely from recent instrumentation.  Reproductive: Unremarkable.  Bowel: Enteroenterostomy in the right lower quadrant, likely the terminal ileum. Contrast has reached this portion, and there is no leak. No proximal obstruction. The anastomosis loops are mildly thickened, not unexpected after recent surgery. Liquid stool. Bulky dystrophic calcification which remains associated with the cecal base, related to remote appendectomy.  Retroperitoneum: No mass or adenopathy.  Peritoneum: No free fluid or gas.  Vascular: No acute abnormality.  OSSEOUS: Severe dextroscoliosis and degenerative disc change.  IMPRESSION: 1. No intra-abdominal findings to explain leukocytosis. No leak or intra-abdominal abscess after recent small bowel resection. 2. Basilar atelectasis and trace pleural effusions.   Electronically Signed   By: Tiburcio Pea M.D.   On: 12/14/2013 07:19    Medications:  I have reviewed the patient's current medications. Scheduled: . antiseptic oral rinse  15 mL Mouth Rinse q12n4p  . aspirin  325 mg Oral Daily  . chlorhexidine  15 mL Mouth Rinse BID  . feeding supplement (ENSURE COMPLETE)  237 mL Oral BID BM  . feeding supplement (RESOURCE BREEZE)  1 Container Oral BID BM  . insulin aspart  0-5 Units Subcutaneous QHS  . insulin aspart  0-9 Units Subcutaneous TID WC  . pantoprazole  40 mg Oral Daily   Continuous: . sodium chloride 50 mL/hr at 12/13/13 1610    Assessment/Plan: Principal Problem:  1. Small bowel obstruction with perforation s/p Ex Lap with bowel repair (POD #17). Unable to advance diet due to recurrent N/V. AXR shows nonobstructive pattern. UGI with SBFT normal. Started on Reglan. No evidence of  obstruction, will try oft diet. Active Problems:  2. Leukocytosis- completed 7 days of Zosyn post-op but WBC trending back up. CT and urinalysis ordered.  3. Rectal Bleeding- isolated event may be related to slow bowel transit (?stercoral ulcer). No further bleeding noted. Lovenox held but remains on ASA due to history of CVA.  4. Anemia-Hg stable. Received IV iron X 1.  5. Severe protein calorie malnutrition-TNA held to see if it is contributing to nausea. Consider restarting if unable to advance diet.  6. Acute on Chronic Renal Failure- resolved  7. 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 No behavioral issues.  8. History of CVA (cerebrovascular accident) with left hemiparesis- Aspirin restarted. BP OK (avoid hypotension due to prior CVA in setting of relative hypotension)  9. Disposition- anticipate discharge to SNF when tolerating diet. Will need to be off TNA for discharge to SNF.    LOS: 20 days   Lindsey Hensley,Lindsey Hensley 12/14/2013, 8:57 AM

## 2013-12-14 NOTE — Progress Notes (Signed)
17 Days Post-Op  Subjective: No complaints  Objective: Vital signs in last 24 hours: Temp:  [98.2 F (36.8 C)-98.8 F (37.1 C)] 98.3 F (36.8 C) (03/07 0702) Pulse Rate:  [83-98] 98 (03/07 0702) Resp:  [16] 16 (03/07 0702) BP: (129-152)/(67-73) 152/73 mmHg (03/07 0702) SpO2:  [90 %-94 %] 92 % (03/07 0702) Weight:  [290 lb 9.1 oz (131.8 kg)] 290 lb 9.1 oz (131.8 kg) (03/07 0500) Last BM Date: 12/13/13  Intake/Output from previous day: 03/06 0701 - 03/07 0700 In: 2175 [P.O.:805; I.V.:1370] Out: 2350 [Urine:2350] Intake/Output this shift:    Resp: clear to auscultation bilaterally Cardio: regular rate and rhythm GI: soft, nontender. good bs. wound clean  Lab Results:   Recent Labs  12/13/13 0455 12/14/13 0428  WBC 13.7* 9.3  HGB 8.5* 8.7*  HCT 26.0* 26.8*  PLT 172 176   BMET  Recent Labs  12/14/13 0428  NA 141  K 3.8  CL 103  CO2 26  GLUCOSE 78  BUN 11  CREATININE 0.73  CALCIUM 8.8   PT/INR No results found for this basename: LABPROT, INR,  in the last 72 hours ABG No results found for this basename: PHART, PCO2, PO2, HCO3,  in the last 72 hours  Studies/Results: Dg Abd 1 View  12/13/2013   CLINICAL DATA:  Evaluate barium progression  EXAM: ABDOMEN - 1 VIEW  COMPARISON:  Earlier film of the same day  FINDINGS: Scattered residual barium in the right lower quadrant and right pelvis. Normal bowel gas pattern. Lumbar dextroscoliosis with multilevel spondylitic change. Left hip arthroplasty hardware partially seen.  IMPRESSION: Continued decrease in amount of residual colonic barium.   Electronically Signed   By: Oley Balm M.D.   On: 12/13/2013 15:10   Dg Abd 1 View  12/13/2013   CLINICAL DATA:  Residual barium.  EXAM: ABDOMEN - 1 VIEW  COMPARISON:  December 12, 2013.  FINDINGS: No abnormal bowel gas pattern is noted. The large amount of residual barium seen in left colon on prior exam is no longer visualized. Small amount of residual contrast is seen in the  region of the cecum. Severe dextroscoliosis of thoracic spine is noted. Status post left hip arthroplasty.  IMPRESSION: No evidence of bowel obstruction or ileus is noted. Large amount of residual barium seen in left colon on prior exam is no longer present. Small amount of residual barium remains in the cecum.   Electronically Signed   By: Roque Lias M.D.   On: 12/13/2013 08:40   Dg Abd 1 View  12/12/2013   CLINICAL DATA:  eval for residual barium; pre ct a/p scan  EXAM: ABDOMEN - 1 VIEW  COMPARISON:  CT ABD/PELVIS W CM dated 12/11/2013, topogram  FINDINGS: Areas of residual dense barium project in the region of the cecum/ proximal ascending colon, mid transverse colon and throughout the descending colon and sigmoid colon regions. Air is seen within nondilated loops of small bowel. There is S-shaped scoliosis of the thoracolumbar spine and multilevel degenerative disc disease changes within the lumbar spine.  IMPRESSION: Residual areas of dense barium within the colon. Nonobstructive bowel gas pattern. The distribution and quantity of barium would degrade CT evaluation. Surveillance evaluation is recommended.   Electronically Signed   By: Salome Holmes M.D.   On: 12/12/2013 10:47   Ct Abdomen Pelvis W Contrast  12/14/2013   CLINICAL DATA:  Evaluate for abscess. Postop small bowel obstruction with resection.  EXAM: CT ABDOMEN AND PELVIS WITH CONTRAST  TECHNIQUE:  Multidetector CT imaging of the abdomen and pelvis was performed using the standard protocol following bolus administration of intravenous contrast.  CONTRAST:  25mL OMNIPAQUE IOHEXOL 300 MG/ML SOLN, 80mL OMNIPAQUE IOHEXOL 300 MG/ML SOLN  COMPARISON:  11/24/2013  FINDINGS: BODY WALL: Unremarkable.  LOWER CHEST: Coronary atherosclerosis. There is a small right pleural effusion which appears simple. Bibasilar atelectasis.  ABDOMEN/PELVIS:  Liver: No focal abnormality.  Biliary: Cholecystectomy, which may account for biliary ductal dilation.  Pancreas:  Unremarkable.  Spleen: Unremarkable.  Adrenals: Unremarkable.  Kidneys and ureters: Bilateral low dense renal lesions, presumably cysts although small size limits certainty.  Bladder: Gas in the bladder, likely from recent instrumentation.  Reproductive: Unremarkable.  Bowel: Enteroenterostomy in the right lower quadrant, likely the terminal ileum. Contrast has reached this portion, and there is no leak. No proximal obstruction. The anastomosis loops are mildly thickened, not unexpected after recent surgery. Liquid stool. Bulky dystrophic calcification which remains associated with the cecal base, related to remote appendectomy.  Retroperitoneum: No mass or adenopathy.  Peritoneum: No free fluid or gas.  Vascular: No acute abnormality.  OSSEOUS: Severe dextroscoliosis and degenerative disc change.  IMPRESSION: 1. No intra-abdominal findings to explain leukocytosis. No leak or intra-abdominal abscess after recent small bowel resection. 2. Basilar atelectasis and trace pleural effusions.   Electronically Signed   By: Tiburcio PeaJonathan  Watts M.D.   On: 12/14/2013 07:19    Anti-infectives: Anti-infectives   Start     Dose/Rate Route Frequency Ordered Stop   11/28/13 0000  piperacillin-tazobactam (ZOSYN) IVPB 3.375 g  Status:  Discontinued     3.375 g 12.5 mL/hr over 240 Minutes Intravenous Every 8 hours 11/27/13 1623 12/05/13 1234   11/27/13 1500  piperacillin-tazobactam (ZOSYN) IVPB 3.375 g     3.375 g 100 mL/hr over 30 Minutes Intravenous  Once 11/27/13 1443 11/27/13 1445   11/25/13 2359  [MAR Hold]  ceFEPIme (MAXIPIME) 1 g in dextrose 5 % 50 mL IVPB  Status:  Discontinued     (On MAR Hold since 11/27/13 1345)   1 g 100 mL/hr over 30 Minutes Intravenous Every 24 hours 11/25/13 2300 11/27/13 1633   11/24/13 1800  ampicillin-sulbactam (UNASYN) 1.5 g in sodium chloride 0.9 % 50 mL IVPB  Status:  Discontinued     1.5 g 100 mL/hr over 30 Minutes Intravenous Every 12 hours 11/24/13 1549 11/25/13 0956       Assessment/Plan: s/p Procedure(s): DIAGNOSTIC LAPAROSCOPY OPEN RESECTION OF SMALL BOWEL (N/A) Advance diet Ambulate with assistance CT shows no evidence of abscess and her WBC is normal now  LOS: 20 days    TOTH III,Marjie Chea S 12/14/2013

## 2013-12-14 NOTE — Progress Notes (Signed)
Pt has had 2 incidences of nausea and vomiting this shift. Had first incident this AM after breakfast when she vomited about a small amount of stomach contents. Second incident was immediately after lunch, when according to son at bedside, she vomited about 3/4th of her lunch. No nausea prior to incidents. Vwilliams,rn.

## 2013-12-15 LAB — GLUCOSE, CAPILLARY
GLUCOSE-CAPILLARY: 104 mg/dL — AB (ref 70–99)
GLUCOSE-CAPILLARY: 90 mg/dL (ref 70–99)
Glucose-Capillary: 94 mg/dL (ref 70–99)
Glucose-Capillary: 103 mg/dL — ABNORMAL HIGH (ref 70–99)

## 2013-12-15 LAB — COMPREHENSIVE METABOLIC PANEL
ALBUMIN: 2.7 g/dL — AB (ref 3.5–5.2)
ALK PHOS: 53 U/L (ref 39–117)
ALT: 7 U/L (ref 0–35)
AST: 15 U/L (ref 0–37)
BUN: 8 mg/dL (ref 6–23)
CO2: 27 mEq/L (ref 19–32)
Calcium: 8.7 mg/dL (ref 8.4–10.5)
Chloride: 104 mEq/L (ref 96–112)
Creatinine, Ser: 0.82 mg/dL (ref 0.50–1.10)
GFR calc Af Amer: 71 mL/min — ABNORMAL LOW (ref >90)
GFR calc non Af Amer: 62 mL/min — ABNORMAL LOW (ref >90)
Glucose, Bld: 95 mg/dL (ref 70–99)
POTASSIUM: 3.7 meq/L (ref 3.7–5.3)
Sodium: 141 mEq/L (ref 137–147)
TOTAL PROTEIN: 6.4 g/dL (ref 6.0–8.3)
Total Bilirubin: 0.3 mg/dL (ref 0.3–1.2)

## 2013-12-15 LAB — CBC
HEMATOCRIT: 27.3 % — AB (ref 36.0–46.0)
Hemoglobin: 8.8 g/dL — ABNORMAL LOW (ref 12.0–15.0)
MCH: 29.5 pg (ref 26.0–34.0)
MCHC: 32.2 g/dL (ref 30.0–36.0)
MCV: 91.6 fL (ref 78.0–100.0)
PLATELETS: 189 10*3/uL (ref 150–400)
RBC: 2.98 MIL/uL — ABNORMAL LOW (ref 3.87–5.11)
RDW: 15.1 % (ref 11.5–15.5)
WBC: 9.3 10*3/uL (ref 4.0–10.5)

## 2013-12-15 MED ORDER — PROMETHAZINE HCL 12.5 MG PO TABS
12.5000 mg | ORAL_TABLET | Freq: Three times a day (TID) | ORAL | Status: DC
  Administered 2013-12-15 – 2013-12-17 (×6): 12.5 mg via ORAL
  Filled 2013-12-15 (×9): qty 1

## 2013-12-15 NOTE — Progress Notes (Signed)
Occupational Therapy Treatment Patient Details Name: Lindsey Hensley MRN: [id number] DOB: [birth year] Today's Date: 12/15/2013 Time: 0812-0830 OT Time Calculation (min): 18 min  OT Assessment / Plan / Recommendation  History of present illness pt is s/p exploratory lap   she has a h/o CVA, dementia, L THA 6 months ago.  Poor ST memory.   OT comments  Tested for BPPV, negative  Follow Up Recommendations  Supervision/Assistance - 24 hour    Barriers to Discharge       Equipment Recommendations  3 in 1 bedside comode    Recommendations for Other Services    Frequency Min 2X/week   Progress towards OT Goals Progress towards OT goals: Progressing toward goals  Plan Discharge plan remains appropriate    Precautions / Restrictions Precautions Precautions: Fall Precaution Comments: dementia with short term memory loss Restrictions Weight Bearing Restrictions: No   Pertinent Vitals/Pain States belly is sore; repositioned     ADL  Toilet Transfer: Minimal assistance Toilet Transfer Method: Stand pivot Toilet Transfer Equipment: Bedside commode Transfers/Ambulation Related to ADLs: spt to 3:1 commode ADL Comments: Checked pt for BPPV in modified [employer], negative L and R.  No c/o dizziness this session    OT Diagnosis:    OT Problem List:   OT Treatment Interventions:     OT Goals(current goals can now be found in the care plan section)    Visit Information  Last OT Received On: 12/15/13 Assistance Needed: +1 History of Present Illness: pt is s/p exploratory lap   she has a h/o CVA, dementia, L THA 6 months ago.  Poor ST memory.    Subjective Data      Prior Functioning       Cognition  Cognition Arousal/Alertness: Awake/alert Behavior During Therapy: WFL for tasks assessed/performed Overall Cognitive Status: History of cognitive impairments - at baseline    Mobility  Bed Mobility Overal bed mobility: Modified Independent Bed Mobility: Supine to Sit General  bed mobility comments: hob raised; used +2 for sidelying to reduce stress on abdomen during BPPV test Transfers Transfers: Sit to/from Stand Sit to Stand: Min assist General transfer comment: no AD, SPT to 3:1    Exercises      Balance    End of Session OT - End of Session Activity Tolerance: Patient tolerated treatment well Patient left: in bed;with call bell/phone within reach;with bed alarm set  GO     Adrianne Shackleton 12/15/2013, 9:02 AM Lindsey Hensley, OTR/L [phone] 12/15/2013

## 2013-12-15 NOTE — Progress Notes (Signed)
Subjective: Did better with breakfast, not as well with lunch yesterday.  No family present.  Objective: Vital signs in last 24 hours: Temp:  [97.8 F (36.6 C)-98.3 F (36.8 C)] 98 F (36.7 C) (03/08 0510) Pulse Rate:  [78-84] 84 (03/08 0510) Resp:  [16-18] 18 (03/08 0510) BP: (116-133)/(62-77) 133/70 mmHg (03/08 0510) SpO2:  [90 %-94 %] 90 % (03/08 0510) Weight:  [56.6 kg (124 lb 12.5 oz)] 56.6 kg (124 lb 12.5 oz) (03/08 0500) Weight change: -75.2 kg (-165 lb 12.6 oz) Last BM Date: 12/14/13  Intake/Output from previous day: 03/07 0701 - 03/08 0700 In: 1680 [P.O.:480; I.V.:1200] Out: 1000 [Urine:1000] Intake/Output this shift:   General appearance: alert and no distress  Eyes: no scleral icterus  Throat: oropharynx moist without erythema  Resp: clear to auscultation bilaterally  Cardio: regular rate and rhythm  GI: soft, non-tender; bowel sounds normal; no masses, no organomegaly  Extremities: no clubbing, cyanosis or edema   BMET  Recent Labs  12/14/13 0428 12/15/13 0455  NA 141 141  K 3.8 3.7  CL 103 104  CO2 26 27  GLUCOSE 78 95  BUN 11 8  CREATININE 0.73 0.82  CALCIUM 8.8 8.7    Studies/Results: Dg Abd 1 View  12/13/2013   CLINICAL DATA:  Evaluate barium progression  EXAM: ABDOMEN - 1 VIEW  COMPARISON:  Earlier film of the same day  FINDINGS: Scattered residual barium in the right lower quadrant and right pelvis. Normal bowel gas pattern. Lumbar dextroscoliosis with multilevel spondylitic change. Left hip arthroplasty hardware partially seen.  IMPRESSION: Continued decrease in amount of residual colonic barium.   Electronically Signed   By: Oley Balm M.D.   On: 12/13/2013 15:10   Ct Abdomen Pelvis W Contrast  12/14/2013   CLINICAL DATA:  Evaluate for abscess. Postop small bowel obstruction with resection.  EXAM: CT ABDOMEN AND PELVIS WITH CONTRAST  TECHNIQUE: Multidetector CT imaging of the abdomen and pelvis was performed using the standard protocol  following bolus administration of intravenous contrast.  CONTRAST:  25mL OMNIPAQUE IOHEXOL 300 MG/ML SOLN, 80mL OMNIPAQUE IOHEXOL 300 MG/ML SOLN  COMPARISON:  11/24/2013  FINDINGS: BODY WALL: Unremarkable.  LOWER CHEST: Coronary atherosclerosis. There is a small right pleural effusion which appears simple. Bibasilar atelectasis.  ABDOMEN/PELVIS:  Liver: No focal abnormality.  Biliary: Cholecystectomy, which may account for biliary ductal dilation.  Pancreas: Unremarkable.  Spleen: Unremarkable.  Adrenals: Unremarkable.  Kidneys and ureters: Bilateral low dense renal lesions, presumably cysts although small size limits certainty.  Bladder: Gas in the bladder, likely from recent instrumentation.  Reproductive: Unremarkable.  Bowel: Enteroenterostomy in the right lower quadrant, likely the terminal ileum. Contrast has reached this portion, and there is no leak. No proximal obstruction. The anastomosis loops are mildly thickened, not unexpected after recent surgery. Liquid stool. Bulky dystrophic calcification which remains associated with the cecal base, related to remote appendectomy.  Retroperitoneum: No mass or adenopathy.  Peritoneum: No free fluid or gas.  Vascular: No acute abnormality.  OSSEOUS: Severe dextroscoliosis and degenerative disc change.  IMPRESSION: 1. No intra-abdominal findings to explain leukocytosis. No leak or intra-abdominal abscess after recent small bowel resection. 2. Basilar atelectasis and trace pleural effusions.   Electronically Signed   By: Tiburcio Pea M.D.   On: 12/14/2013 07:19    Medications:  I have reviewed the patient's current medications. Scheduled: . aspirin  325 mg Oral Daily  . feeding supplement (ENSURE COMPLETE)  237 mL Oral BID BM  . feeding supplement (  RESOURCE BREEZE)  1 Container Oral BID BM  . insulin aspart  0-5 Units Subcutaneous QHS  . insulin aspart  0-9 Units Subcutaneous TID WC  . pantoprazole  40 mg Oral Daily  . promethazine  12.5 mg Oral TID AC    Continuous: . sodium chloride 50 mL/hr at 12/14/13 2202   WGN:[id number]:acetaminophen, acetaminophen, HYDROcodone-acetaminophen, metoCLOPramide, morphine injection, ondansetron (ZOFRAN) IV, polyvinyl alcohol, sodium chloride  Assessment/Plan: Principal Problem:  1. Small bowel obstruction with perforation s/p Ex Lap with bowel repair (POD #18). Soft diet, will try phenergan pre meal.  If no better, GI consult tomorrow. Active Problems:  2. Leukocytosis- completed 7 days of Zosyn post-op but  3. Rectal Bleeding- isolated event may be related to slow bowel transit (?stercoral ulcer). No further bleeding noted. Lovenox held but remains on ASA due to history of CVA.  4. Anemia-Hg stable. Received IV iron X 1.  5. Severe protein calorie malnutrition-THold on TNA as was able to get a decent amount of calories in yesterday as compared to prior.6. Acute on Chronic Renal Failure- resolved  7. 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 No behavioral issues.  8. History of CVA (cerebrovascular accident) with left hemiparesis- Aspirin restarted. BP OK (avoid hypotension due to prior CVA in setting of relative hypotension)  9. Disposition- anticipate discharge to SNF when tolerating diet. Will need to be off TNA for discharge to SNF.    LOS: 21 days   Lindsey Hensley W 12/15/2013, 8:40 AM

## 2013-12-15 NOTE — Progress Notes (Signed)
18 Days Post-Op  Subjective: No complaints. Confused but pleasant  Objective: Vital signs in last 24 hours: Temp:  [97.8 F (36.6 C)-98.3 F (36.8 C)] 98 F (36.7 C) (03/08 0510) Pulse Rate:  [78-84] 84 (03/08 0510) Resp:  [16-18] 18 (03/08 0510) BP: (116-133)/(62-77) 133/70 mmHg (03/08 0510) SpO2:  [90 %-94 %] 90 % (03/08 0510) Weight:  [124 lb 12.5 oz (56.6 kg)] 124 lb 12.5 oz (56.6 kg) (03/08 0500) Last BM Date: 12/14/13  Intake/Output from previous day: 03/07 0701 - 03/08 0700 In: 1680 [P.O.:480; I.V.:1200] Out: 1000 [Urine:1000] Intake/Output this shift:    Resp: clear to auscultation bilaterally Cardio: regular rate and rhythm GI: soft, nontender. good bs  Lab Results:   Recent Labs  12/14/13 0428 12/15/13 0455  WBC 9.3 9.3  HGB 8.7* 8.8*  HCT 26.8* 27.3*  PLT 176 189   BMET  Recent Labs  12/14/13 0428 12/15/13 0455  NA 141 141  K 3.8 3.7  CL 103 104  CO2 26 27  GLUCOSE 78 95  BUN 11 8  CREATININE 0.73 0.82  CALCIUM 8.8 8.7   PT/INR No results found for this basename: LABPROT, INR,  in the last 72 hours ABG No results found for this basename: PHART, PCO2, PO2, HCO3,  in the last 72 hours  Studies/Results: Dg Abd 1 View  12/13/2013   CLINICAL DATA:  Evaluate barium progression  EXAM: ABDOMEN - 1 VIEW  COMPARISON:  Earlier film of the same day  FINDINGS: Scattered residual barium in the right lower quadrant and right pelvis. Normal bowel gas pattern. Lumbar dextroscoliosis with multilevel spondylitic change. Left hip arthroplasty hardware partially seen.  IMPRESSION: Continued decrease in amount of residual colonic barium.   Electronically Signed   By: Oley Balm M.D.   On: 12/13/2013 15:10   Ct Abdomen Pelvis W Contrast  12/14/2013   CLINICAL DATA:  Evaluate for abscess. Postop small bowel obstruction with resection.  EXAM: CT ABDOMEN AND PELVIS WITH CONTRAST  TECHNIQUE: Multidetector CT imaging of the abdomen and pelvis was performed using the  standard protocol following bolus administration of intravenous contrast.  CONTRAST:  25mL OMNIPAQUE IOHEXOL 300 MG/ML SOLN, 80mL OMNIPAQUE IOHEXOL 300 MG/ML SOLN  COMPARISON:  11/24/2013  FINDINGS: BODY WALL: Unremarkable.  LOWER CHEST: Coronary atherosclerosis. There is a small right pleural effusion which appears simple. Bibasilar atelectasis.  ABDOMEN/PELVIS:  Liver: No focal abnormality.  Biliary: Cholecystectomy, which may account for biliary ductal dilation.  Pancreas: Unremarkable.  Spleen: Unremarkable.  Adrenals: Unremarkable.  Kidneys and ureters: Bilateral low dense renal lesions, presumably cysts although small size limits certainty.  Bladder: Gas in the bladder, likely from recent instrumentation.  Reproductive: Unremarkable.  Bowel: Enteroenterostomy in the right lower quadrant, likely the terminal ileum. Contrast has reached this portion, and there is no leak. No proximal obstruction. The anastomosis loops are mildly thickened, not unexpected after recent surgery. Liquid stool. Bulky dystrophic calcification which remains associated with the cecal base, related to remote appendectomy.  Retroperitoneum: No mass or adenopathy.  Peritoneum: No free fluid or gas.  Vascular: No acute abnormality.  OSSEOUS: Severe dextroscoliosis and degenerative disc change.  IMPRESSION: 1. No intra-abdominal findings to explain leukocytosis. No leak or intra-abdominal abscess after recent small bowel resection. 2. Basilar atelectasis and trace pleural effusions.   Electronically Signed   By: Tiburcio Pea M.D.   On: 12/14/2013 07:19    Anti-infectives: Anti-infectives   Start     Dose/Rate Route Frequency Ordered Stop   11/28/13  0000  piperacillin-tazobactam (ZOSYN) IVPB 3.375 g  Status:  Discontinued     3.375 g 12.5 mL/hr over 240 Minutes Intravenous Every 8 hours 11/27/13 1623 12/05/13 1234   11/27/13 1500  piperacillin-tazobactam (ZOSYN) IVPB 3.375 g     3.375 g 100 mL/hr over 30 Minutes Intravenous   Once 11/27/13 1443 11/27/13 1445   11/25/13 2359  [MAR Hold]  ceFEPIme (MAXIPIME) 1 g in dextrose 5 % 50 mL IVPB  Status:  Discontinued     (On MAR Hold since 11/27/13 1345)   1 g 100 mL/hr over 30 Minutes Intravenous Every 24 hours 11/25/13 2300 11/27/13 1633   11/24/13 1800  ampicillin-sulbactam (UNASYN) 1.5 g in sodium chloride 0.9 % 50 mL IVPB  Status:  Discontinued     1.5 g 100 mL/hr over 30 Minutes Intravenous Every 12 hours 11/24/13 1549 11/25/13 0956      Assessment/Plan: s/p Procedure(s): DIAGNOSTIC LAPAROSCOPY OPEN RESECTION OF SMALL BOWEL (N/A) Advance diet Await snf placement  LOS: 21 days    TOTH III,Taiylor Virden S 12/15/2013

## 2013-12-16 LAB — CBC
HCT: 26.5 % — ABNORMAL LOW (ref 36.0–46.0)
Hemoglobin: 8.6 g/dL — ABNORMAL LOW (ref 12.0–15.0)
MCH: 30.1 pg (ref 26.0–34.0)
MCHC: 32.5 g/dL (ref 30.0–36.0)
MCV: 92.7 fL (ref 78.0–100.0)
Platelets: 175 10*3/uL (ref 150–400)
RBC: 2.86 MIL/uL — ABNORMAL LOW (ref 3.87–5.11)
RDW: 15.1 % (ref 11.5–15.5)
WBC: 7.8 10*3/uL (ref 4.0–10.5)

## 2013-12-16 NOTE — Progress Notes (Signed)
19 Days Post-Op  Subjective: She says she doesn't feel very good, but she isn't sure why.  The aide says she complained of nausea with eating.  She can't remember if she was nauseated last PM or not.  She says her stools are between normal and runny.  Objective: Vital signs in last 24 hours: Temp:  [98.1 F (36.7 C)-98.9 F (37.2 C)] 98.2 F (36.8 C) (03/09 0553) Pulse Rate:  [75-80] 80 (03/09 0553) Resp:  [16-18] 16 (03/09 0553) BP: (131-137)/(71-75) 134/75 mmHg (03/09 0553) SpO2:  [90 %-94 %] 94 % (03/09 0553) Last BM Date: 12/14/13 1600 PO;  Soft diet Afebrile, VSS Labs OK CT on 12/14/13:No intra-abdominal findings to explain leukocytosis. No leak or intra-abdominal abscess after recent small bowel resection.   Intake/Output from previous day: 03/08 0701 - 03/09 0700 In: 2800 [P.O.:1600; I.V.:1200] Out: 2075 [Urine:2075] Intake/Output this shift:    General appearance: alert, cooperative and she simply cannot remember much of what is occuring, even from breakfast. GI: soft, non-tender; bowel sounds normal; no masses,  no organomegaly and open wound looks great with wet to dry dressings.  Lab Results:   Recent Labs  12/15/13 0455 12/16/13 0500  WBC 9.3 7.8  HGB 8.8* 8.6*  HCT 27.3* 26.5*  PLT 189 175    BMET  Recent Labs  12/14/13 0428 12/15/13 0455  NA 141 141  K 3.8 3.7  CL 103 104  CO2 26 27  GLUCOSE 78 95  BUN 11 8  CREATININE 0.73 0.82  CALCIUM 8.8 8.7   PT/INR No results found for this basename: LABPROT, INR,  in the last 72 hours   Recent Labs Lab 12/14/13 0428 12/15/13 0455  AST 19 15  ALT 10 7  ALKPHOS 54 53  BILITOT 0.4 0.3  PROT 6.2 6.4  ALBUMIN 2.7* 2.7*     Lipase     Component Value Date/Time   LIPASE 12 11/24/2013 0950     Studies/Results: No results found.  Medications: . aspirin  325 mg Oral Daily  . feeding supplement (ENSURE COMPLETE)  237 mL Oral BID BM  . insulin aspart  0-5 Units Subcutaneous QHS  . insulin  aspart  0-9 Units Subcutaneous TID WC  . pantoprazole  40 mg Oral Daily  . promethazine  12.5 mg Oral TID AC    Assessment/Plan Small bowel obstruction, with intraoperative finding of; Closed loop small bowel obstruction with perforation. Abdominal laparoscopy converted to open laparotomy with small bowel resection. Hiatal hernia.11/28/2013, Dr. Ezzard StandingNewman. Anemia-Hg stable Severe protein calorie malnutrition Acute on Chronic Renal Failure Dementia in Alzheimer's disease with delirium History of CVA (cerebrovascular accident) with left hemiparesis SCD for DVT prophylaxis  Plan:  Dr. Clelia CroftShaw saw her earlier and would agree with his plan.  From a surgical standpoint I don't see much for us to add.    LOS: 22 days    Lindsey Hensley 12/16/2013

## 2013-12-16 NOTE — Progress Notes (Signed)
Subjective: RN reports no N/V since yesterday morning.  Limited oral intake but seems to be doing better with scheduled phenargan.  Patient without complaint this am.  Objective: Vital signs in last 24 hours: Temp:  [98.1 F (36.7 C)-98.9 F (37.2 C)] 98.2 F (36.8 C) (03/09 0553) Pulse Rate:  [75-80] 80 (03/09 0553) Resp:  [16-18] 16 (03/09 0553) BP: (131-137)/(71-75) 134/75 mmHg (03/09 0553) SpO2:  [90 %-94 %] 94 % (03/09 0553) Weight change:  Last BM Date: 12/14/13  CBG (last 3)   Recent Labs  12/15/13 1156 12/15/13 1715 12/15/13 2025  GLUCAP 104* 103* 90    Intake/Output from previous day: 03/08 0701 - 03/09 0700 In: 2800 [P.O.:1600; I.V.:1200] Out: 2075 [Urine:2075] Intake/Output this shift:    General appearance: alert and no distress Eyes: no scleral icterus Throat: oropharynx moist without erythema Resp: clear to auscultation bilaterally Cardio: regular rate and rhythm GI: soft, non-tender; bowel sounds normal; no masses,  no organomegaly Extremities: no clubbing, cyanosis or edema   Lab Results:  Recent Labs  12/14/13 0428 12/15/13 0455  NA 141 141  K 3.8 3.7  CL 103 104  CO2 26 27  GLUCOSE 78 95  BUN 11 8  CREATININE 0.73 0.82  CALCIUM 8.8 8.7    Recent Labs  12/14/13 0428 12/15/13 0455  AST 19 15  ALT 10 7  ALKPHOS 54 53  BILITOT 0.4 0.3  PROT 6.2 6.4  ALBUMIN 2.7* 2.7*    Recent Labs  12/15/13 0455 12/16/13 0500  WBC 9.3 7.8  HGB 8.8* 8.6*  HCT 27.3* 26.5*  MCV 91.6 92.7  PLT 189 175   Lab Results  Component Value Date   INR 1.10 02/25/2011   No results found for this basename: CKTOTAL, CKMB, CKMBINDEX, TROPONINI,  in the last 72 hours No results found for this basename: TSH, T4TOTAL, FREET3, T3FREE, THYROIDAB,  in the last 72 hours No results found for this basename: VITAMINB12, FOLATE, FERRITIN, TIBC, IRON, RETICCTPCT,  in the last 72 hours  Studies/Results: No results found.   Medications: Scheduled: . aspirin   325 mg Oral Daily  . feeding supplement (ENSURE COMPLETE)  237 mL Oral BID BM  . feeding supplement (RESOURCE BREEZE)  1 Container Oral BID BM  . insulin aspart  0-5 Units Subcutaneous QHS  . insulin aspart  0-9 Units Subcutaneous TID WC  . pantoprazole  40 mg Oral Daily  . promethazine  12.5 mg Oral TID AC   Continuous: . sodium chloride 50 mL/hr at 12/15/13 1901    Assessment/Plan: Principal Problem:  1. Small bowel obstruction with perforation s/p Ex Lap with bowel repair (POD #19). N/V improved with scheduled phenergan.  Will monitor po intake today and plan for discharge tomorrow if able to maintain oral intake.  If recurrent N/V consider GI consult. Active Problems:  2. Leukocytosis- resovled. 3. Rectal Bleeding-resolved. isolated event may be related to slow bowel transit (?stercoral ulcer). No further bleeding noted. Lovenox held but remains on ASA due to history of CVA.  4. Anemia-Hg stable. Received IV iron X 1.  5. Severe protein calorie malnutrition- continue Ensure supplements, encourage pos. 6. Acute on Chronic Renal Failure- resolved  7. 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 No behavioral issues. Consider Aricept +/- Namenda once stable after discharge. 8. History of CVA (cerebrovascular accident) with left hemiparesis- Aspirin restarted. BP OK (avoid hypotension due to prior CVA in setting of relative hypotension)  9. Disposition- anticipate discharge to SNF tomorrow if  tolerating intake.   LOS: 22 days   Martha Clan 12/16/2013, 7:15 AM

## 2013-12-16 NOTE — Progress Notes (Signed)
Occupational Therapy Treatment Patient Details Name: Lindsey CarnesRuth S Hensley MRN: [id number] DOB: 1922/03/15 Today's Date: 12/16/2013 Time: [id number] OT Time Calculation (min): 13 min  OT Assessment / Plan / Recommendation  History of present illness pt is s/p exploratory lap   she has a h/o CVA, dementia, L THA 6 months ago.  Poor ST memory.      Follow Up Recommendations  Supervision/Assistance - 24 hour       Equipment Recommendations  3 in 1 bedside comode    Recommendations for Other Services    Frequency Min 2X/week      Plan Discharge plan remains appropriate    Precautions / Restrictions Precautions Precautions: Fall Precaution Comments: dementia with short term memory loss Restrictions Weight Bearing Restrictions: No       ADL  Grooming: Wash/dry hands;Wash/dry face;Brushing hair;Minimal assistance Where Assessed - Grooming: Unsupported standing Toilet Transfer Method: Sit to stand;Other (comment) (sit to stand 3 times focusing on pushing up and reaching back) Toileting - ArchitectClothing Manipulation and Hygiene: Min guard Where Assessed - Toileting Clothing Manipulation and Hygiene: Standing ADL Comments: verbal cues for safety with sit to stand      OT Goals(current goals can now be found in the care plan section)    Visit Information  Last OT Received On: 12/16/13 Assistance Needed: +1 History of Present Illness: pt is s/p exploratory lap   she has a h/o CVA, dementia, L THA 6 months ago.  Poor ST memory.          Cognition  Cognition Arousal/Alertness: Awake/alert Behavior During Therapy: WFL for tasks assessed/performed Overall Cognitive Status: History of cognitive impairments - at baseline    Mobility  Bed Mobility Overal bed mobility: Needs Assistance Supine to sit: Supervision;Min guard General bed mobility comments: VC's for safety esp scooting to EOB not to scoot to far Transfers Overall transfer level: Needs assistance Equipment used: Rolling walker  (2 wheeled) Transfers: Sit to/from Stand Sit to Stand: Min assist General transfer comment: 50% VC's on proper hand placement and safety with turns esp targeting          End of Session OT - End of Session Activity Tolerance: Patient tolerated treatment well Patient left: in bed;with call bell/phone within reach;with bed alarm set  GO     Uno Esau, Metro KungLorraine D 12/16/2013, 3:20 PM

## 2013-12-16 NOTE — Progress Notes (Signed)
I have seen and examined the pt and agree with PA-Jenning's progress note. Con't Po Having bowel function  Hopefully home in next 1-2d

## 2013-12-16 NOTE — Progress Notes (Signed)
Physical Therapy Treatment Patient Details Name: Dorris CarnesRuth S Shaheen MRN: [id number] DOB: [birth year] Today's Date: 12/16/2013 Time: [id number] PT Time Calculation (min): 25 min  PT Assessment / Plan / Recommendation  History of Present Illness pt is s/p exploratory lap   she has a h/o CVA, dementia, L THA 6 months ago.  Poor ST memory.   PT Comments   Assisted pt OOB to amb to BR to void then amb in hallway twice with one sitting rest break due to MAX c/o fatigue and weakness.  Pt requires VC's for walker safety and negotiating around obstacles.  Pt demon unsteady gait and plans to D/C to SNF for Rehab.  Follow Up Recommendations  SNF ([facility]hal's stated son)     Does the patient have the potential to tolerate intense rehabilitation     Barriers to Discharge        Equipment Recommendations       Recommendations for Other Services    Frequency Min 3X/week   Progress towards PT Goals Progress towards PT goals: Progressing toward goals  Plan      Precautions / Restrictions Precautions Precautions: Fall Precaution Comments: dementia with short term memory loss Restrictions Weight Bearing Restrictions: No   Pertinent Vitals/Pain No c/o pain    Mobility  Bed Mobility Overal bed mobility: Needs Assistance Supine to sit: Supervision;Min guard General bed mobility comments: VC's for safety esp scooting to EOB not to scoot to far Transfers Overall transfer level: Needs assistance Equipment used: Rolling walker (2 wheeled) Transfers: Sit to/from Stand Sit to Stand: Min assist General transfer comment: 50% VC's on proper hand placement and safety with turns esp targeting Ambulation/Gait Ambulation/Gait assistance: Min assist;Min guard Ambulation Distance (Feet): 110 Feet (55' x 2 one sitting rest break) Assistive device: Rolling walker (2 wheeled) Gait Pattern/deviations: Step-through pattern;Trunk flexed Gait velocity: decreased General Gait Details: pt requires frequent cues for  RW distance from self,  posture and safety; she often walks outside the walker during turns    PT Goals (current goals can now be found in the care plan section)    Visit Information  Last PT Received On: 12/16/13 Assistance Needed: +1 History of Present Illness: pt is s/p exploratory lap   she has a h/o CVA, dementia, L THA 6 months ago.  Poor ST memory.    Subjective Data      Cognition  Cognition Arousal/Alertness: Awake/alert Behavior During Therapy: WFL for tasks assessed/performed Overall Cognitive Status: History of cognitive impairments - at baseline    Balance     End of Session PT - End of Session Equipment Utilized During Treatment: Gait belt Activity Tolerance: Patient tolerated treatment well Patient left: in chair;with call bell/phone within reach;with family/visitor present   Felecia ShellingLori Kaylanie Capili  PTA [facility]  Acute  Rehab Pager      [phone]

## 2013-12-17 DIAGNOSIS — K631 Perforation of intestine (nontraumatic): Secondary | ICD-10-CM | POA: Diagnosis present

## 2013-12-17 DIAGNOSIS — R112 Nausea with vomiting, unspecified: Secondary | ICD-10-CM | POA: Diagnosis present

## 2013-12-17 LAB — CBC
HEMATOCRIT: 28.8 % — AB (ref 36.0–46.0)
Hemoglobin: 9.3 g/dL — ABNORMAL LOW (ref 12.0–15.0)
MCH: 29.6 pg (ref 26.0–34.0)
MCHC: 32.3 g/dL (ref 30.0–36.0)
MCV: 91.7 fL (ref 78.0–100.0)
Platelets: 194 10*3/uL (ref 150–400)
RBC: 3.14 MIL/uL — AB (ref 3.87–5.11)
RDW: 15.1 % (ref 11.5–15.5)
WBC: 9.5 10*3/uL (ref 4.0–10.5)

## 2013-12-17 MED ORDER — ENSURE COMPLETE PO LIQD: 237.0000 mL | Freq: Two times a day (BID) | ORAL | Status: AC

## 2013-12-17 MED ORDER — PANTOPRAZOLE SODIUM 40 MG PO TBEC: 40.0000 mg | DELAYED_RELEASE_TABLET | Freq: Every day | ORAL | Status: AC

## 2013-12-17 MED ORDER — ACETAMINOPHEN 325 MG PO TABS: 650.0000 mg | ORAL_TABLET | Freq: Four times a day (QID) | ORAL | Status: DC | PRN

## 2013-12-17 MED ORDER — ONDANSETRON HCL 4 MG/2ML IJ SOLN: 4.0000 mg | Freq: Four times a day (QID) | INTRAMUSCULAR | Status: AC | PRN

## 2013-12-17 MED ORDER — PROMETHAZINE HCL 12.5 MG PO TABS
12.5000 mg | ORAL_TABLET | Freq: Three times a day (TID) | ORAL | Status: AC
Start: 2013-12-17 — End: ?

## 2013-12-17 NOTE — Progress Notes (Addendum)
  Clinical Social Work Department CLINICAL SOCIAL WORK PLACEMENT NOTE 12/17/2013  Patient:  Lindsey Hensley,Lindsey Hensley  Account Number:  [id number] Admit date:  11/24/2013  Clinical Social Worker:  Orpah GreekKELLY FOLEY, LCSWA  Date/time:  11/29/2013 03:18 PM  Clinical Social Work is seeking post-discharge placement for this patient at the following level of care:   SKILLED NURSING   (*CSW will update this form in Epic as items are completed)   11/29/2013  [facility]/family [facility] with [facility] list of facilities offering this level of care within the geographic area requested by the patient (or if unable, by the patient'H[place] family).  11/29/2013  Patient/family informed of their freedom to choose among providers that offer the needed level of care, that participate in Medicare, Medicaid or managed care program needed by the patient, have an available bed and are willing to accept the patient.  11/29/2013  Patient/family informed of [facility]' ownership interest in [facility], as well as of the fact that they are under no obligation to receive care at this facility.  PASARR submitted to EDS on 11/29/2013 PASARR number received from [employer] on 11/29/2013  FL2 transmitted to all facilities in geographic area requested by pt/family on  11/29/2013 FL2 transmitted to all facilities within larger geographic area on   Patient informed that his/her managed care company has contracts with or will negotiate with  certain facilities, including the following:   [facility] [facility] [employer]     Patient/family informed of bed offers received:  12/13/2013 Patient [facility] recommends and patient chooses bed [facility]    [facility]  12/[facility]/2013 Patient to be transferred to facility by [facility]  The following physician request were entered in Epic:   Additional Comments: [employer]  provided authorization for SNF placement.  Cori RazorJamie Mayelin Panos LCSW [phone]

## 2013-12-17 NOTE — Discharge Summary (Signed)
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 Cholecystectomy    . Ectopic pregnancy surgery    . Hip arthroplasty Left 05/24/2013    Procedure: ARTHROPLASTY UNIPOLAR HIP;  Surgeon: Lindsey Post, MD;  Location: [facility] OR;  Service: Orthopedics;  Laterality: Left;  . Colon resection N/A 11/27/2013    Procedure: DIAGNOSTIC LAPAROSCOPY OPEN RESECTION OF SMALL BOWEL;  Surgeon: Lindsey Cocking, MD;  Location: [facility] ORS;  Service: General;  Laterality: N/A;    Discharge Medications:   Medication List    STOP taking these medications       calcium carbonate 500 MG chewable tablet  Commonly known as:  TUMS - dosed in mg elemental calcium     sennosides-docusate sodium 8.6-50 MG tablet  Commonly known as:  SENOKOT-S      TAKE these medications       acetaminophen 325 MG tablet  Commonly known as:  TYLENOL  Take 2 tablets (650 mg total) by mouth every 6 (six) hours as needed for mild pain (or Fever >/= 101).      aspirin 325 MG tablet  Take 1 tablet (325 mg total) by mouth daily.     feeding supplement (ENSURE COMPLETE) Liqd  Take 237 mLs by mouth 2 (two) times daily between meals.     multivitamin with minerals Tabs tablet  Take 1 tablet by mouth daily.     ondansetron 4 MG/2ML Soln injection  Commonly known as:  ZOFRAN  Inject 2 mLs (4 mg total) into the vein every 6 (six) hours as needed for nausea or vomiting.     pantoprazole 40 MG tablet  Commonly known as:  PROTONIX  Take 1 tablet (40 mg total) by mouth daily.     promethazine 12.5 MG tablet  Commonly known as:  PHENERGAN  Take 1 tablet (12.5 mg total) by mouth 3 (three) times daily before meals.     SYSTANE 0.4-0.3 % Soln  Generic drug:  Polyethyl Glycol-Propyl Glycol  Place 2 drops into both eyes daily as needed (dry eyes).     vitamin C 500 MG tablet  Commonly known as:  ASCORBIC ACID  Take 500 mg by mouth daily.        Hospital Procedures: Abdominal laparoscopy converted to open laparotomy with small bowel resection (11/28/13)- Dr. Ezzard Standing  PICC line placement (12/03/13) TNA (2/24-12/11/13)  Ct Abdomen Pelvis Wo Contrast 11/24/2013   CLINICAL DATA:  Elevated white blood cell count, nausea and vomiting. Renal insufficiency.  EXAM: CT ABDOMEN AND PELVIS WITHOUT CONTRAST  TECHNIQUE: Multidetector  CT imaging of the abdomen and pelvis was performed following the standard protocol without intravenous contrast.  COMPARISON:  DG PELVIS PORTABLE dated 05/24/2013; CT ABD-PELV WO/W CM dated 07/27/2004  FINDINGS: There is a new moderate right pleural effusion and mild right basilar atelectasis. No clear evidence of basilar pneumonia.  Non IV contrast images demonstrate no focal hepatic lesion. Hensley cholecystectomy. The pancreas, spleen, adrenal glands, kidneys grossly normal.  Stomach is normal. The small bowel is diffusely dilated with loops of bowel measuring up to 3.6 cm. No transition point identified clearly. Note pneumatosis or portal  venous gas. No intraperitoneal free air. The most distal ileum (image 55, series 2) is a decompressed. The oral contrast does not progresses into the small bowel. Colon rectosigmoid colon are relatively normal. Small amount of stool throughout the colon.  Abdominal aorta is normal caliber with intimal calcification. No retroperitoneal periportal lymphadenopathy.  There is no free fluid the pelvis. The uterus and bladder grossly normal. The adnexa appear normal.  There is a left hip total arthroplasty. No aggressive osseous lesion. There is rotatory scoliosis of the spine with severe endplate osteophytosis.  IMPRESSION: 1. Moderate right pleural effusion with basilar atelectasis. No clear evidence of right lower lobe pneumonia. 2. Dilated small bowel with decompressed distal ileum. Findings suggestive of ileus versus partial obstruction. No evidence of pneumatosis or portal venous gas. 3. No free fluid or free air in the abdomen or pelvis.   Electronically Signed   By: Lindsey Hensley M.D.   On: 11/24/2013 18:55   Dg Chest 2 View  11/26/2013   CLINICAL DATA:  Cough and shortness of breath.  Pharyngitis.  EXAM: CHEST  2 VIEW  COMPARISON:  11/24/2013.  FINDINGS: Increased linear and patchy density in the right perihilar region and medial right lung base. The mildly progressive linear density at the medial left lung base with otherwise stable linear density at the left lung base. The cardiac silhouette remains borderline enlarged. Small bilateral pleural effusions, mildly increased. Diffuse osteopenia and left humeral bone island.  IMPRESSION: 1. Increased right perihilar and right basilar atelectasis and possible pneumonia. 2. Increased linear atelectasis or scarring at the left lung base. 3. Mild increase in size of small bilateral pleural effusions.   Electronically Signed   By: Gordan Payment M.D.   On: 11/26/2013 10:11   Dg Chest 2 View  11/24/2013   CLINICAL DATA:  Nausea, vomiting and hypoxia.  EXAM: CHEST  2  VIEW  COMPARISON:  Chest x-ray 05/23/2013.  FINDINGS: Emphysematous changes (hyperexpansion and pruning of the pulmonary vasculature in the periphery of the lungs) are again noted throughout the lungs. There are bibasilar opacities favored to reflect presence of small bilateral pleural effusions with dependent areas of subsegmental atelectasis. No evidence of pulmonary edema. No pneumothorax. No definite suspicious appearing pulmonary nodules or masses are identified by plain film examination. Heart size is within normal limits. The patient is rotated to the left on today's exam, resulting in distortion of the mediastinal contours and reduced diagnostic sensitivity and specificity for mediastinal pathology. Atherosclerosis in the thoracic aorta.  IMPRESSION: 1. Small bilateral pleural effusions with probable areas of dependent subsegmental atelectasis in the lung bases bilaterally. 2. Emphysematous changes redemonstrated. 3. Atherosclerosis.   Electronically Signed   By: Trudie Reed M.D.   On: 11/24/2013 10:59   Dg Abd 1 View  12/13/2013   CLINICAL DATA:  Evaluate barium progression  EXAM: ABDOMEN - 1 VIEW  COMPARISON:  Earlier film of the  same day  FINDINGS: Scattered residual barium in the right lower quadrant and right pelvis. Normal bowel gas pattern. Lumbar dextroscoliosis with multilevel spondylitic change. Left hip arthroplasty hardware partially seen.  IMPRESSION: Continued decrease in amount of residual colonic barium.   Electronically Signed   By: Oley Balm M.D.   On: 12/13/2013 15:10   Dg Abd 1 View  12/13/2013   CLINICAL DATA:  Residual barium.  EXAM: ABDOMEN - 1 VIEW  COMPARISON:  December 12, 2013.  FINDINGS: No abnormal bowel gas pattern is noted. The large amount of residual barium seen in left colon on prior exam is no longer visualized. Small amount of residual contrast is seen in the region of the cecum. Severe dextroscoliosis of thoracic spine is noted. Status Hensley left hip  arthroplasty.  IMPRESSION: No evidence of bowel obstruction or ileus is noted. Large amount of residual barium seen in left colon on prior exam is no longer present. Small amount of residual barium remains in the cecum.   Electronically Signed   By: Roque Lias M.D.   On: 12/13/2013 08:40   Dg Abd 1 View  12/12/2013   CLINICAL DATA:  eval for residual barium; pre ct a/p scan  EXAM: ABDOMEN - 1 VIEW  COMPARISON:  CT ABD/PELVIS W CM dated 12/11/2013, topogram  FINDINGS: Areas of residual dense barium project in the region of the cecum/ proximal ascending colon, mid transverse colon and throughout the descending colon and sigmoid colon regions. Air is seen within nondilated loops of small bowel. There is S-shaped scoliosis of the thoracolumbar spine and multilevel degenerative disc disease changes within the lumbar spine.  IMPRESSION: Residual areas of dense barium within the colon. Nonobstructive bowel gas pattern. The distribution and quantity of barium would degrade CT evaluation. Surveillance evaluation is recommended.   Electronically Signed   By: Salome Holmes M.D.   On: 12/12/2013 10:47   Dg Abd 1 View  12/09/2013   CLINICAL DATA:  Abdominal pain, nausea, vomiting, history of gastric neoplasia  EXAM: ABDOMEN - 1 VIEW  COMPARISON:  DG ABD 2 VIEWS dated 12/03/2013  FINDINGS: Air is seen within nondilated loops of large small bowel. Surgical clips identified within the right upper quadrant and a skin staple in the left upper quadrant. Dystrophic calcifications project in the region of the pelvis, likely reflecting a degenerating fibroid. The patient is status Hensley total left hip arthroplasty visualized portions appear intact. S-shaped scoliosis identified within the thoracolumbar spine is of the severe multilevel spondylosis.  IMPRESSION: Nonobstructive bowel gas pattern.   Electronically Signed   By: Salome Holmes M.D.   On: 12/09/2013 15:15   Ct Abdomen Pelvis W Contrast  12/14/2013   CLINICAL DATA:   Evaluate for abscess. Postop small bowel obstruction with resection.  EXAM: CT ABDOMEN AND PELVIS WITH CONTRAST  TECHNIQUE: Multidetector CT imaging of the abdomen and pelvis was performed using the standard protocol following bolus administration of intravenous contrast.  CONTRAST:  25mL OMNIPAQUE IOHEXOL 300 MG/ML SOLN, 80mL OMNIPAQUE IOHEXOL 300 MG/ML SOLN  COMPARISON:  11/24/2013  FINDINGS: BODY WALL: Unremarkable.  LOWER CHEST: Coronary atherosclerosis. There is a small right pleural effusion which appears simple. Bibasilar atelectasis.  ABDOMEN/PELVIS:  Liver: No focal abnormality.  Biliary: Cholecystectomy, which may account for biliary ductal dilation.  Pancreas: Unremarkable.  Spleen: Unremarkable.  Adrenals: Unremarkable.  Kidneys and ureters: Bilateral low dense renal lesions, presumably cysts although small size limits certainty.  Bladder: Gas in the bladder, likely from recent instrumentation.  Reproductive: Unremarkable.  Bowel: Enteroenterostomy in the right lower quadrant, likely the terminal ileum. Contrast has reached this portion, and there is no leak. No proximal obstruction. The anastomosis loops are mildly thickened, not unexpected after recent surgery. Liquid stool. Bulky dystrophic calcification which remains associated with the cecal base, related to remote appendectomy.  Retroperitoneum: No mass or adenopathy.  Peritoneum: No free fluid or gas.  Vascular: No acute abnormality.  OSSEOUS: Severe dextroscoliosis and degenerative disc change.  IMPRESSION: 1. No intra-abdominal findings to explain leukocytosis. No leak or intra-abdominal abscess after recent small bowel resection. 2. Basilar atelectasis and trace pleural effusions.   Electronically Signed   By: Tiburcio Pea M.D.   On: 12/14/2013 07:19   Dg Abd 2 Views  12/03/2013   CLINICAL DATA:  Small bowel obstruction  EXAM: ABDOMEN - 2 VIEW  COMPARISON:  11/27/2013  FINDINGS: NG tube in place is noted. Mild distended small bowel loops  mid abdomen and left upper abdomen with air-fluid levels consistent with small bowel obstruction. Left hip prosthesis again noted. Degenerative changes lumbar spine. Levoscoliosis of lumbar spine again noted. Postcholecystectomy surgical clips.  IMPRESSION: NG tube in place. Mild distended small bowel loops with air-fluid levels consistent with small bowel obstruction.   Electronically Signed   By: Natasha Mead M.D.   On: 12/03/2013 09:04   Dg Abd 2 Views  11/27/2013   CLINICAL DATA:  Abdominal distention.  EXAM: ABDOMEN - 2 VIEW  COMPARISON:  DG ABD 2 VIEWS dated 11/26/2013; DG ABD 2 VIEWS dated 11/25/2013; CT ABD/PELV WO CM dated 11/24/2013  FINDINGS: Soft tissue structures are unremarkable. Surgical clips noted in the right upper quadrant. Calcifications in pelvis consistent with fibroids. Aortoiliac atherosclerotic vascular disease. Dilated loops of small bowel remain. Dilatation has improved slightly from prior exam. There is mild small bowel wall thickening suggesting edema . The stomach is not distended. There is no free air. Bibasilar atelectasis present. Severe degenerative changes and scoliosis lumbar spine. Degenerative changes right hip. Left hip prosthesis.  IMPRESSION: 1. Interim slight improvement in small bowel distention. Small bowel distention remains. This is consistent with small bowel obstruction. The stomach is nondistended. No free air. Very mild small bowel wall edema cannot be excluded. 2. Prior cholecystectomy. 3. Bibasilar subsegmental atelectasis. 4. Calcified fibroids. 5. Left hip prosthesis. 6. Severe degenerative changes and scoliosis lumbar spine.   Electronically Signed   By: Maisie Fus  Register   On: 11/27/2013 07:12   Dg Abd 2 Views  11/26/2013   CLINICAL DATA:  Abdominal pain and distention.  EXAM: ABDOMEN - 2 VIEW  COMPARISON:  Yesterday.  FINDINGS: Mild increase in caliber of multiple dilated small bowel loops containing multiple air-fluid levels. No colonic gas seen. No free  peritoneal air. Cholecystectomy clips. Stable left hip prosthesis. Marked thoracolumbar degenerative changes and scoliosis are again demonstrated as well as small calcified uterine fibroids.  IMPRESSION: Progressive small bowel obstruction.   Electronically Signed   By: Gordan Payment M.D.   On: 11/26/2013 10:13   Dg Abd 2 Views  11/25/2013   CLINICAL DATA:  History of partial small bowel obstruction  EXAM: ABDOMEN - 2 VIEW  COMPARISON:  CT abdomen pelvis of 11/24/2013  FINDINGS: There are persistent air-fluid levels with gaseous distention of small bowel consistent with partial small bowel obstruction. No free air is seen on the left lateral decubitus of the abdomen. A thoracolumbar scoliosis is noted with diffuse degenerative change.  IMPRESSION: Persistent small bowel obstruction.  No free air.  Electronically Signed   By: Dwyane Dee M.D.   On: 11/25/2013 15:06   Dg Ugi W/small Bowel  12/10/2013   CLINICAL DATA:  Nausea and vomiting status Hensley exploratory laparotomy and small bowel resection for obstruction.  EXAM: UPPER GI SERIES WITH SMALL BOWEL FOLLOW-THROUGH  TECHNIQUE: Single contrast upper GI series using thin barium. Subsequently, serial images of the small bowel were obtained including spot views of the terminal ileum.  COMPARISON:  Abdominal radiographs 12/09/2013 and earlier. CT abdomen pelvis 11/24/2013.  FLUOROSCOPY TIME:  5 min 24 seconds  FINDINGS: Scout radiograph demonstrates multiple surgical clips in the right mid abdomen. Gas is present in scattered loops of nondilated small and large bowel without evidence of obstruction. Marked multilevel degenerative disc disease and dextroscoliosis are noted in the lumbar spine. Dystrophic calcification in the pelvis is unchanged and likely represents a fibroid. Left total hip arthroplasty is partially visualized.  Evaluation of the esophagus demonstrated some initial release of the contrast bolus from the proximal wave with subsequent stasis in the  esophagus. No definite tertiary contractions were seen. Two subsequent individual swallows demonstrated a more effective primary wave without significant dysmotility identified. No esophageal mass or stricture was identified. There is a small sliding-type hiatal hernia. The stomach was normal in configuration without gross mass or obstruction identified. Duodenum was normally positioned.  Small bowel transit time was approximately 70 min. Evaluation of pelvic small bowel loops was mildly limited due to superimposition of loops upon one another and decreased patient mobility. However, some separation of pelvic small bowel loops was achieved with prone positioning and using the compression paddle. There is no evidence of small bowel obstruction. No fixed area of small bowel narrowing was identified. The terminal ileum was normal in appearance.  IMPRESSION: 1. Mild, nonspecific esophageal dysmotility. 2. Small sliding hiatal hernia. 3. No evidence of small bowel obstruction or stricture.   Electronically Signed   By: Sebastian Ache   On: 12/10/2013 15:10    History of Present Illness: 80 year old female who lives at home with close supervision by son and daughter-in-law, with history of short-term memory loss consistent with dementia, last MMSE 25/30 last fall, chronic and intermittent dizziness with no evidence of orthostasis, osteoporosis with history of left hip fracture last year, chronic kidney disease stage III, mild anemia, hypertension, and history of right frontal stroke with left-sided hemiparesis resulting in gait instability dating back to 2012, she is also status Hensley laparoscopic cholecystectomy in 2010. Again her son looks in on her multiple times a day but she does live by herself, with significant short-term memory loss making review of systems somewhat questionable. However per son's report, the patient on February 13 as well as February 14 has had recurrent nausea with her breakfast, decreased oral  intake, anorexic with dinner, not taking in very much at all, unclear she's had a bowel movement, denies fevers, chills, chest pain, shortness of breath, recent fall, visual changes, sore throat, swallowing difficulties, lower extremity edema, gross hematuria or blood in stool as far as we know. Daughter-in-law did notice cloudy foul-smelling urine this morning. Given refractory nausea vomiting and anorexia over 2-3 days making a viral gastritis less likely, family brought her into the emergency room. In emergency room the patient has remained hemodynamically stable however the patient has significant leukocytosis with a white blood cell count of 30,000 and has evidence of prerenal azotemia, but no clear evidence of infection. Some abdominal discomfort bowels called to admit for further evaluation and management.  Hospital Course: Ms. Janota was admitted to a medical bed. She underwent a CT of the abdomen and pelvis with contrast to evaluate her nausea and vomiting on 2/15 which showed findings consistent with a small bowel obstruction. She was placed on IV fluid hydration and made n.p.o. She was treated with empiric IV Zosyn due do to ongoing fever and leukocytosis though no source of infection was initially identified. Her bowel exam was relatively benign with no significant abdominal tenderness but absence of bowel sounds. Due to lack of improvement with conservative measures and persistent leukocytosis, general surgery was consulted on 2/17. Followup KUB showed no improvement so she was taken to the operating room on 2/19. Abdominal laparoscopy was initiated but converted to an open laparotomy with small bowel resection when she was found to have a closed loop small bowel obstruction with associated perforation.  This was successfully repaired and she was transferred to the ICU for postoperative monitoring. She received Zosyn for an additional 7 days postoperatively to cover possible peritonitis in the  setting of her perforation.  Her leukocytosis and fever resolved prior to discontinuation of antibiotics.  Her postoperative course was prolonged due to very low recovery of bowel function.  Once her bowel sounds were present and she had a bowel movement she was started on a clear liquid diet. Attempts to advance her diet beyond clear liquids was delayed due to persistent nausea and vomiting after eating. On 2/24 a PICC line was placed in TNA initiated for severe protein calorie malnutrition and prolonged n.p.o. status. This was continued until 3/4 when, in an effort to see if it was related to her ongoing nausea and vomiting, it was held. KUB, upper GI with small bowel follow-through, and CT of the abdomen and pelvis were performed sequentially to evaluate her postoperative nausea and vomiting and showed no acute findings. Ultimately, she was treated with Phenergan prior to each meal which has helped her to prevent vomiting after eating. Over the past 2-3 days she has been able to advance her diet to a soft mechanical diet which he is now tolerating better.  She did have an isolated episode of bright red blood per rectum on 2/28. Her Lovenox was held but she was continued on aspirin due to her prior history of stroke. Hemoglobin drifted down to around 8.0 but improved after an IV iron infusion. She did not require transfusion and had no further episodes of blood in her stool. In the absence of any recurrent bleeding no further evaluation was performed.  The patient does have severe dementia which is very apparent during this hospitalization. She does not remember her surgery and often asked why she has ended on her abdomen. This has made her history and examination difficult and likely contributed to her minimal symptomatology despite bowel perforation. Despite this, she has been very cooperative throughout her hospitalization with slight improvement in her memory has her condition improved. We will consider  treating this with Aricept and/or Namenda as an outpatient but this is been held due to her medical issues.    At this time, the patient is stable for discharge.  Due to her profound weakness and prolonged hospitalization she will be discharged to a skilled nursing facility for rehabilitation prior to return home. She may need additional assistance at home due to her underlying dementia.  Day of Discharge Exam BP 146/78  Pulse 93  Temp(Src) 100 F (37.8 C) (Oral)  Resp 18  Ht 5\' 5"  (1.651 m)  Wt  57.3 kg (126 lb 5.2 oz)  BMI 21.02 kg/m2  SpO2 91%  Physical Exam: General appearance: alert and no distress Eyes: no scleral icterus Throat: oropharynx moist without erythema Resp: clear to auscultation bilaterally Cardio: regular rate and rhythm, S1, S2 normal, no murmur, click, rub or gallop GI: soft, non-tender; bowel sounds normal; no masses,  no organomegaly Extremities: no clubbing, cyanosis or edema  Discharge Labs:  Recent Labs  12/15/13 0455  NA 141  K 3.7  CL 104  CO2 27  GLUCOSE 95  BUN 8  CREATININE 0.82  CALCIUM 8.7    Recent Labs  12/15/13 0455  AST 15  ALT 7  ALKPHOS 53  BILITOT 0.3  PROT 6.4  ALBUMIN 2.7*    Recent Labs  12/16/13 0500 12/17/13 0500  WBC 7.8 9.5  HGB 8.6* 9.3*  HCT 26.5* 28.8*  MCV 92.7 91.7  PLT 175 194   Lab Results  Component Value Date   INR 1.10 02/25/2011     Discharge instructions:     Discharge Orders   Future Orders Complete By Expires   Diet general  As directed    Comments:     Soft Mechanical   Discharge instructions  As directed    Comments:     Given phenergan prior to each meal.  Encourage pos.  Continue PT/OT.  Change dressing daily with dry guaze.   Increase activity slowly  As directed       Disposition: To skilled nursing facility for rehabilitation  Follow-up Appts: [facility] discharge from rehabilitation facility  Condition on  Discharge: Stable/improved  Tests Needing Follow-up: None  Time with discharge activities: 45 minutes  Signed: Martha ClanShaw, Akif Weldy 12/17/2013, 6:35 AM

## 2013-12-17 NOTE — Progress Notes (Signed)
[facility] Patient ready for discharge.

## 2013-12-17 NOTE — Discharge Instructions (Addendum)
Intestinal Obstruction An intestinal obstruction is a blockage of the intestine. It can be caused by a physical blockage or by a problem of abnormal function of the intestine.  CAUSES   Adhesions from previous surgeries.  Cancer or tumor.  A hernia, which is a condition in which a portion of the bowel bulges out through an opening or weakness in the abdomen. This sometimes squeezes the bowel.  A swallowed object.  Blockage (impaction) with worms is common in third world countries.  A twisting of the bowel or telescoping of a portion of the bowel into another portion (intussusceptions).  Anything that stops food from going through from the stomach to the anus. SYMPTOMS  Symptoms of bowel obstruction may include abdominal bloating, nausea, vomiting, explosive diarrhea, or explosive stool. You may not be able to hear your normal bowel sounds (such as "growling in your stomach"). You may also stop having bowel movements or passing gas. DIAGNOSIS  Usually this condition is diagnosed with a history and an examination. Often, lab studies (blood work) and X-rays may be used to find the cause. TREATMENT  The main treatment for this condition is to rest the intestine. Often, the obstruction may relieve itself and allow the intestine to start working again. Think of the intestine like a balloon that is blown up (filled with trapped food and water that has squeezed into a hole or area that it cannot get through).   If the obstruction is complete, a nasogastric (NG) tube is passed through the nose and into the stomach. It is then connected to suction to keep the stomach emptied out. This also helps treat the nausea and vomiting.  If there is an imbalance in the electrolytes, they are corrected with intravenous fluids. These fluids have the proper chemicals in them to correct the problem.  If the reason for the blockage does not get better with conservative (nonsurgical) treatment, surgery may be  necessary. Sometimes, surgery is done immediately if your surgeon knows that the problem is not going to get better with conservative treatment. PROGNOSIS  Depending on what the problem is, most of these problems can be treated by your caregivers with good results. Your caregiver will discuss with you the best course of action to take. FOLLOWING SURGERY Seek immediate medical attention if you have:  Increasing abdominal pain, repeated vomiting, dehydration, or fainting.  Severe weakness, chest pain, or back pain.  Blood in your vomit or stool.  Tarry stool. Document Released: 12/17/2003 Document Revised: 01/21/2013 Document Reviewed: 05/16/2008 [facility] Patient Information 2014 [place], [place].  CCS      [facility], [place] [phone]  OPEN ABDOMINAL SURGERY: POST OP INSTRUCTIONS  Always review your discharge instruction sheet given to you by the facility where your surgery was performed.  IF YOU HAVE DISABILITY OR FAMILY LEAVE FORMS, YOU MUST BRING THEM TO THE OFFICE FOR PROCESSING.  PLEASE DO NOT GIVE THEM TO YOUR DOCTOR.  1. A prescription for pain medication may be given to you upon discharge.  Take your pain medication as prescribed, if needed.  If narcotic pain medicine is not needed, then you may take acetaminophen (Tylenol) or ibuprofen (Advil) as needed. 2. Take your usually prescribed medications unless otherwise directed. 3. If you need a refill on your pain medication, please contact your pharmacy. They will contact our office to request authorization.  Prescriptions will not be filled after 5pm or on week-ends. 4. You should follow a light diet the first few days after arrival home,  such as soup and crackers, pudding, etc.unless your doctor has advised otherwise. A high-fiber, low fat diet can be resumed as tolerated.   Be sure to include lots of fluids daily. Most patients will experience some swelling and bruising on the chest and neck area.  Ice packs will help.   Swelling and bruising can take several days to resolve 5. Most patients will experience some swelling and bruising in the area of the incision. Ice pack will help. Swelling and bruising can take several days to resolve..  6. It is common to experience some constipation if taking pain medication after surgery.  Increasing fluid intake and taking a stool softener will usually help or prevent this problem from occurring.  A mild laxative (Milk of Magnesia or Miralax) should be taken according to package directions if there are no bowel movements after 48 hours. 7.  You may have steri-strips (small skin tapes) in place directly over the incision.  These strips should be left on the skin for 7-10 days.  If your surgeon used skin glue on the incision, you may shower in 24 hours.  The glue will flake off over the next 2-3 weeks.  Any sutures or staples will be removed at the office during your follow-up visit. You may find that a light gauze bandage over your incision may keep your staples from being rubbed or pulled. You may shower and replace the bandage daily. 8. ACTIVITIES:  You may resume regular (light) daily activities beginning the next day--such as daily self-care, walking, climbing stairs--gradually increasing activities as tolerated.  You may have sexual intercourse when it is comfortable.  Refrain from any heavy lifting or straining until approved by your doctor. a. You may drive when you no longer are taking prescription pain medication, you can comfortably wear a seatbelt, and you can safely maneuver your car and apply brakes b. Return to Work: ___________________________________ 9. You should see your doctor in the office for a follow-up appointment approximately two weeks after your surgery.  Make sure that you call for this appointment within a day or two after you arrive home to insure a convenient appointment time. OTHER INSTRUCTIONS:   _____________________________________________________________ _____________________________________________________________  WHEN TO CALL YOUR DOCTOR: 1. Fever over 101.0 2. Inability to urinate 3. Nausea and/or vomiting 4. Extreme swelling or bruising 5. Continued bleeding from incision. 6. Increased pain, redness, or drainage from the incision. 7. Difficulty swallowing or breathing 8. Muscle cramping or spasms. 9. Numbness or tingling in hands or feet or around lips.  The clinic staff is available to answer your questions during regular business hours.  Please dont hesitate to call and ask to speak to one of the nurses if you have concerns.  For further questions, please visit [email or web address]  CONTINUE WET TO DRY DRESSING TO ABDOMEN TWICE A DAY USING STERILE 4 X 4'S, AND NORMAL SALINE. This will continue to heal from the bottom to the top of the wound.  Call if you have any questions or issues.

## 2013-12-17 NOTE — Progress Notes (Signed)
I have seen and examined the pt and agree with PA-Jenning's progress note.  

## 2013-12-17 NOTE — Progress Notes (Signed)
20 Days Post-Op  Subjective: She does not remember surgery, she says she ate some, but she doesn't eat that much.  She does not remember having nausea, currently.  Objective: Vital signs in last 24 hours: Temp:  [98.3 F (36.8 C)-100 F (37.8 C)] 100 F (37.8 C) (03/10 0502) Pulse Rate:  [81-93] 93 (03/10 0502) Resp:  [16-18] 18 (03/10 0502) BP: (125-160)/(54-78) 146/78 mmHg (03/10 0502) SpO2:  [91 %-96 %] 91 % (03/10 0502) Weight:  [55.021 kg (121 lb 4.8 oz)-57.3 kg (126 lb 5.2 oz)] 55.021 kg (121 lb 4.8 oz) (03/10 0648) Last BM Date: 12/14/13 600 Po recorded, 1 episode of emesis and 1 stool recorded yesterday Intake/Output from previous day: 03/09 0701 - 03/10 0700 In: 1800 [P.O.:600; I.V.:1200] Out: 3826 [Urine:3825; Emesis/NG output:1] Intake/Output this shift:    General appearance: alert, cooperative and no distress GI: soft, non-tender; bowel sounds normal; no masses,  no organomegaly and open abdominal wound looks fine about 1 cm deep, clean and pink.  Lab Results:   Recent Labs  12/16/13 0500 12/17/13 0500  WBC 7.8 9.5  HGB 8.6* 9.3*  HCT 26.5* 28.8*  PLT 175 194    BMET  Recent Labs  12/15/13 0455  NA 141  K 3.7  CL 104  CO2 27  GLUCOSE 95  BUN 8  CREATININE 0.82  CALCIUM 8.7   PT/INR No results found for this basename: LABPROT, INR,  in the last 72 hours   Recent Labs Lab 12/14/13 0428 12/15/13 0455  AST 19 15  ALT 10 7  ALKPHOS 54 53  BILITOT 0.4 0.3  PROT 6.2 6.4  ALBUMIN 2.7* 2.7*     Lipase     Component Value Date/Time   LIPASE 12 11/24/2013 0950     Studies/Results: No results found.  Medications: . aspirin  325 mg Oral Daily  . feeding supplement (ENSURE COMPLETE)  237 mL Oral BID BM  . pantoprazole  40 mg Oral Daily  . promethazine  12.5 mg Oral TID AC    Assessment/Plan Small bowel obstruction, with intraoperative finding of; Closed loop small bowel obstruction with perforation.  Abdominal laparoscopy converted  to open laparotomy with small bowel resection. Hiatal hernia.11/28/2013, Dr. Ezzard StandingNewman.  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  I have put follow up information in the AVS.   LOS: 23 days    Vaughn Beaumier 12/17/2013

## 2013-12-19 ENCOUNTER — Encounter (HOSPITAL_COMMUNITY): Payer: Self-pay | Admitting: *Deleted

## 2013-12-19 NOTE — Progress Notes (Signed)
[facility] [address] Pt SDW assessment was completed by Simmie DaviesBethel Ayldabo ( pt nurse). Unable to assess for Obstructive Sleep Apnea and pt education. [facility] to December 17, 2013; labs, EKG and chest x ray in epic.

## 2013-12-20 ENCOUNTER — Encounter (HOSPITAL_COMMUNITY): Payer: Self-pay | Admitting: *Deleted

## 2013-12-20 ENCOUNTER — Observation Stay (HOSPITAL_COMMUNITY)
Admission: RE | Admit: 2013-12-20 | Discharge: 2013-12-21 | Disposition: A | Discharge status: Skilled Nursing Facility | Payer: Medicare HMO | Source: Ambulatory Visit | Attending: Orthopedic Surgery | Admitting: Orthopedic Surgery

## 2013-12-20 ENCOUNTER — Encounter (HOSPITAL_COMMUNITY)
Admission: RE | Disposition: A | Discharge status: Skilled Nursing Facility | Payer: Self-pay | Source: Ambulatory Visit | Attending: Orthopedic Surgery

## 2013-12-20 ENCOUNTER — Ambulatory Visit (HOSPITAL_COMMUNITY): Payer: Medicare HMO | Admitting: Anesthesiology

## 2013-12-20 ENCOUNTER — Encounter (HOSPITAL_COMMUNITY): Payer: Medicare HMO | Admitting: Anesthesiology

## 2013-12-20 DIAGNOSIS — S62509A Fracture of unspecified phalanx of unspecified thumb, initial encounter for closed fracture: Secondary | ICD-10-CM | POA: Diagnosis present

## 2013-12-20 HISTORY — PX: OPEN REDUCTION INTERNAL FIXATION (ORIF) METACARPAL: SHX6234

## 2013-12-20 HISTORY — DX: Unsteadiness on feet: R26.81

## 2013-12-20 HISTORY — DX: Unspecified dislocation of left thumb, initial encounter: S63.105A

## 2013-12-20 SURGERY — OPEN REDUCTION INTERNAL FIXATION (ORIF) METACARPAL
Anesthesia: General | Site: Hand | Laterality: Left

## 2013-12-20 MED ORDER — CEFAZOLIN SODIUM 1-5 GM-% IV SOLN
1.0000 g | INTRAVENOUS | Status: AC
  Administered 2013-12-20: 1 g via INTRAVENOUS
  Filled 2013-12-20: qty 50

## 2013-12-20 MED ORDER — FENTANYL CITRATE 0.05 MG/ML IJ SOLN
INTRAMUSCULAR | Status: AC
  Filled 2013-12-20: qty 5

## 2013-12-20 MED ORDER — CEFAZOLIN SODIUM-DEXTROSE 2-3 GM-% IV SOLR
2.0000 g | INTRAVENOUS | Status: AC
  Administered 2013-12-20: 2 g via INTRAVENOUS

## 2013-12-20 MED ORDER — BUPIVACAINE HCL (PF) 0.25 % IJ SOLN
INTRAMUSCULAR | Status: AC
  Filled 2013-12-20: qty 30

## 2013-12-20 MED ORDER — HYDROCODONE-ACETAMINOPHEN 5-325 MG PO TABS: 1.0000 | ORAL_TABLET | ORAL | Status: DC | PRN

## 2013-12-20 MED ORDER — ASPIRIN 325 MG PO TABS
325.0000 mg | ORAL_TABLET | Freq: Every day | ORAL | Status: DC
  Administered 2013-12-20 – 2013-12-21 (×2): 325 mg via ORAL
  Filled 2013-12-20 (×2): qty 1

## 2013-12-20 MED ORDER — FENTANYL CITRATE 0.05 MG/ML IJ SOLN: 25.0000 ug | INTRAMUSCULAR | Status: DC | PRN

## 2013-12-20 MED ORDER — FENTANYL CITRATE 0.05 MG/ML IJ SOLN
INTRAMUSCULAR | Status: DC | PRN
  Administered 2013-12-20: 25 ug via INTRAVENOUS

## 2013-12-20 MED ORDER — LACTATED RINGERS IV SOLN
INTRAVENOUS | Status: DC
  Administered 2013-12-20: 23:00:00 via INTRAVENOUS

## 2013-12-20 MED ORDER — CEFAZOLIN SODIUM-DEXTROSE 2-3 GM-% IV SOLR
INTRAVENOUS | Status: AC
  Filled 2013-12-20: qty 50

## 2013-12-20 MED ORDER — ADULT MULTIVITAMIN W/MINERALS CH
1.0000 | ORAL_TABLET | Freq: Every day | ORAL | Status: DC
  Administered 2013-12-20 – 2013-12-21 (×2): 1 via ORAL
  Filled 2013-12-20 (×2): qty 1

## 2013-12-20 MED ORDER — PROMETHAZINE HCL 25 MG/ML IJ SOLN: 6.2500 mg | INTRAMUSCULAR | Status: DC | PRN

## 2013-12-20 MED ORDER — 0.9 % SODIUM CHLORIDE (POUR BTL) OPTIME
TOPICAL | Status: DC | PRN
  Administered 2013-12-20: 1000 mL

## 2013-12-20 MED ORDER — ONDANSETRON HCL 4 MG/2ML IJ SOLN: 4.0000 mg | Freq: Four times a day (QID) | INTRAMUSCULAR | Status: DC | PRN

## 2013-12-20 MED ORDER — ENSURE COMPLETE PO LIQD
237.0000 mL | Freq: Two times a day (BID) | ORAL | Status: DC
  Administered 2013-12-21 (×2): 237 mL via ORAL

## 2013-12-20 MED ORDER — PROPOFOL 10 MG/ML IV BOLUS
INTRAVENOUS | Status: DC | PRN
  Administered 2013-12-20: 80 mg via INTRAVENOUS

## 2013-12-20 MED ORDER — BUPIVACAINE HCL (PF) 0.25 % IJ SOLN
INTRAMUSCULAR | Status: DC | PRN
  Administered 2013-12-20: 30 mL

## 2013-12-20 MED ORDER — LACTATED RINGERS IV SOLN
INTRAVENOUS | Status: DC
  Administered 2013-12-20: 13:00:00 via INTRAVENOUS

## 2013-12-20 MED ORDER — LIDOCAINE HCL (CARDIAC) 20 MG/ML IV SOLN
INTRAVENOUS | Status: DC | PRN
Start: 2013-12-20 — End: 2013-12-20
  Administered 2013-12-20: 60 mg via INTRAVENOUS

## 2013-12-20 MED ORDER — PROPOFOL 10 MG/ML IV BOLUS
INTRAVENOUS | Status: AC
  Filled 2013-12-20: qty 20

## 2013-12-20 MED ORDER — ACETAMINOPHEN 325 MG PO TABS: 650.0000 mg | ORAL_TABLET | Freq: Four times a day (QID) | ORAL | Status: DC | PRN

## 2013-12-20 MED ORDER — MORPHINE SULFATE 2 MG/ML IJ SOLN: 1.0000 mg | INTRAMUSCULAR | Status: DC | PRN

## 2013-12-20 MED ORDER — VITAMIN C 500 MG PO TABS
500.0000 mg | ORAL_TABLET | Freq: Every day | ORAL | Status: DC
  Administered 2013-12-20 – 2013-12-21 (×2): 500 mg via ORAL
  Filled 2013-12-20 (×2): qty 1

## 2013-12-20 MED ORDER — CHLORHEXIDINE GLUCONATE 4 % EX LIQD
60.0000 mL | Freq: Once | CUTANEOUS | Status: DC
Start: 2013-12-20 — End: 2013-12-20
  Filled 2013-12-20: qty 60

## 2013-12-20 MED ORDER — PANTOPRAZOLE SODIUM 40 MG PO TBEC
40.0000 mg | DELAYED_RELEASE_TABLET | Freq: Every day | ORAL | Status: DC
  Administered 2013-12-20 – 2013-12-21 (×2): 40 mg via ORAL
  Filled 2013-12-20 (×2): qty 1

## 2013-12-20 MED ORDER — CEFAZOLIN SODIUM 1-5 GM-% IV SOLN
1.0000 g | Freq: Three times a day (TID) | INTRAVENOUS | Status: DC
  Administered 2013-12-21 (×2): 1 g via INTRAVENOUS
  Filled 2013-12-20 (×4): qty 50

## 2013-12-20 SURGICAL SUPPLY — 57 items
BANDAGE ELASTIC 3 VELCRO ST LF (GAUZE/BANDAGES/DRESSINGS) IMPLANT
BANDAGE ELASTIC 4 VELCRO ST LF (GAUZE/BANDAGES/DRESSINGS) ×2 IMPLANT
BANDAGE GAUZE 4  KLING STR (GAUZE/BANDAGES/DRESSINGS) ×2 IMPLANT
BANDAGE GAUZE ELAST BULKY 4 IN (GAUZE/BANDAGES/DRESSINGS) ×4 IMPLANT
BNDG COHESIVE 1X5 TAN STRL LF (GAUZE/BANDAGES/DRESSINGS) IMPLANT
CAP PIN ORTHO PINK (CAP) IMPLANT
CAP PIN PROTECTOR ORTHO WHT (CAP) IMPLANT
CORDS BIPOLAR (ELECTRODE) ×3 IMPLANT
COVER SURGICAL LIGHT HANDLE (MISCELLANEOUS) ×3 IMPLANT
CUFF TOURNIQUET SINGLE 18IN (TOURNIQUET CUFF) ×3 IMPLANT
CUFF TOURNIQUET SINGLE 24IN (TOURNIQUET CUFF) IMPLANT
DRAPE OEC MINIVIEW 54X84 (DRAPES) IMPLANT
DRAPE SURG 17X23 STRL (DRAPES) ×3 IMPLANT
DRSG ADAPTIC 3X8 NADH LF (GAUZE/BANDAGES/DRESSINGS) ×2 IMPLANT
GAUZE SPONGE 2X2 8PLY STRL LF (GAUZE/BANDAGES/DRESSINGS) IMPLANT
GAUZE XEROFORM 1X8 LF (GAUZE/BANDAGES/DRESSINGS) ×2 IMPLANT
GAUZE XEROFORM 5X9 LF (GAUZE/BANDAGES/DRESSINGS) ×2 IMPLANT
GLOVE BIOGEL M STRL SZ7.5 (GLOVE) ×1 IMPLANT
GLOVE SS BIOGEL STRL SZ 8 (GLOVE) ×1 IMPLANT
GLOVE SUPERSENSE BIOGEL SZ 8 (GLOVE) ×2
GOWN STRL REUS W/ TWL LRG LVL3 (GOWN DISPOSABLE) ×2 IMPLANT
GOWN STRL REUS W/ TWL XL LVL3 (GOWN DISPOSABLE) ×3 IMPLANT
GOWN STRL REUS W/TWL LRG LVL3 (GOWN DISPOSABLE) ×3
GOWN STRL REUS W/TWL XL LVL3 (GOWN DISPOSABLE) ×3
K-WIRE SMTH SNGL TROCAR .028X4 (WIRE)
KIT BASIN OR (CUSTOM PROCEDURE TRAY) ×3 IMPLANT
KIT ROOM TURNOVER OR (KITS) ×3 IMPLANT
KWIRE SMTH SNGL TROCAR .028X4 (WIRE) IMPLANT
MANIFOLD NEPTUNE II (INSTRUMENTS) ×3 IMPLANT
NDL HYPO 25GX1X1/2 BEV (NEEDLE) IMPLANT
NEEDLE HYPO 25GX1X1/2 BEV (NEEDLE) IMPLANT
NS IRRIG 1000ML POUR BTL (IV SOLUTION) ×3 IMPLANT
PACK ORTHO EXTREMITY (CUSTOM PROCEDURE TRAY) ×3 IMPLANT
PAD ARMBOARD 7.5X6 YLW CONV (MISCELLANEOUS) ×6 IMPLANT
PAD CAST 4YDX4 CTTN HI CHSV (CAST SUPPLIES) IMPLANT
PADDING CAST ABS 4INX4YD NS (CAST SUPPLIES) ×2
PADDING CAST ABS COTTON 4X4 ST (CAST SUPPLIES) IMPLANT
PADDING CAST COTTON 4X4 STRL (CAST SUPPLIES)
SOLUTION BETADINE 4OZ (MISCELLANEOUS) ×3 IMPLANT
SPLINT FIBERGLASS 3X35 (CAST SUPPLIES) ×2 IMPLANT
SPONGE GAUZE 2X2 STER 10/PKG (GAUZE/BANDAGES/DRESSINGS)
SPONGE GAUZE 4X4 12PLY (GAUZE/BANDAGES/DRESSINGS) ×2 IMPLANT
SPONGE SCRUB IODOPHOR (GAUZE/BANDAGES/DRESSINGS) ×3 IMPLANT
SUCTION FRAZIER TIP 10 FR DISP (SUCTIONS) IMPLANT
SUT FIBERWIRE 4-0 18 DIAM BLUE (SUTURE) ×3
SUT MERSILENE 4 0 P 3 (SUTURE) IMPLANT
SUT PROLENE 4 0 PS 2 18 (SUTURE) ×2 IMPLANT
SUT VIC AB 2-0 CT1 27 (SUTURE)
SUT VIC AB 2-0 CT1 TAPERPNT 27 (SUTURE) IMPLANT
SUTURE FIBERWR 4-0 18 DIA BLUE (SUTURE) IMPLANT
SYR CONTROL 10ML LL (SYRINGE) IMPLANT
TOWEL OR 17X24 6PK STRL BLUE (TOWEL DISPOSABLE) ×3 IMPLANT
TOWEL OR 17X26 10 PK STRL BLUE (TOWEL DISPOSABLE) ×3 IMPLANT
TUBE CONNECTING 12'X1/4 (SUCTIONS)
TUBE CONNECTING 12X1/4 (SUCTIONS) IMPLANT
UNDERPAD 30X30 INCONTINENT (UNDERPADS AND DIAPERS) ×3 IMPLANT
WATER STERILE IRR 1000ML POUR (IV SOLUTION) ×3 IMPLANT

## 2013-12-20 NOTE — Progress Notes (Signed)
[facility] request that if we send patient back tonight that we have her transported via [facility].

## 2013-12-20 NOTE — Anesthesia Procedure Notes (Signed)
Procedure Name: LMA Insertion Date/Time: 12/20/2013 6:34 PM Performed by: Molli HazardGORDON, Monaye Blackie M Pre-anesthesia Checklist: Patient identified, Emergency Drugs available, Suction available, Patient being monitored and Timeout performed Patient Re-evaluated:Patient Re-evaluated prior to inductionOxygen Delivery Method: Circle system utilized Preoxygenation: Pre-oxygenation with 100% oxygen Intubation Type: IV induction Ventilation: Mask ventilation without difficulty LMA: LMA inserted LMA Size: 4.0 Number of attempts: 1 Placement Confirmation: positive ETCO2 and breath sounds checked- equal and bilateral Tube secured with: Tape Dental Injury: Teeth and Oropharynx as per pre-operative assessment

## 2013-12-20 NOTE — Op Note (Signed)
See dictation #[id number]  Dominica SeverinWilliam Daniyla Pfahler M.D.

## 2013-12-20 NOTE — Progress Notes (Signed)
Wheelchair and one bag taken to PACU

## 2013-12-20 NOTE — Anesthesia Postprocedure Evaluation (Signed)
  Anesthesia Post-op Note  Patient: Lindsey Hensley  Procedure(s) Performed: Procedure(s): Open Reduction MCP Dislocation (Left)  Patient Location: PACU  Anesthesia Type:General  Level of Consciousness: awake  Airway and Oxygen Therapy: Patient Spontanous Breathing  Post-op Pain: mild  Post-op Assessment: Post-op Vital signs reviewed, Patient's Cardiovascular Status Stable, Respiratory Function Stable, Patent Airway, No signs of Nausea or vomiting and Pain level controlled  Post-op Vital Signs: Reviewed and stable  Complications: No apparent anesthesia complications

## 2013-12-20 NOTE — OR Nursing (Signed)
Cleaned pts upper and lower dentures and placed them back in her mouth.

## 2013-12-20 NOTE — Transfer of Care (Signed)
Immediate Anesthesia Transfer of Care Note  Patient: Lindsey Hensley  Procedure(s) Performed: Procedure(s): Open Reduction MCP Dislocation (Left)  Patient Location: PACU  Anesthesia Type:General  Level of Consciousness: awake and alert   Airway & Oxygen Therapy: Patient Spontanous Breathing and Patient connected to nasal cannula oxygen  Post-op Assessment: Report given to PACU RN and Post -op Vital signs reviewed and stable  Post vital signs: Reviewed and stable  Complications: No apparent anesthesia complications

## 2013-12-20 NOTE — Anesthesia Preprocedure Evaluation (Signed)
Anesthesia Evaluation  Patient identified by MRN, date of birth, ID band Patient awake    Reviewed: Allergy & Precautions, H&P , NPO status , Patient's Chart, lab work & pertinent test results  Airway Mallampati: II TM Distance: >3 FB Neck ROM: Full    Dental no notable dental hx.    Pulmonary former smoker,  breath sounds clear to auscultation  Pulmonary exam normal       Cardiovascular negative cardio ROS  Rhythm:Regular Rate:Normal     Neuro/Psych CVA, Residual Symptoms negative psych ROS   GI/Hepatic negative GI ROS, Neg liver ROS, GERD-  ,  Endo/Other  negative endocrine ROS  Renal/GU Renal disease     Musculoskeletal negative musculoskeletal ROS (+)   Abdominal   Peds negative pediatric ROS (+)  Hematology  (+) anemia ,   Anesthesia Other Findings   Reproductive/Obstetrics negative OB ROS                           Anesthesia Physical Anesthesia Plan  ASA: III  Anesthesia Plan: General   Post-op Pain Management:    Induction: Intravenous  Airway Management Planned: LMA  Additional Equipment:   Intra-op Plan:   Post-operative Plan: Extubation in OR  Informed Consent: I have reviewed the patients History and Physical, chart, labs and discussed the procedure including the risks, benefits and alternatives for the proposed anesthesia with the patient or authorized representative who has indicated his/her understanding and acceptance.     Plan Discussed with: CRNA and Surgeon  Anesthesia Plan Comments:         Anesthesia Quick Evaluation

## 2013-12-20 NOTE — H&P (Signed)
Lindsey Hensley is an 80 y.o. female.   Chief Complaint: The irreducible MCP dislocation left thumb HPI: Patient presents with an irreducible dislocation left thumb she desires to proceed with surgical algorithm of care. She denies other complaints at present time.  Her chart has been reviewed including the recent hospital stay.  I reviewed all issues with she and her family at length and plan to proceed with surgical correction. Past Medical History  Diagnosis Date  . GERD (gastroesophageal reflux disease)   . Arthritis     hands  . Dislocation of left thumb   . Unsteady gait   . Stroke     slight left sided weakness    Past Surgical History  Procedure Laterality Date  . Cholecystectomy    . Ectopic pregnancy surgery    . Hip arthroplasty Left 05/24/2013    Procedure: ARTHROPLASTY UNIPOLAR HIP;  Surgeon: Eulas Post, MD;  Location: [facility] OR;  Service: Orthopedics;  Laterality: Left;  . Colon resection N/A 11/27/2013    Procedure: DIAGNOSTIC LAPAROSCOPY OPEN RESECTION OF SMALL BOWEL;  Surgeon: Kandis Cocking, MD;  Location: [facility] ORS;  Service: General;  Laterality: N/A;  . Colon surgery      History reviewed. No pertinent family history. Social History:  reports that she has quit smoking. She has never used smokeless tobacco. She reports that she does not drink alcohol or use illicit drugs.  Allergies:  Allergies  Allergen Reactions  . Sulfa Antibiotics Other (See Comments)    unknown    Medications Prior to Admission  Medication Sig Dispense Refill  . acetaminophen (TYLENOL) 325 MG tablet Take 2 tablets (650 mg total) by mouth every 6 (six) hours as needed for mild pain (or Fever >/= 101).      Marland Kitchen aspirin 325 MG tablet Take 1 tablet (325 mg total) by mouth daily.      . feeding supplement, ENSURE COMPLETE, (ENSURE COMPLETE) LIQD Take 237 mLs by mouth 2 (two) times daily between meals.  60 Bottle  1  . Multiple Vitamin (MULTIVITAMIN WITH MINERALS) TABS tablet Take 1 tablet by  mouth daily.      . pantoprazole (PROTONIX) 40 MG tablet Take 1 tablet (40 mg total) by mouth daily.  30 tablet  6  . Polyethyl Glycol-Propyl Glycol (SYSTANE) 0.4-0.3 % SOLN Place 2 drops into both eyes daily as needed (dry eyes).      . vitamin C (ASCORBIC ACID) 500 MG tablet Take 500 mg by mouth daily.      . ondansetron (ZOFRAN) 4 MG/2ML SOLN injection Inject 2 mLs (4 mg total) into the vein every 6 (six) hours as needed for nausea or vomiting.  2 mL  0  . promethazine (PHENERGAN) 12.5 MG tablet Take 1 tablet (12.5 mg total) by mouth 3 (three) times daily before meals.  90 tablet  1    No results found for this or any previous visit (from the past 48 hour(s)). No results found.  Review of Systems  Eyes: Negative.   Cardiovascular: Negative.   Neurological: Negative.   Psychiatric/Behavioral: Negative.     Blood pressure 150/74, pulse 89, temperature 98.7 F (37.1 C), temperature source Oral, resp. rate 18, height 5\' 6"  (1.676 m), weight 53.524 kg (118 lb), SpO2 97.00%. Physical Exam  Irreducible left thumb MCP dislocation  She is vascularly intact.  Chest is clear  Abdomen is nontender with recent surgical intervention. Patient notes no lower extremity pain. She is somewhat of a difficult historian  of course due to her medical problems. Assessment/Plan Left thumb irreducible MCP dislocation  Plan for surgical reduction with open procedure.  I've counseled the patient at length. Will proceed accordingly as she and her son who is power of attorney desired.  Patient Active Problem List   Diagnosis Date Noted  . Nausea and vomiting 12/17/2013  . Small bowel perforation 12/17/2013  . Rectal bleeding 12/09/2013  . SBO (small bowel obstruction) s/p SB resection 11/25/2013  . Dementia in Alzheimer's disease with delirium 11/25/2013  . Acute on chronic renal failure 11/25/2013  . Hemiparesis affecting left side as late effect of cerebrovascular accident 11/25/2013  .  Leukocytosis 11/24/2013  . Hip fracture, left 05/23/2013  . History of CVA (cerebrovascular accident) 05/23/2013    Aron BabaGRAMIG III,[name] M 12/20/2013, 4:47 PM

## 2013-12-21 NOTE — Progress Notes (Signed)
Patient is medically stable for D/C to [facility]. [facility] patient is going back to room 505 A. Clinical Child psychotherapistocial Worker (CSW) prepared D/C packet and arranged non-emergency EMS ([facility]) for transport. CSW sent Janie D/C summary in carefinder. RN will call report. CSW updated FL2 and got MD to sign it. CSW made patient's son [name] aware of above. Don took the wheelchair that was in patient's room back to [facility] because it belonged to the facility. Nursing is aware of above. Please reconsult if further social work needs arise. CSW signing off.   Jetta LoutBailey Morgan, LCSWA Weekend CSW [phone]

## 2013-12-21 NOTE — Progress Notes (Signed)
Subjective: 1 Day Post-Op Procedure(s) (LRB): Open Reduction MCP Dislocation (Left) Patient reports pain as mild.    Objective: Vital signs in last 24 hours: Temp:  [98.1 F (36.7 C)-98.9 F (37.2 C)] 98.8 F (37.1 C) (03/14 0532) Pulse Rate:  [78-98] 89 (03/14 0532) Resp:  [14-18] 18 (03/14 0532) BP: (121-150)/(55-89) 121/57 mmHg (03/14 0532) SpO2:  [90 %-99 %] 98 % (03/14 0532) Weight:  [118 lb (53.524 kg)] 118 lb (53.524 kg) (03/13 1245)  Intake/Output from previous day: 03/13 0701 - 03/14 0700 In: 1775 [P.O.:480; I.V.:1295] Out: -  Intake/Output this shift:    No results found for this basename: HGB,  in the last 72 hours No results found for this basename: WBC, RBC, HCT, PLT,  in the last 72 hours No results found for this basename: NA, K, CL, CO2, BUN, CREATININE, GLUCOSE, CALCIUM,  in the last 72 hours No results found for this basename: LABPT, INR,  in the last 72 hours  Splint intact; wiggles thumb; cap refill < 3 seconds; sensation intact  Assessment/Plan: 1 Day Post-Op Procedure(s) (LRB): Open Reduction MCP Dislocation (Left) Advance diet D/C IV fluids Discharge plan per Dr. Cherylann BanasGramig  Melanie Pellot V 12/21/2013, 7:40 AM

## 2013-12-21 NOTE — Progress Notes (Signed)
Utilization review completed.  P.J. Jamani Bearce,RN,BSN Case Manager [phone]  

## 2013-12-21 NOTE — Op Note (Signed)
NAMGarnet Koyanagi:  Kaffenberger, Jovana                 ACCOUNT NO.:  [id number]  MEDICAL RECORD NO.:  [id number]  LOCATION:  5N08C                        FACILITY:  [facility]  PHYSICIAN:  Dionne AnoWilliam M. Roben Schliep, M.D.DATE OF BIRTH:  1922/08/28  DATE OF PROCEDURE: DATE OF DISCHARGE:                              OPERATIVE REPORT   PREOPERATIVE DIAGNOSIS:  Irreducible fracture dislocation, left thumb metacarpophalangeal joint.  POSTOPERATIVE DIAGNOSIS:  Irreducible fracture dislocation, left thumb metacarpophalangeal joint.  PROCEDURES: 1. Open reduction, irreducible metacarpophalangeal joint, left thumb. 2. Closed treatment of metacarpal fracture, left thumb. 3. Volar plate release, left thumb.  SURGEON:  Dionne AnoWilliam M. Amanda PeaGramig, M.D.  ASSISTANT:  None.  COMPLICATIONS:  None.  ANESTHESIA:  General.  TOURNIQUET TIME:  Less than 30 minutes.  INDICATIONS:  This is an 80 year old female who presents with the above- mentioned diagnosis.  I have counseled her in regards to risks and benefits of surgery, including risk of infection, bleeding, anesthesia, damage to normal structures, and failure of surgery to accomplish its intended goals of relieving symptoms and restoring function.  She was just discharged from the hospital, [facility] and [facility], sustained the injury.  She presented to my office 2-3 days out from her injury, and we thus booked her for surgery as noted above.  She has an irreducible fractures dislocation with interposition of the volar plate.  I have discussed with her son, who is power of attorney, all issues, do's and don'ts, plans, risks and benefits, and timeframe duration of recovery and typical circumstances.  OPERATION IN DETAIL:  The patient was seen by myself and Anesthesia, taken to the operative suite, underwent smooth induction of general anesthesia.  Prepped and draped in usual sterile fashion about the left upper extremity.  Time-out was called.  Pre  and postop checklist complete.  Following this, under sterile field, tourniquet was insufflated.  A dorsal incision was made.  Dissection was carried down. Interval between the EPB and EPL was split open with sharp knife blade under 4.0 loupe magnification.  I then identified the fracture about the metacarpal head deep in location and the interposed volar plate.  At this time, I performed sharp knife blade resection of the volar plate with a split in its midline.  This allowed for reduction.  Following this, the volar plate was carefully repositioned and repaired with setting technique.  Thus, relocation via release of the interposed volar plate was accomplished without difficulty, wished to the patient tolerate that well.  The joint reduced nicely.  There was small fracture process about the metacarpal head; however, this was not unstable.  I thus performed a setting technique for the metacarpal fracture.  I irrigated it copiously, made sure the joint looked excellent, and was stable and then repaired the interval between the EPB and EPL with FiberWire.  The patient tolerated this well.  We then very carefully treated the wounds on the hand.  I performed the I and D of skin and subcu about the volar index finger.  This was due to the prior trauma.  Following this, I treated her with Adaptic Xeroform and gauze.  Prior to closure, I placed 8 mL  of Sensorcaine without epinephrine and wound for postop analgesia. I treated this skin as if it wear a skin graft given the fragility of her skin.  I then placed her an ice splint, dorsal blocking in nature. She tolerated this well.  There were no complicating features.  All sponge, needle and instrument counts were reported as correct.  She was taken to the recovery room in stable condition.  She will be admitted overnight for observation.  We will let her go home tomorrow pending stability.  It was a pleasure to see her today.  Do's and  don'ts discussed, and all questions have been encouraged and answered.     Dionne Ano. Amanda Pea, M.D.     [facility]  D:  12/20/2013  T:  12/21/2013  Job:  [id number]  cc:   Kari Baars, M.D.

## 2013-12-21 NOTE — Discharge Summary (Signed)
Physician Discharge Summary  Patient ID: Lindsey CarnesRuth S Hensley MRN: [id number] DOB/AGE: 1922-06-29 80 y.o.  Admit date: 12/20/2013 Discharge date: 12/21/2013  Admission Diagnoses: Left thumb dislocation at MCP joint   Discharge Diagnoses: Same-status post surgical relocation and repair Active Problems:   Fracture dislocation of thumb   Discharged Condition: fair  Hospital Course: Patient was admitted postoperatively for IV antibiotics after thumb surgery left upper extremity. She presented to my office with a left thumb dislocation. This was irreducible. We took her to surgery and performed a volar plate release and repair and relocation with fracture management. She tolerated this well.  Given her age and recent admission for her abdominal issues we performed postoperative observatory care.  She did quite well. She's ever tolerated regular diet. She's able to void. She is ambulatory with slow gait. She has no complications. Her postop course was quite uneventful  She was awake alert and oriented postop day 1 and ready for DC.  Consults: None  Significant Diagnostic Studies: labs: See chart  Treatments: surgery: See chart  Discharge Exam: Blood pressure 109/55, pulse 91, temperature 98.9 F (37.2 C), temperature source Oral, resp. rate 18, height 5\' 6"  (1.676 m), weight 53.524 kg (118 lb), SpO2 98.00%. General appearance: alert and cooperative Left thumb looks quite well no signs of infection. She can move the thumb beautifully and has no complications. .The patient is alert and oriented in no acute distress the patient complains of pain in the affected upper extremity.  The patient is noted to have a normal HEENT exam.  Lung fields show equal chest expansion and no shortness of breath  abdomen exam is nontender without distention.  Lower extremity examination does not show any fracture dislocation or blood clot symptoms.  Pelvis is stable neck and back are stable and nontender  the  abdomen has a dressing which is clean and dry. There is no signs of infection. I reviewed this at length.  Disposition: 03-Skilled Nursing Facility     Medication List    ASK your doctor about these medications       acetaminophen 325 MG tablet  Commonly known as:  TYLENOL  Take 2 tablets (650 mg total) by mouth every 6 (six) hours as needed for mild pain (or Fever >/= 101).     aspirin 325 MG tablet  Take 1 tablet (325 mg total) by mouth daily.     feeding supplement (ENSURE COMPLETE) Liqd  Take 237 mLs by mouth 2 (two) times daily between meals.     multivitamin with minerals Tabs tablet  Take 1 tablet by mouth daily.     ondansetron 4 MG/2ML Soln injection  Commonly known as:  ZOFRAN  Inject 2 mLs (4 mg total) into the vein every 6 (six) hours as needed for nausea or vomiting.     pantoprazole 40 MG tablet  Commonly known as:  PROTONIX  Take 1 tablet (40 mg total) by mouth daily.     promethazine 12.5 MG tablet  Commonly known as:  PHENERGAN  Take 1 tablet (12.5 mg total) by mouth 3 (three) times daily before meals.     SYSTANE 0.4-0.3 % Soln  Generic drug:  Polyethyl Glycol-Propyl Glycol  Place 2 drops into both eyes daily as needed (dry eyes).     vitamin C 500 MG tablet  Commonly known as:  ASCORBIC ACID  Take 500 mg by mouth daily.         SignedKaren Chafe: Darian Cansler III,Kaeden Mester M 12/21/2013, 4:05 PM

## 2013-12-21 NOTE — Discharge Instructions (Signed)
Please keep your bandage clean and dry.  Please do not remove your dressing.  It is okay to move your fingers and thumb tip within the dressing but do not remove it.  Please call for any problems.  Please resume all her regular medicines.  Please followup with your general surgeon and Dr. Clelia CroftShaw as scheduled

## 2013-12-24 ENCOUNTER — Encounter (HOSPITAL_COMMUNITY): Payer: Self-pay | Admitting: Orthopedic Surgery

## 2013-12-24 NOTE — Addendum Note (Signed)
Addendum created 12/24/13 1250 by Bedelia PersonLee Chareese Sergent, MD   Modules edited: Anesthesia Responsible Staff

## 2014-01-08 ENCOUNTER — Ambulatory Visit (INDEPENDENT_AMBULATORY_CARE_PROVIDER_SITE_OTHER): Payer: Medicare HMO | Admitting: Surgery

## 2014-01-08 ENCOUNTER — Encounter (INDEPENDENT_AMBULATORY_CARE_PROVIDER_SITE_OTHER): Payer: Self-pay | Admitting: Surgery

## 2014-01-08 VITALS — BP 128/86 | HR 64 | Temp 99.0°F | Resp 18 | Ht 65.0 in | Wt 124.0 lb

## 2014-01-08 DIAGNOSIS — K56609 Unspecified intestinal obstruction, unspecified as to partial versus complete obstruction: Secondary | ICD-10-CM

## 2014-01-08 NOTE — Progress Notes (Addendum)
[facility]  Ovidio Kinavid Arcadio Cope, MD,  FACS [address]  [address] [place], [place]    [id number] Phone:  [phone] FAX:  [phone]   Re:   Lindsey Hensley DOB:   December 29, 1921 MRN:   [id number]  ASSESSMENT AND PLAN: 1.  Abdominal laparoscopy converted to open laparotomy with small bowel resection - 11/27/2013 - D. Malak Orantes  For closed loop small bowel obstruction - [facility] from 11/24/2013 to 12/17/2013  [facility]om[facility] for unclear reason.  The patient is not dehydrated.  She cannot give a history.  The history is from her son.  He said that they did an x-ray of her abdomen on Monday, 01/06/2014, which was negative.  I would give her time right now.  I do not see this related to her recent surgery.  2. Dementia   Short term memory loss   The patient cannot tell me much of her problems.  3. History of stroke   History of right frontal stroke with left-sided hemiparesis resulting in gait instability dating back to 2012  4. History of left hip fx - 05/23/2013   J. Landau  5. HTN  6. Severe scoliosis 7.  Left thumb fx/dislocation - 12/20/2013 - W. Gramig  HISTORY OF PRESENT ILLNESS: Chief Complaint  Patient presents with  . Routine Post Op    sx 11-27-13 sm bowel resect.    Lindsey Hensley is a 80 y.o. (DOB: December 29, 1921)  white  female who is a patient of Martha ClanShaw, William, MD (while she is at [facility] she is under [facility] care) and comes to me today for follow up of small bowel obstruction. Her son, [name], is with her. The patient is at [facility]. She can give not history.  Her son said that she is vomiting with every meal.  Her bowels are working normally.  She is having no pain.  She is having no fever.  Past Medical History  Diagnosis Date  . GERD (gastroesophageal reflux disease)   . Arthritis     hands  . Dislocation of left thumb   . Unsteady gait   . Stroke     slight left sided weakness   SOCIAL HISTORY: Son, [name], with  patient.  Prior to her illness, she was living independently.  But her son was spending every night with her.  She clearly is going to need supervised care. The patient is at [facility].  [facility].  PHYSICAL EXAM: BP 128/86  Pulse 64  Temp(Src) 99 F (37.2 C)  Resp 18  Ht 5\' 5"  (1.651 m)  Wt 124 lb (56.246 kg)  BMI 20.63 kg/m2  General: Older WF who is alert, but cannot answer any questions about her history. HEENT: Normal. Pupils equal.  Neck: Supple. No mass.  No thyroid mass.   Lungs: Clear to auscultation and symmetric breath sounds. Heart:  RRR. No murmur or rub. Abdomen: Soft. No mass. No tenderness. No hernia. Normal bowel sounds.  Lower midline incision is well healed.  I did burn on small area of granulation tissue with silver nitrate. Extremities:  Left wrist in splint. Neurologic:  Grossly intact to motor and sensory function.  She needed two people to help her get on the exam table. Psychiatric: She can not remember things 10 minutes later.  DATA REVIEWED: Epic notes  Ovidio Kinavid Emillio Ngo, MD,  [facility] [address]  [phone]  [place], [place]    [place] Phone:  [phone] FAX:  [phone]

## 2014-10-10 DIAGNOSIS — I69353 Hemiplegia and hemiparesis following cerebral infarction affecting right non-dominant side: Secondary | ICD-10-CM | POA: Diagnosis not present

## 2014-10-10 DIAGNOSIS — I10 Essential (primary) hypertension: Secondary | ICD-10-CM | POA: Diagnosis not present

## 2014-10-10 DIAGNOSIS — M810 Age-related osteoporosis without current pathological fracture: Secondary | ICD-10-CM | POA: Diagnosis not present

## 2014-10-10 DIAGNOSIS — F0280 Dementia in other diseases classified elsewhere without behavioral disturbance: Secondary | ICD-10-CM | POA: Diagnosis not present

## 2014-10-10 DIAGNOSIS — R1310 Dysphagia, unspecified: Secondary | ICD-10-CM | POA: Diagnosis not present

## 2014-10-10 DIAGNOSIS — G309 Alzheimer's disease, unspecified: Secondary | ICD-10-CM | POA: Diagnosis not present

## 2014-10-13 DIAGNOSIS — I10 Essential (primary) hypertension: Secondary | ICD-10-CM | POA: Diagnosis not present

## 2014-10-13 DIAGNOSIS — M810 Age-related osteoporosis without current pathological fracture: Secondary | ICD-10-CM | POA: Diagnosis not present

## 2014-10-13 DIAGNOSIS — F0280 Dementia in other diseases classified elsewhere without behavioral disturbance: Secondary | ICD-10-CM | POA: Diagnosis not present

## 2014-10-13 DIAGNOSIS — G309 Alzheimer's disease, unspecified: Secondary | ICD-10-CM | POA: Diagnosis not present

## 2014-10-13 DIAGNOSIS — I69353 Hemiplegia and hemiparesis following cerebral infarction affecting right non-dominant side: Secondary | ICD-10-CM | POA: Diagnosis not present

## 2014-10-13 DIAGNOSIS — R1310 Dysphagia, unspecified: Secondary | ICD-10-CM | POA: Diagnosis not present

## 2014-10-15 DIAGNOSIS — F0280 Dementia in other diseases classified elsewhere without behavioral disturbance: Secondary | ICD-10-CM | POA: Diagnosis not present

## 2014-10-15 DIAGNOSIS — R1310 Dysphagia, unspecified: Secondary | ICD-10-CM | POA: Diagnosis not present

## 2014-10-15 DIAGNOSIS — I10 Essential (primary) hypertension: Secondary | ICD-10-CM | POA: Diagnosis not present

## 2014-10-15 DIAGNOSIS — I69353 Hemiplegia and hemiparesis following cerebral infarction affecting right non-dominant side: Secondary | ICD-10-CM | POA: Diagnosis not present

## 2014-10-15 DIAGNOSIS — G309 Alzheimer's disease, unspecified: Secondary | ICD-10-CM | POA: Diagnosis not present

## 2014-10-15 DIAGNOSIS — M810 Age-related osteoporosis without current pathological fracture: Secondary | ICD-10-CM | POA: Diagnosis not present

## 2014-10-17 DIAGNOSIS — E559 Vitamin D deficiency, unspecified: Secondary | ICD-10-CM | POA: Diagnosis not present

## 2014-10-17 DIAGNOSIS — I639 Cerebral infarction, unspecified: Secondary | ICD-10-CM | POA: Diagnosis not present

## 2014-10-17 DIAGNOSIS — M810 Age-related osteoporosis without current pathological fracture: Secondary | ICD-10-CM | POA: Diagnosis not present

## 2014-10-17 DIAGNOSIS — K5901 Slow transit constipation: Secondary | ICD-10-CM | POA: Diagnosis not present

## 2014-10-17 DIAGNOSIS — K219 Gastro-esophageal reflux disease without esophagitis: Secondary | ICD-10-CM | POA: Diagnosis not present

## 2014-10-17 DIAGNOSIS — G309 Alzheimer's disease, unspecified: Secondary | ICD-10-CM | POA: Diagnosis not present

## 2014-10-17 DIAGNOSIS — R112 Nausea with vomiting, unspecified: Secondary | ICD-10-CM | POA: Diagnosis not present

## 2014-10-21 DIAGNOSIS — M810 Age-related osteoporosis without current pathological fracture: Secondary | ICD-10-CM | POA: Diagnosis not present

## 2014-10-21 DIAGNOSIS — F0280 Dementia in other diseases classified elsewhere without behavioral disturbance: Secondary | ICD-10-CM | POA: Diagnosis not present

## 2014-10-21 DIAGNOSIS — I10 Essential (primary) hypertension: Secondary | ICD-10-CM | POA: Diagnosis not present

## 2014-10-21 DIAGNOSIS — I69353 Hemiplegia and hemiparesis following cerebral infarction affecting right non-dominant side: Secondary | ICD-10-CM | POA: Diagnosis not present

## 2014-10-21 DIAGNOSIS — R1310 Dysphagia, unspecified: Secondary | ICD-10-CM | POA: Diagnosis not present

## 2014-10-21 DIAGNOSIS — G309 Alzheimer's disease, unspecified: Secondary | ICD-10-CM | POA: Diagnosis not present

## 2014-10-23 DIAGNOSIS — R1310 Dysphagia, unspecified: Secondary | ICD-10-CM | POA: Diagnosis not present

## 2014-10-23 DIAGNOSIS — G309 Alzheimer's disease, unspecified: Secondary | ICD-10-CM | POA: Diagnosis not present

## 2014-10-23 DIAGNOSIS — I69353 Hemiplegia and hemiparesis following cerebral infarction affecting right non-dominant side: Secondary | ICD-10-CM | POA: Diagnosis not present

## 2014-10-23 DIAGNOSIS — I10 Essential (primary) hypertension: Secondary | ICD-10-CM | POA: Diagnosis not present

## 2014-10-23 DIAGNOSIS — F0280 Dementia in other diseases classified elsewhere without behavioral disturbance: Secondary | ICD-10-CM | POA: Diagnosis not present

## 2014-10-23 DIAGNOSIS — M810 Age-related osteoporosis without current pathological fracture: Secondary | ICD-10-CM | POA: Diagnosis not present

## 2014-10-28 DIAGNOSIS — I10 Essential (primary) hypertension: Secondary | ICD-10-CM | POA: Diagnosis not present

## 2014-10-28 DIAGNOSIS — M810 Age-related osteoporosis without current pathological fracture: Secondary | ICD-10-CM | POA: Diagnosis not present

## 2014-10-28 DIAGNOSIS — R1310 Dysphagia, unspecified: Secondary | ICD-10-CM | POA: Diagnosis not present

## 2014-10-28 DIAGNOSIS — I69353 Hemiplegia and hemiparesis following cerebral infarction affecting right non-dominant side: Secondary | ICD-10-CM | POA: Diagnosis not present

## 2014-10-28 DIAGNOSIS — G309 Alzheimer's disease, unspecified: Secondary | ICD-10-CM | POA: Diagnosis not present

## 2014-10-28 DIAGNOSIS — F0280 Dementia in other diseases classified elsewhere without behavioral disturbance: Secondary | ICD-10-CM | POA: Diagnosis not present

## 2014-10-29 DIAGNOSIS — R1310 Dysphagia, unspecified: Secondary | ICD-10-CM | POA: Diagnosis not present

## 2014-10-29 DIAGNOSIS — G309 Alzheimer's disease, unspecified: Secondary | ICD-10-CM | POA: Diagnosis not present

## 2014-10-29 DIAGNOSIS — I69353 Hemiplegia and hemiparesis following cerebral infarction affecting right non-dominant side: Secondary | ICD-10-CM | POA: Diagnosis not present

## 2014-10-29 DIAGNOSIS — F0280 Dementia in other diseases classified elsewhere without behavioral disturbance: Secondary | ICD-10-CM | POA: Diagnosis not present

## 2014-10-29 DIAGNOSIS — I10 Essential (primary) hypertension: Secondary | ICD-10-CM | POA: Diagnosis not present

## 2014-10-29 DIAGNOSIS — M810 Age-related osteoporosis without current pathological fracture: Secondary | ICD-10-CM | POA: Diagnosis not present

## 2014-11-04 DIAGNOSIS — K5901 Slow transit constipation: Secondary | ICD-10-CM | POA: Diagnosis not present

## 2014-11-04 DIAGNOSIS — F0280 Dementia in other diseases classified elsewhere without behavioral disturbance: Secondary | ICD-10-CM | POA: Diagnosis not present

## 2014-11-04 DIAGNOSIS — I69353 Hemiplegia and hemiparesis following cerebral infarction affecting right non-dominant side: Secondary | ICD-10-CM | POA: Diagnosis not present

## 2014-11-04 DIAGNOSIS — D649 Anemia, unspecified: Secondary | ICD-10-CM | POA: Diagnosis not present

## 2014-11-04 DIAGNOSIS — R112 Nausea with vomiting, unspecified: Secondary | ICD-10-CM | POA: Diagnosis not present

## 2014-11-04 DIAGNOSIS — E559 Vitamin D deficiency, unspecified: Secondary | ICD-10-CM | POA: Diagnosis not present

## 2014-11-04 DIAGNOSIS — I639 Cerebral infarction, unspecified: Secondary | ICD-10-CM | POA: Diagnosis not present

## 2014-11-04 DIAGNOSIS — M810 Age-related osteoporosis without current pathological fracture: Secondary | ICD-10-CM | POA: Diagnosis not present

## 2014-11-04 DIAGNOSIS — K219 Gastro-esophageal reflux disease without esophagitis: Secondary | ICD-10-CM | POA: Diagnosis not present

## 2014-11-04 DIAGNOSIS — R1310 Dysphagia, unspecified: Secondary | ICD-10-CM | POA: Diagnosis not present

## 2014-11-04 DIAGNOSIS — I10 Essential (primary) hypertension: Secondary | ICD-10-CM | POA: Diagnosis not present

## 2014-11-04 DIAGNOSIS — G309 Alzheimer's disease, unspecified: Secondary | ICD-10-CM | POA: Diagnosis not present

## 2014-11-06 DIAGNOSIS — I69353 Hemiplegia and hemiparesis following cerebral infarction affecting right non-dominant side: Secondary | ICD-10-CM | POA: Diagnosis not present

## 2014-11-06 DIAGNOSIS — R1310 Dysphagia, unspecified: Secondary | ICD-10-CM | POA: Diagnosis not present

## 2014-11-06 DIAGNOSIS — F0280 Dementia in other diseases classified elsewhere without behavioral disturbance: Secondary | ICD-10-CM | POA: Diagnosis not present

## 2014-11-06 DIAGNOSIS — M810 Age-related osteoporosis without current pathological fracture: Secondary | ICD-10-CM | POA: Diagnosis not present

## 2014-11-06 DIAGNOSIS — I10 Essential (primary) hypertension: Secondary | ICD-10-CM | POA: Diagnosis not present

## 2014-11-06 DIAGNOSIS — G309 Alzheimer's disease, unspecified: Secondary | ICD-10-CM | POA: Diagnosis not present

## 2014-11-10 DIAGNOSIS — R1310 Dysphagia, unspecified: Secondary | ICD-10-CM | POA: Diagnosis not present

## 2014-11-10 DIAGNOSIS — G309 Alzheimer's disease, unspecified: Secondary | ICD-10-CM | POA: Diagnosis not present

## 2014-11-10 DIAGNOSIS — M810 Age-related osteoporosis without current pathological fracture: Secondary | ICD-10-CM | POA: Diagnosis not present

## 2014-11-10 DIAGNOSIS — F0280 Dementia in other diseases classified elsewhere without behavioral disturbance: Secondary | ICD-10-CM | POA: Diagnosis not present

## 2014-11-10 DIAGNOSIS — I69353 Hemiplegia and hemiparesis following cerebral infarction affecting right non-dominant side: Secondary | ICD-10-CM | POA: Diagnosis not present

## 2014-11-10 DIAGNOSIS — I10 Essential (primary) hypertension: Secondary | ICD-10-CM | POA: Diagnosis not present

## 2014-11-18 DIAGNOSIS — I10 Essential (primary) hypertension: Secondary | ICD-10-CM | POA: Diagnosis not present

## 2014-11-18 DIAGNOSIS — I69353 Hemiplegia and hemiparesis following cerebral infarction affecting right non-dominant side: Secondary | ICD-10-CM | POA: Diagnosis not present

## 2014-11-18 DIAGNOSIS — M810 Age-related osteoporosis without current pathological fracture: Secondary | ICD-10-CM | POA: Diagnosis not present

## 2014-11-18 DIAGNOSIS — F0280 Dementia in other diseases classified elsewhere without behavioral disturbance: Secondary | ICD-10-CM | POA: Diagnosis not present

## 2014-11-18 DIAGNOSIS — Z0000 Encounter for general adult medical examination without abnormal findings: Secondary | ICD-10-CM | POA: Diagnosis not present

## 2014-11-18 DIAGNOSIS — R1310 Dysphagia, unspecified: Secondary | ICD-10-CM | POA: Diagnosis not present

## 2014-11-18 DIAGNOSIS — G309 Alzheimer's disease, unspecified: Secondary | ICD-10-CM | POA: Diagnosis not present

## 2014-11-20 DIAGNOSIS — E782 Mixed hyperlipidemia: Secondary | ICD-10-CM | POA: Diagnosis not present

## 2014-11-20 DIAGNOSIS — I10 Essential (primary) hypertension: Secondary | ICD-10-CM | POA: Diagnosis not present

## 2014-11-20 DIAGNOSIS — E039 Hypothyroidism, unspecified: Secondary | ICD-10-CM | POA: Diagnosis not present

## 2014-11-26 DIAGNOSIS — F0280 Dementia in other diseases classified elsewhere without behavioral disturbance: Secondary | ICD-10-CM | POA: Diagnosis not present

## 2014-11-26 DIAGNOSIS — M810 Age-related osteoporosis without current pathological fracture: Secondary | ICD-10-CM | POA: Diagnosis not present

## 2014-11-26 DIAGNOSIS — I69353 Hemiplegia and hemiparesis following cerebral infarction affecting right non-dominant side: Secondary | ICD-10-CM | POA: Diagnosis not present

## 2014-11-26 DIAGNOSIS — G309 Alzheimer's disease, unspecified: Secondary | ICD-10-CM | POA: Diagnosis not present

## 2014-11-26 DIAGNOSIS — R1310 Dysphagia, unspecified: Secondary | ICD-10-CM | POA: Diagnosis not present

## 2014-11-26 DIAGNOSIS — I10 Essential (primary) hypertension: Secondary | ICD-10-CM | POA: Diagnosis not present

## 2014-12-02 DIAGNOSIS — R1310 Dysphagia, unspecified: Secondary | ICD-10-CM | POA: Diagnosis not present

## 2014-12-02 DIAGNOSIS — K5901 Slow transit constipation: Secondary | ICD-10-CM | POA: Diagnosis not present

## 2014-12-02 DIAGNOSIS — E559 Vitamin D deficiency, unspecified: Secondary | ICD-10-CM | POA: Diagnosis not present

## 2014-12-02 DIAGNOSIS — G309 Alzheimer's disease, unspecified: Secondary | ICD-10-CM | POA: Diagnosis not present

## 2014-12-02 DIAGNOSIS — M810 Age-related osteoporosis without current pathological fracture: Secondary | ICD-10-CM | POA: Diagnosis not present

## 2014-12-02 DIAGNOSIS — I69353 Hemiplegia and hemiparesis following cerebral infarction affecting right non-dominant side: Secondary | ICD-10-CM | POA: Diagnosis not present

## 2014-12-02 DIAGNOSIS — K219 Gastro-esophageal reflux disease without esophagitis: Secondary | ICD-10-CM | POA: Diagnosis not present

## 2014-12-02 DIAGNOSIS — R112 Nausea with vomiting, unspecified: Secondary | ICD-10-CM | POA: Diagnosis not present

## 2014-12-02 DIAGNOSIS — I639 Cerebral infarction, unspecified: Secondary | ICD-10-CM | POA: Diagnosis not present

## 2014-12-02 DIAGNOSIS — I10 Essential (primary) hypertension: Secondary | ICD-10-CM | POA: Diagnosis not present

## 2014-12-02 DIAGNOSIS — D649 Anemia, unspecified: Secondary | ICD-10-CM | POA: Diagnosis not present

## 2014-12-02 DIAGNOSIS — F0280 Dementia in other diseases classified elsewhere without behavioral disturbance: Secondary | ICD-10-CM | POA: Diagnosis not present

## 2014-12-09 DIAGNOSIS — F0150 Vascular dementia without behavioral disturbance: Secondary | ICD-10-CM | POA: Diagnosis not present

## 2014-12-30 DIAGNOSIS — G309 Alzheimer's disease, unspecified: Secondary | ICD-10-CM | POA: Diagnosis not present

## 2014-12-30 DIAGNOSIS — D649 Anemia, unspecified: Secondary | ICD-10-CM | POA: Diagnosis not present

## 2014-12-30 DIAGNOSIS — I639 Cerebral infarction, unspecified: Secondary | ICD-10-CM | POA: Diagnosis not present

## 2014-12-30 DIAGNOSIS — K5901 Slow transit constipation: Secondary | ICD-10-CM | POA: Diagnosis not present

## 2014-12-30 DIAGNOSIS — K219 Gastro-esophageal reflux disease without esophagitis: Secondary | ICD-10-CM | POA: Diagnosis not present

## 2014-12-30 DIAGNOSIS — R112 Nausea with vomiting, unspecified: Secondary | ICD-10-CM | POA: Diagnosis not present

## 2014-12-30 DIAGNOSIS — M810 Age-related osteoporosis without current pathological fracture: Secondary | ICD-10-CM | POA: Diagnosis not present

## 2014-12-30 DIAGNOSIS — E559 Vitamin D deficiency, unspecified: Secondary | ICD-10-CM | POA: Diagnosis not present

## 2015-01-27 DIAGNOSIS — E559 Vitamin D deficiency, unspecified: Secondary | ICD-10-CM | POA: Diagnosis not present

## 2015-01-27 DIAGNOSIS — D649 Anemia, unspecified: Secondary | ICD-10-CM | POA: Diagnosis not present

## 2015-01-27 DIAGNOSIS — G309 Alzheimer's disease, unspecified: Secondary | ICD-10-CM | POA: Diagnosis not present

## 2015-01-27 DIAGNOSIS — R040 Epistaxis: Secondary | ICD-10-CM | POA: Diagnosis not present

## 2015-01-27 DIAGNOSIS — M810 Age-related osteoporosis without current pathological fracture: Secondary | ICD-10-CM | POA: Diagnosis not present

## 2015-01-27 DIAGNOSIS — K5901 Slow transit constipation: Secondary | ICD-10-CM | POA: Diagnosis not present

## 2015-01-27 DIAGNOSIS — K219 Gastro-esophageal reflux disease without esophagitis: Secondary | ICD-10-CM | POA: Diagnosis not present

## 2015-01-27 DIAGNOSIS — I639 Cerebral infarction, unspecified: Secondary | ICD-10-CM | POA: Diagnosis not present

## 2015-01-27 DIAGNOSIS — R112 Nausea with vomiting, unspecified: Secondary | ICD-10-CM | POA: Diagnosis not present

## 2015-02-03 DIAGNOSIS — F0150 Vascular dementia without behavioral disturbance: Secondary | ICD-10-CM | POA: Diagnosis not present

## 2015-02-19 DIAGNOSIS — I10 Essential (primary) hypertension: Secondary | ICD-10-CM | POA: Diagnosis not present

## 2015-02-19 DIAGNOSIS — E782 Mixed hyperlipidemia: Secondary | ICD-10-CM | POA: Diagnosis not present

## 2015-02-19 DIAGNOSIS — E039 Hypothyroidism, unspecified: Secondary | ICD-10-CM | POA: Diagnosis not present

## 2015-02-24 DIAGNOSIS — R112 Nausea with vomiting, unspecified: Secondary | ICD-10-CM | POA: Diagnosis not present

## 2015-02-24 DIAGNOSIS — R040 Epistaxis: Secondary | ICD-10-CM | POA: Diagnosis not present

## 2015-02-24 DIAGNOSIS — M810 Age-related osteoporosis without current pathological fracture: Secondary | ICD-10-CM | POA: Diagnosis not present

## 2015-02-24 DIAGNOSIS — K5901 Slow transit constipation: Secondary | ICD-10-CM | POA: Diagnosis not present

## 2015-02-24 DIAGNOSIS — D649 Anemia, unspecified: Secondary | ICD-10-CM | POA: Diagnosis not present

## 2015-02-24 DIAGNOSIS — K219 Gastro-esophageal reflux disease without esophagitis: Secondary | ICD-10-CM | POA: Diagnosis not present

## 2015-02-24 DIAGNOSIS — E559 Vitamin D deficiency, unspecified: Secondary | ICD-10-CM | POA: Diagnosis not present

## 2015-02-24 DIAGNOSIS — I639 Cerebral infarction, unspecified: Secondary | ICD-10-CM | POA: Diagnosis not present

## 2015-02-24 DIAGNOSIS — G309 Alzheimer's disease, unspecified: Secondary | ICD-10-CM | POA: Diagnosis not present

## 2015-03-31 DIAGNOSIS — K219 Gastro-esophageal reflux disease without esophagitis: Secondary | ICD-10-CM | POA: Diagnosis not present

## 2015-03-31 DIAGNOSIS — K5901 Slow transit constipation: Secondary | ICD-10-CM | POA: Diagnosis not present

## 2015-03-31 DIAGNOSIS — R040 Epistaxis: Secondary | ICD-10-CM | POA: Diagnosis not present

## 2015-03-31 DIAGNOSIS — G309 Alzheimer's disease, unspecified: Secondary | ICD-10-CM | POA: Diagnosis not present

## 2015-03-31 DIAGNOSIS — R112 Nausea with vomiting, unspecified: Secondary | ICD-10-CM | POA: Diagnosis not present

## 2015-03-31 DIAGNOSIS — M810 Age-related osteoporosis without current pathological fracture: Secondary | ICD-10-CM | POA: Diagnosis not present

## 2015-03-31 DIAGNOSIS — D649 Anemia, unspecified: Secondary | ICD-10-CM | POA: Diagnosis not present

## 2015-03-31 DIAGNOSIS — E559 Vitamin D deficiency, unspecified: Secondary | ICD-10-CM | POA: Diagnosis not present

## 2015-03-31 DIAGNOSIS — I639 Cerebral infarction, unspecified: Secondary | ICD-10-CM | POA: Diagnosis not present

## 2015-04-07 DIAGNOSIS — F0150 Vascular dementia without behavioral disturbance: Secondary | ICD-10-CM | POA: Diagnosis not present

## 2015-04-20 DIAGNOSIS — M79672 Pain in left foot: Secondary | ICD-10-CM | POA: Diagnosis not present

## 2015-04-20 DIAGNOSIS — L603 Nail dystrophy: Secondary | ICD-10-CM | POA: Diagnosis not present

## 2015-04-20 DIAGNOSIS — Q845 Enlarged and hypertrophic nails: Secondary | ICD-10-CM | POA: Diagnosis not present

## 2015-04-20 DIAGNOSIS — B351 Tinea unguium: Secondary | ICD-10-CM | POA: Diagnosis not present

## 2015-04-20 DIAGNOSIS — M79671 Pain in right foot: Secondary | ICD-10-CM | POA: Diagnosis not present

## 2015-04-21 DIAGNOSIS — F0150 Vascular dementia without behavioral disturbance: Secondary | ICD-10-CM | POA: Diagnosis not present

## 2015-04-24 DIAGNOSIS — E559 Vitamin D deficiency, unspecified: Secondary | ICD-10-CM | POA: Diagnosis not present

## 2015-04-28 DIAGNOSIS — I639 Cerebral infarction, unspecified: Secondary | ICD-10-CM | POA: Diagnosis not present

## 2015-04-28 DIAGNOSIS — E559 Vitamin D deficiency, unspecified: Secondary | ICD-10-CM | POA: Diagnosis not present

## 2015-04-28 DIAGNOSIS — D649 Anemia, unspecified: Secondary | ICD-10-CM | POA: Diagnosis not present

## 2015-04-28 DIAGNOSIS — R040 Epistaxis: Secondary | ICD-10-CM | POA: Diagnosis not present

## 2015-04-28 DIAGNOSIS — R112 Nausea with vomiting, unspecified: Secondary | ICD-10-CM | POA: Diagnosis not present

## 2015-04-28 DIAGNOSIS — K219 Gastro-esophageal reflux disease without esophagitis: Secondary | ICD-10-CM | POA: Diagnosis not present

## 2015-04-28 DIAGNOSIS — M810 Age-related osteoporosis without current pathological fracture: Secondary | ICD-10-CM | POA: Diagnosis not present

## 2015-04-28 DIAGNOSIS — K5901 Slow transit constipation: Secondary | ICD-10-CM | POA: Diagnosis not present

## 2015-04-28 DIAGNOSIS — G309 Alzheimer's disease, unspecified: Secondary | ICD-10-CM | POA: Diagnosis not present

## 2015-05-05 DIAGNOSIS — M7981 Nontraumatic hematoma of soft tissue: Secondary | ICD-10-CM | POA: Diagnosis not present

## 2015-05-07 DIAGNOSIS — E559 Vitamin D deficiency, unspecified: Secondary | ICD-10-CM | POA: Diagnosis not present

## 2015-05-08 ENCOUNTER — Emergency Department (HOSPITAL_COMMUNITY): Payer: Commercial Managed Care - HMO

## 2015-05-08 ENCOUNTER — Emergency Department (HOSPITAL_COMMUNITY)
Admission: EM | Admit: 2015-05-08 | Discharge: 2015-05-08 | Disposition: A | Discharge status: Home / Self Care | Payer: Commercial Managed Care - HMO | Attending: Emergency Medicine | Admitting: Emergency Medicine

## 2015-05-08 ENCOUNTER — Encounter (HOSPITAL_COMMUNITY): Payer: Self-pay

## 2015-05-08 DIAGNOSIS — Z87828 Personal history of other (healed) physical injury and trauma: Secondary | ICD-10-CM | POA: Insufficient documentation

## 2015-05-08 DIAGNOSIS — Z8673 Personal history of transient ischemic attack (TIA), and cerebral infarction without residual deficits: Secondary | ICD-10-CM | POA: Insufficient documentation

## 2015-05-08 DIAGNOSIS — R55 Syncope and collapse: Secondary | ICD-10-CM | POA: Diagnosis not present

## 2015-05-08 DIAGNOSIS — Z7982 Long term (current) use of aspirin: Secondary | ICD-10-CM | POA: Diagnosis not present

## 2015-05-08 DIAGNOSIS — Z79899 Other long term (current) drug therapy: Secondary | ICD-10-CM | POA: Diagnosis not present

## 2015-05-08 DIAGNOSIS — F0390 Unspecified dementia without behavioral disturbance: Secondary | ICD-10-CM | POA: Diagnosis not present

## 2015-05-08 DIAGNOSIS — J449 Chronic obstructive pulmonary disease, unspecified: Secondary | ICD-10-CM | POA: Diagnosis not present

## 2015-05-08 DIAGNOSIS — R031 Nonspecific low blood-pressure reading: Secondary | ICD-10-CM | POA: Diagnosis not present

## 2015-05-08 DIAGNOSIS — M1990 Unspecified osteoarthritis, unspecified site: Secondary | ICD-10-CM | POA: Insufficient documentation

## 2015-05-08 DIAGNOSIS — K219 Gastro-esophageal reflux disease without esophagitis: Secondary | ICD-10-CM | POA: Insufficient documentation

## 2015-05-08 DIAGNOSIS — I959 Hypotension, unspecified: Secondary | ICD-10-CM | POA: Diagnosis present

## 2015-05-08 LAB — CBC
HCT: 32.9 % — ABNORMAL LOW (ref 36.0–46.0)
Hemoglobin: 10.5 g/dL — ABNORMAL LOW (ref 12.0–15.0)
MCH: 28.2 pg (ref 26.0–34.0)
MCHC: 31.9 g/dL (ref 30.0–36.0)
MCV: 88.4 fL (ref 78.0–100.0)
PLATELETS: 126 10*3/uL — AB (ref 150–400)
RBC: 3.72 MIL/uL — ABNORMAL LOW (ref 3.87–5.11)
RDW: 14.3 % (ref 11.5–15.5)
WBC: 7.4 10*3/uL (ref 4.0–10.5)

## 2015-05-08 LAB — BASIC METABOLIC PANEL
Anion gap: 8 (ref 5–15)
BUN: 15 mg/dL (ref 6–20)
CALCIUM: 9.2 mg/dL (ref 8.9–10.3)
CHLORIDE: 106 mmol/L (ref 101–111)
CO2: 29 mmol/L (ref 22–32)
CREATININE: 1.17 mg/dL — AB (ref 0.44–1.00)
GFR calc Af Amer: 46 mL/min — ABNORMAL LOW (ref >60)
GFR, EST NON AFRICAN AMERICAN: 40 mL/min — AB (ref >60)
GLUCOSE: 126 mg/dL — AB (ref 65–99)
Potassium: 3.6 mmol/L (ref 3.5–5.1)
SODIUM: 143 mmol/L (ref 135–145)

## 2015-05-08 LAB — TROPONIN I: Troponin I: <0.03 ng/mL (ref <0.031)

## 2015-05-08 LAB — CBG MONITORING, ED: Glucose-Capillary: 134 mg/dL — ABNORMAL HIGH (ref 65–99)

## 2015-05-08 MED ORDER — HYDROCODONE-ACETAMINOPHEN 5-325 MG PO TABS: 1.0000 | ORAL_TABLET | Freq: Four times a day (QID) | ORAL | Status: AC | PRN

## 2015-05-08 MED ORDER — METRONIDAZOLE 500 MG PO TABS: 500.0000 mg | ORAL_TABLET | Freq: Two times a day (BID) | ORAL | Status: DC

## 2015-05-08 NOTE — ED Provider Notes (Signed)
CSN: [id number]     Arrival date & time 05/08/15  1050 History   First MD Initiated Contact with Patient 05/08/15 1105     Chief Complaint  Patient presents with  . Hypotension     (Consider location/radiation/quality/duration/timing/severity/associated sxs/prior Treatment) HPI Comments: Pt. Is a 81 y/o F here from SNF with hx of CVA 2 years ago, but otherwise no significant PMH. Her son is the historian as the patient has severe dementia. He states that he went to visit her this am, and he says that she was in the bathroom when he got to her room. He closed the door and several minutes later she had not come out. She did not respond when he tried to talk to her through the door, so he opened the door and found her leaning to her left side in a semi conscious state with some vomit coming from her mouth. He says that she was clammy. He called the nurse, who called EMS. Initially, she was hypotensive to 70's/50's but her blood pressure normalized on the EMS ride over. She did not complain of chest pain, or SOB. She normally ambulates on her own at baseline, and her son says that he did not notice any motor deficits, and she did not complain of any weakness. Upon asking the patient, she denies chest pain, palpitations, vision changes, headaches, nausea, peripheral numbness, or feeling weak. Her son says that her mentation returned to baseline by the time that EMS arrived, and he feels that her mentation is at baseline currently. She is not on any blood pressure medications, or heart medications.   The history is provided by the patient.    Past Medical History  Diagnosis Date  . GERD (gastroesophageal reflux disease)   . Arthritis     hands  . Dislocation of left thumb   . Unsteady gait   . Stroke     slight left sided weakness   Past Surgical History  Procedure Laterality Date  . Cholecystectomy    . Ectopic pregnancy surgery    . Hip arthroplasty Left 05/24/2013    Procedure: ARTHROPLASTY  UNIPOLAR HIP;  Surgeon: Eulas Post, MD;  Location: [facility] OR;  Service: Orthopedics;  Laterality: Left;  . Colon resection N/A 11/27/2013    Procedure: DIAGNOSTIC LAPAROSCOPY OPEN RESECTION OF SMALL BOWEL;  Surgeon: Kandis Cocking, MD;  Location: [facility] ORS;  Service: General;  Laterality: N/A;  . Colon surgery    . Open reduction internal fixation (orif) metacarpal Left 12/20/2013    Procedure: Open Reduction MCP Dislocation;  Surgeon: Dominica Severin, MD;  Location: [facility] OR;  Service: Orthopedics;  Laterality: Left;   No family history on file. History  Substance Use Topics  . Smoking status: Former Games developer  . Smokeless tobacco: Never Used     Comment: " I quit along time ago "  . Alcohol Use: No   OB History    No data available     Review of Systems  Constitutional: Positive for diaphoresis. Negative for fever, activity change, appetite change and fatigue.  HENT: Negative.  Negative for congestion, hearing loss, tinnitus, trouble swallowing and voice change.   Eyes: Negative.  Negative for pain, redness and visual disturbance.  Respiratory: Negative.  Negative for cough, choking, chest tightness and shortness of breath.   Cardiovascular: Negative.  Negative for chest pain, palpitations and leg swelling.  Gastrointestinal: Negative.  Negative for nausea, vomiting, abdominal pain, diarrhea and constipation.  Endocrine: Negative.  Negative for  cold intolerance.  Genitourinary: Negative.  Negative for dysuria, urgency and frequency.  Musculoskeletal: Negative.  Negative for myalgias, back pain, neck pain and neck stiffness.  Skin: Negative.  Negative for pallor and rash.  Allergic/Immunologic: Negative.  Negative for environmental allergies.  Neurological: Negative.  Negative for dizziness, seizures, syncope, facial asymmetry, weakness, light-headedness, numbness and headaches.  Hematological: Negative.  Negative for adenopathy.  Psychiatric/Behavioral: Negative.  Negative for agitation.       Allergies  Sulfa antibiotics  Home Medications   Prior to Admission medications   Medication Sig Start Date End Date Taking? Authorizing Provider  acetaminophen (TYLENOL) 325 MG tablet Take 2 tablets (650 mg total) by mouth every 6 (six) hours as needed for mild pain (or Fever >/= 101). 12/17/13  Yes Martha Clan, MD  aspirin 325 MG tablet Take 1 tablet (325 mg total) by mouth daily. Patient taking differently: Take 325 mg by mouth daily as needed for mild pain.  05/29/13  Yes Ripudeep Jenna Luo, MD  feeding supplement, ENSURE COMPLETE, (ENSURE COMPLETE) LIQD Take 237 mLs by mouth 2 (two) times daily between meals. 12/17/13  Yes Martha Clan, MD  HYDROcodone-acetaminophen (NORCO) 5-325 MG per tablet Take 1-2 tablets by mouth every 6 (six) hours as needed for moderate pain. 05/08/15   Hillery Hunter Cole Eastridge, MD  LORazepam (ATIVAN) 0.5 MG tablet Take 0.25 mg by mouth every 8 (eight) hours as needed for anxiety.   Yes Historical Provider, MD  metroNIDAZOLE (FLAGYL) 500 MG tablet Take 1 tablet (500 mg total) by mouth 2 (two) times daily. 05/08/15   Yolande Jolly, MD  Multiple Vitamin (MULTIVITAMIN WITH MINERALS) TABS tablet Take 1 tablet by mouth daily.   Yes Historical Provider, MD  omeprazole (PRILOSEC) 20 MG capsule Take 20 mg by mouth daily.   Yes Historical Provider, MD  ondansetron (ZOFRAN) 4 MG/2ML SOLN injection Inject 2 mLs (4 mg total) into the vein every 6 (six) hours as needed for nausea or vomiting. Patient not taking: Reported on 05/08/2015 12/17/13   Martha Clan, MD  pantoprazole (PROTONIX) 40 MG tablet Take 1 tablet (40 mg total) by mouth daily. Patient not taking: Reported on 05/08/2015 12/17/13   Martha Clan, MD  Polyethyl Glycol-Propyl Glycol (SYSTANE) 0.4-0.3 % SOLN Place 2 drops into both eyes daily as needed (dry eyes).   Yes Historical Provider, MD  polyethylene glycol (MIRALAX / GLYCOLAX) packet Take 17 g by mouth daily.   Yes Historical Provider, MD  promethazine (PHENERGAN)  12.5 MG tablet Take 1 tablet (12.5 mg total) by mouth 3 (three) times daily before meals. Patient not taking: Reported on 05/08/2015 12/17/13   Martha Clan, MD  sodium chloride (OCEAN) 0.65 % SOLN nasal spray Place 1 spray into both nostrils 2 (two) times daily.   Yes Historical Provider, MD  Vitamin D, Ergocalciferol, (DRISDOL) 50000 UNITS CAPS capsule Take 50,000 Units by mouth every 7 (seven) days. Fridays   Yes Historical Provider, MD   BP 123/98 mmHg  Pulse 73  Temp(Src) 97.5 F (36.4 C) (Oral)  Resp 20  SpO2 98% Physical Exam  Constitutional: She appears well-developed and well-nourished. No distress.  HENT:  Head: Normocephalic and atraumatic.  Right Ear: External ear normal.  Left Ear: External ear normal.  Mouth/Throat: Oropharynx is clear and moist. No oropharyngeal exudate.  Eyes: Conjunctivae and EOM are normal. Pupils are equal, round, and reactive to light.  Neck: Normal range of motion. Neck supple. No JVD present. No tracheal deviation present. No thyromegaly present.  Cardiovascular: Normal rate, regular rhythm, S1 normal, S2 normal, normal heart sounds, intact distal pulses and normal pulses.  Exam reveals no gallop, no friction rub and no decreased pulses.   No murmur heard. Pulmonary/Chest: Effort normal. No stridor. No tachypnea. No respiratory distress. She has no decreased breath sounds. She has no wheezes. She has no rhonchi. She has no rales. She exhibits no tenderness.  Abdominal: Soft. Bowel sounds are normal. She exhibits no distension and no mass. There is no tenderness. There is no rebound and no guarding.  Musculoskeletal: Normal range of motion. She exhibits no edema or tenderness.  Lymphadenopathy:    She has no cervical adenopathy.  Neurological: She is alert. She is disoriented. She displays no atrophy, no tremor and normal reflexes. No cranial nerve deficit. She exhibits normal muscle tone. She displays no seizure activity. Coordination normal. GCS eye  subscore is 4. GCS verbal subscore is 5. GCS motor subscore is 6.  Not oriented to place, time, is able to state her name. This is baseline for her per son. She does respond to questions and commands. No focal deficits to exam other than chronic cognitive deficit.   Skin: Skin is warm and dry. No rash noted. She is not diaphoretic. No erythema.  Psychiatric: She has a normal mood and affect. Her behavior is normal.    ED Course  Procedures (including critical care time) Labs Review Labs Reviewed  BASIC METABOLIC PANEL - Abnormal; Notable for the following:    Glucose, Bld 126 (*)    Creatinine, Ser 1.17 (*)    GFR calc non Af Amer 40 (*)    GFR calc Af Amer 46 (*)    All other components within normal limits  CBC - Abnormal; Notable for the following:    RBC 3.72 (*)    Hemoglobin 10.5 (*)    HCT 32.9 (*)    Platelets 126 (*)    All other components within normal limits  CBG MONITORING, ED - Abnormal; Notable for the following:    Glucose-Capillary 134 (*)    All other components within normal limits  I-STAT TROPOININ, ED    Imaging Review Dg Chest 2 View  05/08/2015   CLINICAL DATA:  81 year old female with clinical concern for aspiration  EXAM: CHEST  2 VIEW  COMPARISON:  Prior chest x-ray 11/26/2013  FINDINGS: The patient is rotated toward the right which slightly distorts the cardiac and mediastinal contours. Within these limitations, similar degree of mild cardiomegaly. Atherosclerotic and tortuous thoracic aorta. Stable mild interstitial prominence and central bronchitic change. No focal airspace consolidation, pleural effusion, pulmonary edema or pneumothorax. The lungs remain hyperinflated. The bones are diffusely osteopenic. No acute osseous abnormality noted.  IMPRESSION: 1. No active cardiopulmonary disease. 2. Pulmonary hyperinflation and mild diffuse bronchitic change suggests underlying COPD. 3. Aortic atherosclerosis.   Electronically Signed   By: Malachy Moan M.D.    On: 05/08/2015 12:45     EKG Interpretation None      MDM   Final diagnoses:  None    Pt. Is a 81 y/o F here after episode of nausea, vomiting and near syncope while sitting on toilet at SNF. She returned to baseline shortly thereafter. She does not report any chest pain, or typical ACS symptoms or neurological symptoms. She is at her baseline mental status per her son. Workup here is negative at this time. High likelihood of this being a vasovagal event, though cannot completely rule out cardiac arrhythmia. Discussed the option  of admission for observation vs. Discharge home with son and their preference is to discharge home at this time. Safe for discharge with close follow up with PCP.    Yolande Jolly, MD 05/08/15 1610  Rolland Porter, MD 05/09/15 [phone]

## 2015-05-08 NOTE — ED Provider Notes (Signed)
Patient seen and evaluated. Care discussed with Dr. Jaquita Rector. Patient's son describes his mother having an episode where she became lightheaded and nearly passed out on the toilet after an episode of nausea and vomiting. No fall or injury. She's been asymptomatic since being laid supine. His troponin is normal having she would be appropriate for discharge home. We discussed care and treatment at length with son. He is adamant that she is DO NOT RESUSCITATE and would not want additional or invasive procedures performed.  Symptoms consistent with vagal reaction.  Rolland Porter, MD 05/08/15 [phone]

## 2015-05-08 NOTE — ED Notes (Signed)
IV removed, discharge vitals completed 

## 2015-05-08 NOTE — Discharge Instructions (Signed)
Your episode today was from a Vagal reaction.  This is the most common reason for passing out. Nausea, or discomfort usually trigger these episodes, and they are usually ISOLATED single episodes. Please return here with any recurrence.

## 2015-05-08 NOTE — ED Notes (Signed)
MD at bedside. 

## 2015-05-08 NOTE — ED Notes (Signed)
Pt off unit with xray 

## 2015-05-08 NOTE — ED Notes (Signed)
Pt. Is from [facility]. Pt. Had syncopal episode in bathroom. Son found pt. Sitting on toilet, fully dressed and vomiting. Pt. With advanced dementia, is at baseline, oriented to self only. Pt. Found to be hypotensive at 70/40, BP now 108/58. Pt. Given 100 ml NS in route.

## 2015-05-11 ENCOUNTER — Telehealth (HOSPITAL_COMMUNITY): Payer: Self-pay

## 2015-05-12 DIAGNOSIS — R1110 Vomiting, unspecified: Secondary | ICD-10-CM | POA: Diagnosis not present

## 2015-05-26 DIAGNOSIS — K5901 Slow transit constipation: Secondary | ICD-10-CM | POA: Diagnosis not present

## 2015-05-26 DIAGNOSIS — E559 Vitamin D deficiency, unspecified: Secondary | ICD-10-CM | POA: Diagnosis not present

## 2015-05-26 DIAGNOSIS — R040 Epistaxis: Secondary | ICD-10-CM | POA: Diagnosis not present

## 2015-05-26 DIAGNOSIS — I639 Cerebral infarction, unspecified: Secondary | ICD-10-CM | POA: Diagnosis not present

## 2015-05-26 DIAGNOSIS — K219 Gastro-esophageal reflux disease without esophagitis: Secondary | ICD-10-CM | POA: Diagnosis not present

## 2015-05-26 DIAGNOSIS — D649 Anemia, unspecified: Secondary | ICD-10-CM | POA: Diagnosis not present

## 2015-05-26 DIAGNOSIS — R112 Nausea with vomiting, unspecified: Secondary | ICD-10-CM | POA: Diagnosis not present

## 2015-05-26 DIAGNOSIS — H1031 Unspecified acute conjunctivitis, right eye: Secondary | ICD-10-CM | POA: Diagnosis not present

## 2015-05-26 DIAGNOSIS — M810 Age-related osteoporosis without current pathological fracture: Secondary | ICD-10-CM | POA: Diagnosis not present

## 2015-05-28 DIAGNOSIS — E782 Mixed hyperlipidemia: Secondary | ICD-10-CM | POA: Diagnosis not present

## 2015-05-28 DIAGNOSIS — I10 Essential (primary) hypertension: Secondary | ICD-10-CM | POA: Diagnosis not present

## 2015-05-28 DIAGNOSIS — E039 Hypothyroidism, unspecified: Secondary | ICD-10-CM | POA: Diagnosis not present

## 2015-06-05 DIAGNOSIS — R21 Rash and other nonspecific skin eruption: Secondary | ICD-10-CM | POA: Diagnosis not present

## 2015-06-09 DIAGNOSIS — F0150 Vascular dementia without behavioral disturbance: Secondary | ICD-10-CM | POA: Diagnosis not present

## 2015-06-23 DIAGNOSIS — E559 Vitamin D deficiency, unspecified: Secondary | ICD-10-CM | POA: Diagnosis not present

## 2015-06-23 DIAGNOSIS — I639 Cerebral infarction, unspecified: Secondary | ICD-10-CM | POA: Diagnosis not present

## 2015-06-23 DIAGNOSIS — R040 Epistaxis: Secondary | ICD-10-CM | POA: Diagnosis not present

## 2015-06-23 DIAGNOSIS — M810 Age-related osteoporosis without current pathological fracture: Secondary | ICD-10-CM | POA: Diagnosis not present

## 2015-06-23 DIAGNOSIS — R112 Nausea with vomiting, unspecified: Secondary | ICD-10-CM | POA: Diagnosis not present

## 2015-06-23 DIAGNOSIS — K219 Gastro-esophageal reflux disease without esophagitis: Secondary | ICD-10-CM | POA: Diagnosis not present

## 2015-06-23 DIAGNOSIS — K5901 Slow transit constipation: Secondary | ICD-10-CM | POA: Diagnosis not present

## 2015-06-23 DIAGNOSIS — D649 Anemia, unspecified: Secondary | ICD-10-CM | POA: Diagnosis not present

## 2015-06-23 DIAGNOSIS — H1031 Unspecified acute conjunctivitis, right eye: Secondary | ICD-10-CM | POA: Diagnosis not present

## 2015-07-03 ENCOUNTER — Emergency Department (HOSPITAL_COMMUNITY): Payer: Commercial Managed Care - HMO

## 2015-07-03 ENCOUNTER — Inpatient Hospital Stay (HOSPITAL_COMMUNITY)
Admission: EM | Admit: 2015-07-03 | Discharge: 2015-07-08 | DRG: 481 | Disposition: A | Discharge status: Skilled Nursing Facility | Payer: Commercial Managed Care - HMO | Attending: Internal Medicine | Admitting: Internal Medicine

## 2015-07-03 ENCOUNTER — Encounter (HOSPITAL_COMMUNITY): Payer: Self-pay | Admitting: Emergency Medicine

## 2015-07-03 DIAGNOSIS — Z87891 Personal history of nicotine dependence: Secondary | ICD-10-CM | POA: Diagnosis not present

## 2015-07-03 DIAGNOSIS — D696 Thrombocytopenia, unspecified: Secondary | ICD-10-CM | POA: Diagnosis present

## 2015-07-03 DIAGNOSIS — Y92121 Bathroom in nursing home as the place of occurrence of the external cause: Secondary | ICD-10-CM | POA: Diagnosis not present

## 2015-07-03 DIAGNOSIS — I10 Essential (primary) hypertension: Secondary | ICD-10-CM | POA: Diagnosis not present

## 2015-07-03 DIAGNOSIS — Z66 Do not resuscitate: Secondary | ICD-10-CM | POA: Diagnosis present

## 2015-07-03 DIAGNOSIS — L8990 Pressure ulcer of unspecified site, unspecified stage: Secondary | ICD-10-CM | POA: Insufficient documentation

## 2015-07-03 DIAGNOSIS — R509 Fever, unspecified: Secondary | ICD-10-CM | POA: Diagnosis not present

## 2015-07-03 DIAGNOSIS — M84359A Stress fracture, hip, unspecified, initial encounter for fracture: Secondary | ICD-10-CM | POA: Diagnosis not present

## 2015-07-03 DIAGNOSIS — R488 Other symbolic dysfunctions: Secondary | ICD-10-CM | POA: Diagnosis not present

## 2015-07-03 DIAGNOSIS — Z8673 Personal history of transient ischemic attack (TIA), and cerebral infarction without residual deficits: Secondary | ICD-10-CM

## 2015-07-03 DIAGNOSIS — D509 Iron deficiency anemia, unspecified: Secondary | ICD-10-CM | POA: Diagnosis present

## 2015-07-03 DIAGNOSIS — R112 Nausea with vomiting, unspecified: Secondary | ICD-10-CM | POA: Diagnosis present

## 2015-07-03 DIAGNOSIS — E876 Hypokalemia: Secondary | ICD-10-CM | POA: Diagnosis present

## 2015-07-03 DIAGNOSIS — D72819 Decreased white blood cell count, unspecified: Secondary | ICD-10-CM | POA: Diagnosis not present

## 2015-07-03 DIAGNOSIS — R109 Unspecified abdominal pain: Secondary | ICD-10-CM | POA: Diagnosis not present

## 2015-07-03 DIAGNOSIS — S7224XS Nondisplaced subtrochanteric fracture of right femur, sequela: Secondary | ICD-10-CM | POA: Diagnosis not present

## 2015-07-03 DIAGNOSIS — S72009A Fracture of unspecified part of neck of unspecified femur, initial encounter for closed fracture: Secondary | ICD-10-CM | POA: Diagnosis present

## 2015-07-03 DIAGNOSIS — F05 Delirium due to known physiological condition: Secondary | ICD-10-CM

## 2015-07-03 DIAGNOSIS — Z4789 Encounter for other orthopedic aftercare: Secondary | ICD-10-CM | POA: Diagnosis not present

## 2015-07-03 DIAGNOSIS — M549 Dorsalgia, unspecified: Secondary | ICD-10-CM | POA: Diagnosis not present

## 2015-07-03 DIAGNOSIS — I129 Hypertensive chronic kidney disease with stage 1 through stage 4 chronic kidney disease, or unspecified chronic kidney disease: Secondary | ICD-10-CM | POA: Diagnosis present

## 2015-07-03 DIAGNOSIS — F0282 Dementia in other diseases classified elsewhere, unspecified severity, with psychotic disturbance: Secondary | ICD-10-CM | POA: Diagnosis present

## 2015-07-03 DIAGNOSIS — N179 Acute kidney failure, unspecified: Secondary | ICD-10-CM | POA: Diagnosis not present

## 2015-07-03 DIAGNOSIS — Z96642 Presence of left artificial hip joint: Secondary | ICD-10-CM | POA: Diagnosis present

## 2015-07-03 DIAGNOSIS — W010XXA Fall on same level from slipping, tripping and stumbling without subsequent striking against object, initial encounter: Secondary | ICD-10-CM | POA: Diagnosis present

## 2015-07-03 DIAGNOSIS — M25551 Pain in right hip: Secondary | ICD-10-CM | POA: Diagnosis present

## 2015-07-03 DIAGNOSIS — G309 Alzheimer's disease, unspecified: Secondary | ICD-10-CM | POA: Diagnosis not present

## 2015-07-03 DIAGNOSIS — N390 Urinary tract infection, site not specified: Secondary | ICD-10-CM | POA: Diagnosis present

## 2015-07-03 DIAGNOSIS — D72829 Elevated white blood cell count, unspecified: Secondary | ICD-10-CM | POA: Diagnosis not present

## 2015-07-03 DIAGNOSIS — S72001A Fracture of unspecified part of neck of right femur, initial encounter for closed fracture: Secondary | ICD-10-CM | POA: Diagnosis not present

## 2015-07-03 DIAGNOSIS — F0280 Dementia in other diseases classified elsewhere without behavioral disturbance: Secondary | ICD-10-CM

## 2015-07-03 DIAGNOSIS — S72044A Nondisplaced fracture of base of neck of right femur, initial encounter for closed fracture: Secondary | ICD-10-CM | POA: Diagnosis not present

## 2015-07-03 DIAGNOSIS — D649 Anemia, unspecified: Secondary | ICD-10-CM | POA: Diagnosis not present

## 2015-07-03 DIAGNOSIS — K219 Gastro-esophageal reflux disease without esophagitis: Secondary | ICD-10-CM | POA: Diagnosis present

## 2015-07-03 DIAGNOSIS — L89312 Pressure ulcer of right buttock, stage 2: Secondary | ICD-10-CM | POA: Diagnosis not present

## 2015-07-03 DIAGNOSIS — Z9049 Acquired absence of other specified parts of digestive tract: Secondary | ICD-10-CM | POA: Diagnosis present

## 2015-07-03 DIAGNOSIS — N189 Chronic kidney disease, unspecified: Secondary | ICD-10-CM | POA: Diagnosis present

## 2015-07-03 DIAGNOSIS — Z419 Encounter for procedure for purposes other than remedying health state, unspecified: Secondary | ICD-10-CM

## 2015-07-03 DIAGNOSIS — M21259 Flexion deformity, unspecified hip: Secondary | ICD-10-CM | POA: Diagnosis not present

## 2015-07-03 DIAGNOSIS — R262 Difficulty in walking, not elsewhere classified: Secondary | ICD-10-CM | POA: Diagnosis not present

## 2015-07-03 DIAGNOSIS — M6281 Muscle weakness (generalized): Secondary | ICD-10-CM | POA: Diagnosis not present

## 2015-07-03 DIAGNOSIS — R1311 Dysphagia, oral phase: Secondary | ICD-10-CM | POA: Diagnosis not present

## 2015-07-03 DIAGNOSIS — D62 Acute posthemorrhagic anemia: Secondary | ICD-10-CM | POA: Diagnosis not present

## 2015-07-03 DIAGNOSIS — R41841 Cognitive communication deficit: Secondary | ICD-10-CM | POA: Diagnosis not present

## 2015-07-03 DIAGNOSIS — S72011A Unspecified intracapsular fracture of right femur, initial encounter for closed fracture: Secondary | ICD-10-CM | POA: Diagnosis not present

## 2015-07-03 DIAGNOSIS — M84359D Stress fracture, hip, unspecified, subsequent encounter for fracture with routine healing: Secondary | ICD-10-CM | POA: Diagnosis not present

## 2015-07-03 DIAGNOSIS — N3000 Acute cystitis without hematuria: Secondary | ICD-10-CM | POA: Diagnosis not present

## 2015-07-03 LAB — CBC WITH DIFFERENTIAL/PLATELET
Basophils Absolute: 0 10*3/uL (ref 0.0–0.1)
Basophils Relative: 0 %
Eosinophils Absolute: 0.1 10*3/uL (ref 0.0–0.7)
Eosinophils Relative: 0 %
HEMATOCRIT: 34.2 % — AB (ref 36.0–46.0)
HEMOGLOBIN: 10.9 g/dL — AB (ref 12.0–15.0)
LYMPHS ABS: 1 10*3/uL (ref 0.7–4.0)
LYMPHS PCT: 8 %
MCH: 28.2 pg (ref 26.0–34.0)
MCHC: 31.9 g/dL (ref 30.0–36.0)
MCV: 88.4 fL (ref 78.0–100.0)
MONOS PCT: 27 %
Monocytes Absolute: 3.5 10*3/uL — ABNORMAL HIGH (ref 0.1–1.0)
NEUTROS ABS: 8.3 10*3/uL — AB (ref 1.7–7.7)
NEUTROS PCT: 65 %
Platelets: 123 10*3/uL — ABNORMAL LOW (ref 150–400)
RBC: 3.87 MIL/uL (ref 3.87–5.11)
RDW: 14.8 % (ref 11.5–15.5)
WBC: 12.8 10*3/uL — AB (ref 4.0–10.5)

## 2015-07-03 LAB — TYPE AND SCREEN
ABO/RH(D): O NEG
ANTIBODY SCREEN: NEGATIVE

## 2015-07-03 LAB — URINE MICROSCOPIC-ADD ON

## 2015-07-03 LAB — URINALYSIS, ROUTINE W REFLEX MICROSCOPIC
Bilirubin Urine: NEGATIVE
GLUCOSE, UA: NEGATIVE mg/dL
Ketones, ur: NEGATIVE mg/dL
Nitrite: NEGATIVE
PROTEIN: NEGATIVE mg/dL
Specific Gravity, Urine: 1.009 (ref 1.005–1.030)
Urobilinogen, UA: 0.2 mg/dL (ref 0.0–1.0)
pH: 7 (ref 5.0–8.0)

## 2015-07-03 LAB — BASIC METABOLIC PANEL
ANION GAP: 9 (ref 5–15)
BUN: 12 mg/dL (ref 6–20)
CO2: 28 mmol/L (ref 22–32)
Calcium: 9.3 mg/dL (ref 8.9–10.3)
Chloride: 105 mmol/L (ref 101–111)
Creatinine, Ser: 1.01 mg/dL — ABNORMAL HIGH (ref 0.44–1.00)
GFR, EST AFRICAN AMERICAN: 55 mL/min — AB (ref >60)
GFR, EST NON AFRICAN AMERICAN: 47 mL/min — AB (ref >60)
Glucose, Bld: 99 mg/dL (ref 65–99)
POTASSIUM: 3.2 mmol/L — AB (ref 3.5–5.1)
SODIUM: 142 mmol/L (ref 135–145)

## 2015-07-03 LAB — ABO/RH: ABO/RH(D): O NEG

## 2015-07-03 LAB — PROTIME-INR
INR: 1.19 (ref 0.00–1.49)
PROTHROMBIN TIME: 15.3 s — AB (ref 11.6–15.2)

## 2015-07-03 MED ORDER — ONDANSETRON HCL 4 MG/2ML IJ SOLN
4.0000 mg | Freq: Four times a day (QID) | INTRAMUSCULAR | Status: DC | PRN
  Administered 2015-07-03: 4 mg via INTRAVENOUS
  Filled 2015-07-03: qty 2

## 2015-07-03 MED ORDER — LORAZEPAM 0.5 MG PO TABS
0.2500 mg | ORAL_TABLET | Freq: Three times a day (TID) | ORAL | Status: DC | PRN
  Administered 2015-07-03 – 2015-07-04 (×2): 0.25 mg via ORAL
  Filled 2015-07-03 (×3): qty 1

## 2015-07-03 MED ORDER — HYDROCODONE-ACETAMINOPHEN 5-325 MG PO TABS
1.0000 | ORAL_TABLET | ORAL | Status: AC | PRN
  Administered 2015-07-03: 1 via ORAL
  Filled 2015-07-03: qty 1

## 2015-07-03 MED ORDER — ONDANSETRON HCL 4 MG/2ML IJ SOLN: 4.0000 mg | Freq: Three times a day (TID) | INTRAMUSCULAR | Status: DC | PRN

## 2015-07-03 MED ORDER — PANTOPRAZOLE SODIUM 40 MG PO TBEC
40.0000 mg | DELAYED_RELEASE_TABLET | Freq: Every day | ORAL | Status: DC
  Administered 2015-07-04 – 2015-07-08 (×4): 40 mg via ORAL
  Filled 2015-07-03 (×5): qty 1

## 2015-07-03 MED ORDER — ACETAMINOPHEN 325 MG PO TABS
650.0000 mg | ORAL_TABLET | Freq: Four times a day (QID) | ORAL | Status: DC | PRN
Start: 2015-07-03 — End: 2015-07-08
  Administered 2015-07-04 (×2): 650 mg via ORAL
  Administered 2015-07-05: 325 mg via ORAL
  Administered 2015-07-05: 650 mg via ORAL
  Filled 2015-07-03 (×5): qty 2

## 2015-07-03 MED ORDER — SUCRALFATE 1 G PO TABS
1.0000 g | ORAL_TABLET | Freq: Three times a day (TID) | ORAL | Status: DC
  Administered 2015-07-04 – 2015-07-08 (×15): 1 g via ORAL
  Filled 2015-07-03 (×21): qty 1

## 2015-07-03 MED ORDER — SALINE SPRAY 0.65 % NA SOLN
1.0000 | Freq: Two times a day (BID) | NASAL | Status: DC
  Administered 2015-07-03 – 2015-07-08 (×7): 1 via NASAL
  Filled 2015-07-03: qty 44

## 2015-07-03 MED ORDER — POLYETHYL GLYCOL-PROPYL GLYCOL 0.4-0.3 % OP SOLN: 2.0000 [drp] | Freq: Every day | OPHTHALMIC | Status: DC | PRN

## 2015-07-03 MED ORDER — ADULT MULTIVITAMIN W/MINERALS CH
1.0000 | ORAL_TABLET | Freq: Every day | ORAL | Status: DC
  Administered 2015-07-04 – 2015-07-08 (×4): 1 via ORAL
  Filled 2015-07-03 (×5): qty 1

## 2015-07-03 MED ORDER — ONDANSETRON HCL 4 MG PO TABS
4.0000 mg | ORAL_TABLET | Freq: Four times a day (QID) | ORAL | Status: DC | PRN
  Filled 2015-07-03: qty 1

## 2015-07-03 MED ORDER — POLYVINYL ALCOHOL 1.4 % OP SOLN
2.0000 [drp] | Freq: Every day | OPHTHALMIC | Status: DC | PRN
  Administered 2015-07-06 – 2015-07-08 (×2): 2 [drp] via OPHTHALMIC
  Filled 2015-07-03: qty 15

## 2015-07-03 MED ORDER — DEXTROSE 5 % IV SOLN
1.0000 g | INTRAVENOUS | Status: DC
  Administered 2015-07-03 – 2015-07-06 (×3): 1 g via INTRAVENOUS
  Filled 2015-07-03 (×6): qty 10

## 2015-07-03 MED ORDER — MORPHINE SULFATE (PF) 2 MG/ML IV SOLN
1.0000 mg | INTRAVENOUS | Status: DC | PRN
  Administered 2015-07-03 – 2015-07-06 (×5): 1 mg via INTRAVENOUS
  Filled 2015-07-03 (×5): qty 1

## 2015-07-03 MED ORDER — DEXTROSE-NACL 5-0.45 % IV SOLN
INTRAVENOUS | Status: DC
  Administered 2015-07-03 – 2015-07-04 (×2): via INTRAVENOUS

## 2015-07-03 MED ORDER — POLYETHYLENE GLYCOL 3350 17 G PO PACK
17.0000 g | PACK | Freq: Every day | ORAL | Status: DC
  Administered 2015-07-04 – 2015-07-08 (×4): 17 g via ORAL

## 2015-07-03 MED ORDER — POLYSACCHARIDE IRON COMPLEX 150 MG PO CAPS
150.0000 mg | ORAL_CAPSULE | Freq: Every day | ORAL | Status: DC
  Administered 2015-07-04 – 2015-07-08 (×4): 150 mg via ORAL
  Filled 2015-07-03 (×5): qty 1

## 2015-07-03 MED ORDER — HYDROCODONE-ACETAMINOPHEN 5-325 MG PO TABS
1.0000 | ORAL_TABLET | ORAL | Status: AC | PRN
  Administered 2015-07-04: 1 via ORAL
  Filled 2015-07-03: qty 1

## 2015-07-03 MED ORDER — OLOPATADINE HCL 0.1 % OP SOLN
1.0000 [drp] | Freq: Two times a day (BID) | OPHTHALMIC | Status: DC
  Administered 2015-07-03 – 2015-07-08 (×9): 1 [drp] via OPHTHALMIC
  Filled 2015-07-03: qty 5

## 2015-07-03 NOTE — Consult Note (Signed)
Reason for Consult:    Impacted nondisplaced subcapital right femoral neck fracture Referring Physician: ED Physician  Lindsey Hensley is an 81 y.o. female.  HPI:    Lindsey Hensley is a 81 y.o. female relatively healthy for her age presents to the ED with a chief complaint of a fall. Patient resides in SNF / memory unit and has no recollection of the events. History is obtained per patient's son and daughter in-law who is currently with her. He received a call around 3 AM this morning from the nursing facility the patient had a fall while in the bathroom. It is unknown whether she had a syncopal episode or a mechanical fall. The patient herself c/o only of back pain and intermittently hip pain and she also complained of abdominal pain earlier on to family members. At baseline she is somewhat ambulatory with a walker, she goes from her room to the dining area at mealtime but usually sits down for the rest of the day. There are no reported fever or chills, no reported chest pains or any other symptoms preceding the fall. In the ED, but hip x-ray showed right nondisplaced hip fracture, blood work stable for her with mild creatinine elevation, mild anemia and trauma cytopenia which are not in. Orthopedic surgery was consulted by Dr. Vanita Panda and [facility] was asked for admission for hip fracture.    Past Medical History  Diagnosis Date  . GERD (gastroesophageal reflux disease)   . Arthritis     hands  . Dislocation of left thumb   . Unsteady gait   . Stroke     slight left sided weakness    Past Surgical History  Procedure Laterality Date  . Cholecystectomy    . Ectopic pregnancy surgery    . Hip arthroplasty Left 05/24/2013    Procedure: ARTHROPLASTY UNIPOLAR HIP;  Surgeon: Johnny Bridge, MD;  Location: [facility];  Service: Orthopedics;  Laterality: Left;  . Colon resection N/A 11/27/2013    Procedure: DIAGNOSTIC LAPAROSCOPY OPEN RESECTION OF SMALL BOWEL;  Surgeon: Shann Medal, MD;  Location: [facility];   Service: General;  Laterality: N/A;  . Colon surgery    . Open reduction internal fixation (orif) metacarpal Left 12/20/2013    Procedure: Open Reduction MCP Dislocation;  Surgeon: Roseanne Kaufman, MD;  Location: [facility];  Service: Orthopedics;  Laterality: Left;    History reviewed. No pertinent family history.  Social History:  reports that she has quit smoking. She has never used smokeless tobacco. She reports that she does not drink alcohol or use illicit drugs.  Allergies:  Allergies  Allergen Reactions  . Sulfa Antibiotics Other (See Comments)    unknown     Results for orders placed or performed during the hospital encounter of 07/03/15 (from the past 48 hour(s))  Basic metabolic panel     Status: Abnormal   Collection Time: 07/03/15  6:49 AM  Result Value Ref Range   Sodium 142 135 - 145 mmol/L   Potassium 3.2 (L) 3.5 - 5.1 mmol/L   Chloride 105 101 - 111 mmol/L   CO2 28 22 - 32 mmol/L   Glucose, Bld 99 65 - 99 mg/dL   BUN 12 6 - 20 mg/dL   Creatinine, Ser 1.01 (H) 0.44 - 1.00 mg/dL   Calcium 9.3 8.9 - 10.3 mg/dL   GFR calc non Af Amer 47 (L) >60 mL/min   GFR calc Af Amer 55 (L) >60 mL/min    Comment: (NOTE) The  eGFR has been calculated using the CKD EPI equation. This calculation has not been validated in all clinical situations. eGFR's persistently <60 mL/min signify possible Chronic Kidney Disease.    Anion gap 9 5 - 15  CBC WITH DIFFERENTIAL     Status: Abnormal   Collection Time: 07/03/15  6:49 AM  Result Value Ref Range   WBC 12.8 (H) 4.0 - 10.5 K/uL   RBC 3.87 3.87 - 5.11 MIL/uL   Hemoglobin 10.9 (L) 12.0 - 15.0 g/dL   HCT 34.2 (L) 36.0 - 46.0 %   MCV 88.4 78.0 - 100.0 fL   MCH 28.2 26.0 - 34.0 pg   MCHC 31.9 30.0 - 36.0 g/dL   RDW 14.8 11.5 - 15.5 %   Platelets 123 (L) 150 - 400 K/uL   Neutrophils Relative % 65 %   Neutro Abs 8.3 (H) 1.7 - 7.7 K/uL   Lymphocytes Relative 8 %   Lymphs Abs 1.0 0.7 - 4.0 K/uL   Monocytes Relative 27 %   Monocytes  Absolute 3.5 (H) 0.1 - 1.0 K/uL   Eosinophils Relative 0 %   Eosinophils Absolute 0.1 0.0 - 0.7 K/uL   Basophils Relative 0 %   Basophils Absolute 0.0 0.0 - 0.1 K/uL  Protime-INR     Status: Abnormal   Collection Time: 07/03/15  6:49 AM  Result Value Ref Range   Prothrombin Time 15.3 (H) 11.6 - 15.2 seconds   INR 1.19 0.00 - 1.49  Type and screen     Status: None   Collection Time: 07/03/15  6:49 AM  Result Value Ref Range   ABO/RH(D) O NEG    Antibody Screen NEG    Sample Expiration 07/06/2015   ABO/Rh     Status: None   Collection Time: 07/03/15  6:51 AM  Result Value Ref Range   ABO/RH(D) O NEG   Urinalysis, Routine w reflex microscopic (not at [facility])     Status: Abnormal   Collection Time: 07/03/15 10:39 AM  Result Value Ref Range   Color, Urine YELLOW YELLOW   APPearance CLOUDY (A) CLEAR   Specific Gravity, Urine 1.009 1.005 - 1.030   pH 7.0 5.0 - 8.0   Glucose, UA NEGATIVE NEGATIVE mg/dL   Hgb urine dipstick SMALL (A) NEGATIVE   Bilirubin Urine NEGATIVE NEGATIVE   Ketones, ur NEGATIVE NEGATIVE mg/dL   Protein, ur NEGATIVE NEGATIVE mg/dL   Urobilinogen, UA 0.2 0.0 - 1.0 mg/dL   Nitrite NEGATIVE NEGATIVE   Leukocytes, UA SMALL (A) NEGATIVE  Urine microscopic-add on     Status: Abnormal   Collection Time: 07/03/15 10:39 AM  Result Value Ref Range   Squamous Epithelial / LPF RARE RARE   WBC, UA 11-20 <3 WBC/hpf   RBC / HPF 7-10 <3 RBC/hpf   Bacteria, UA MANY (A) RARE    Dg Hip Unilat With Pelvis 2-3 Views Right  07/03/2015   CLINICAL DATA:  Patient fell in the bathroom. Patient is holding the right hip. Confusion.  EXAM: DG HIP (WITH OR WITHOUT PELVIS) 2-3V RIGHT  COMPARISON:  CT abdomen and pelvis 12/13/2013.  Abdomen 12/13/2013.  FINDINGS: Degenerative changes in the right hip. Prominent osteophytes on the femoral head. There appears to be acute angulation at the junction of the head and neck which suggests an impacted nondisplaced fracture of the subcapital region.  Right hip hemiarthroplasty with cemented femoral component. Pelvis appears intact. SI joints and symphysis pubis are not displaced. Degenerative changes in the lower lumbar spine. Calcification  in the pelvis probably representing a fibroid.  IMPRESSION: Impacted nondisplaced subcapital right femoral neck fracture. Degenerative changes in the right hip. Left hip arthroplasty.   Electronically Signed   By: Lucienne Capers M.D.   On: 07/03/2015 06:24    Review of Systems  Unable to perform ROS Gastrointestinal: Positive for heartburn.  Musculoskeletal: Positive for joint pain.   Blood pressure 148/71, pulse 94, temperature 100 F (37.8 C), temperature source Oral, resp. rate 15, SpO2 97 %. Physical Exam  Constitutional: She appears well-developed.  HENT:  Head: Normocephalic.  Neck: Neck supple. No JVD present. No tracheal deviation present. No thyromegaly present.  Cardiovascular: Normal rate and intact distal pulses.   Respiratory: Effort normal and breath sounds normal.  GI: There is no tenderness. There is no guarding.  Musculoskeletal:       Right hip: She exhibits decreased range of motion, decreased strength, tenderness and bony tenderness. She exhibits no swelling, no deformity and no laceration.  Lymphadenopathy:    She has no cervical adenopathy.  Skin: Skin is dry.    Assessment/Plan: Impacted nondisplaced subcapital right femoral neck fracture   Discussion was had with the family regarding the patient's fracture and the need for surgery to repair the area. Dr. Alvan Dame would be the one that preforms the surgery, and they are ok with that. NPO will be determined when Dr. Alvan Dame determines a time for surgery. Dr. Alvan Dame will also discuss surgical options with the patient's family.     Pricilla Loveless 07/03/2015, 12:31 PM

## 2015-07-03 NOTE — ED Notes (Signed)
MD notified patient family at bedside.

## 2015-07-03 NOTE — H&P (Signed)
History and Physical    Lindsey Hensley QIO:[id number] DOB: [birth year] DOA: 07/03/2015  Referring physician: Dr. Jeraldine Loots PCP: Martha Clan, MD  Specialists: Orthopedic surgery   Chief Complaint: fall  HPI: Lindsey Hensley is a 81 y.o. female relatively healthy for her age presents to the ED with a chief complaint of a fall. Patient resides in SNF / memory unit and has no recollection of the events. History is obtained per patient's son who is currently in the room. He received a call around 3 AM this morning from the nursing facility the patient had a fall while in the bathroom. It is unknown whether she had a syncopal episode or a mechanical fall. Patient herself endorses only back pain and intermittently hip pain and she also complained of abdominal pain earlier on to family members. Her son reports that she has had a chronic history of nausea and vomiting, previous surgeries with bowel resection, but despite that she's been having constant nausea and vomiting every time she eats a meal. At baseline she is somewhat ambulatory with a walker, she goes from her room to the dining area at mealtime  but usually sits down for the rest of the day. There are no reported fever or chills, no reported chest pains or any other symptoms preceding the fall. In the ED, but hip x-ray showed right nondisplaced hip fracture, blood work stable for her with mild creatinine elevation, mild anemia and trauma cytopenia which are not in. Orthopedic surgery was consulted by Dr. Jeraldine Loots and [facility] was asked for admission for hip fracture   Review of Systems: unable to obtain ROS due to dementia   Past Medical History  Diagnosis Date  . GERD (gastroesophageal reflux disease)   . Arthritis     hands  . Dislocation of left thumb   . Unsteady gait   . Stroke     slight left sided weakness   Past Surgical History  Procedure Laterality Date  . Cholecystectomy    . Ectopic pregnancy surgery    . Hip arthroplasty Left  05/24/2013    Procedure: ARTHROPLASTY UNIPOLAR HIP;  Surgeon: Eulas Post, MD;  Location: [facility] OR;  Service: Orthopedics;  Laterality: Left;  . Colon resection N/A 11/27/2013    Procedure: DIAGNOSTIC LAPAROSCOPY OPEN RESECTION OF SMALL BOWEL;  Surgeon: Kandis Cocking, MD;  Location: [facility];  Service: General;  Laterality: N/A;  . Colon surgery    . Open reduction internal fixation (orif) metacarpal Left 12/20/2013    Procedure: Open Reduction MCP Dislocation;  Surgeon: Dominica Severin, MD;  Location: [facility] OR;  Service: Orthopedics;  Laterality: Left;   Social History:  reports that she has quit smoking. She has never used smokeless tobacco. She reports that she does not drink alcohol or use illicit drugs.  Allergies  Allergen Reactions  . Sulfa Antibiotics Other (See Comments)    unknown   Family history- Obtained from son at bedside  Mother: Died of meningitis post childbirth Father: Died young, unknown cause  Other family: Atrial fibrillation (son, brother, sister), HTN (brother), MI (brother)- No CVA or seizures   Prior to Admission medications   Medication Sig Start Date End Date Taking? Authorizing Provider  acetaminophen (TYLENOL) 325 MG tablet Take 2 tablets (650 mg total) by mouth every 6 (six) hours as needed for mild pain (or Fever >/= 101). 12/17/13  Yes Martha Clan, MD  iron polysaccharides (NIFEREX) 150 MG capsule Take 150 mg by mouth daily.   Yes  Historical Provider, MD  LORazepam (ATIVAN) 0.5 MG tablet Take 0.25 mg by mouth every 8 (eight) hours as needed for anxiety.   Yes Historical Provider, MD  Multiple Vitamin (MULTIVITAMIN WITH MINERALS) TABS tablet Take 1 tablet by mouth daily.   Yes Historical Provider, MD  Olopatadine HCl (PATADAY) 0.2 % SOLN Place 1 drop into both eyes daily.   Yes Historical Provider, MD  omeprazole (PRILOSEC) 20 MG capsule Take 20 mg by mouth daily.   Yes Historical Provider, MD  Polyethyl Glycol-Propyl Glycol (SYSTANE) 0.4-0.3 % SOLN Place 2 drops  into both eyes daily as needed (dry eyes).   Yes Historical Provider, MD  polyethylene glycol (MIRALAX / GLYCOLAX) packet Take 17 g by mouth daily.   Yes Historical Provider, MD  sodium chloride (OCEAN) 0.65 % SOLN nasal spray Place 1 spray into both nostrils 2 (two) times daily.   Yes Historical Provider, MD  sucralfate (CARAFATE) 1 G tablet Take 1 g by mouth 4 (four) times daily -  with meals and at bedtime.   Yes Historical Provider, MD  Vitamin D, Ergocalciferol, (DRISDOL) 50000 UNITS CAPS capsule Take 50,000 Units by mouth every 7 (seven) days. Fridays   Yes Historical Provider, MD  aspirin 325 MG tablet Take 1 tablet (325 mg total) by mouth daily. Patient not taking: Reported on 07/03/2015 05/29/13   Ripudeep Jenna Luo, MD  feeding supplement, ENSURE COMPLETE, (ENSURE COMPLETE) LIQD Take 237 mLs by mouth 2 (two) times daily between meals. Patient not taking: Reported on 07/03/2015 12/17/13   Martha Clan, MD  HYDROcodone-acetaminophen [facility]) 5-325 MG per tablet Take 1-2 tablets by mouth every 6 (six) hours as needed for moderate pain. Patient not taking: Reported on 07/03/2015 05/08/15   Yolande Jolly, MD  metroNIDAZOLE (FLAGYL) 500 MG tablet Take 1 tablet (500 mg total) by mouth 2 (two) times daily. Patient not taking: Reported on 07/03/2015 05/08/15   Yolande Jolly, MD  ondansetron (ZOFRAN) 4 MG/2ML SOLN injection Inject 2 mLs (4 mg total) into the vein every 6 (six) hours as needed for nausea or vomiting. Patient not taking: Reported on 05/08/2015 12/17/13   Martha Clan, MD  pantoprazole (PROTONIX) 40 MG tablet Take 1 tablet (40 mg total) by mouth daily. Patient not taking: Reported on 05/08/2015 12/17/13   Martha Clan, MD  promethazine (PHENERGAN) 12.5 MG tablet Take 1 tablet (12.5 mg total) by mouth 3 (three) times daily before meals. Patient not taking: Reported on 05/08/2015 12/17/13   Martha Clan, MD   Physical Exam: Filed Vitals:   07/03/15 0732 07/03/15 0734 07/03/15 0744 07/03/15 0755   BP:      Pulse:   88   Temp:      TempSrc:      Resp:    18  SpO2: 85% 93% 100%      GENERAL: NAD  HEENT: head NCAT, no scleral icterus. Pupils round and reactive. Mucous membranes are moist. Posterior pharynx clear of any exudate or lesions.  NECK: Supple.  LUNGS: Clear to auscultation. No wheezing or crackles  HEART: Regular rate and rhythm without murmur. 2+ pulses, no JVD, no peripheral edema  ABDOMEN: Soft, nontender, and nondistended. Positive bowel sounds.   EXTREMITIES: Without any cyanosis, clubbing, rash  NEUROLOGIC: Strength 5/5 in upper and LLE,did not test RLE  PSYCHIATRIC: demented   Labs on Admission:  Basic Metabolic Panel:  Recent Labs Lab 07/03/15 0649  NA 142  K 3.2*  CL 105  CO2 28  GLUCOSE 99  BUN 12  CREATININE 1.01*  CALCIUM 9.3   CBC:  Recent Labs Lab 07/03/15 0649  WBC 12.8*  NEUTROABS 8.3*  HGB 10.9*  HCT 34.2*  MCV 88.4  PLT 123*   Radiological Exams on Admission: Dg Hip Unilat With Pelvis 2-3 Views Right  07/03/2015   CLINICAL DATA:  Patient fell in the bathroom. Patient is holding the right hip. Confusion.  EXAM: DG HIP (WITH OR WITHOUT PELVIS) 2-3V RIGHT  COMPARISON:  CT abdomen and pelvis 12/13/2013.  Abdomen 12/13/2013.  FINDINGS: Degenerative changes in the right hip. Prominent osteophytes on the femoral head. There appears to be acute angulation at the junction of the head and neck which suggests an impacted nondisplaced fracture of the subcapital region. Right hip hemiarthroplasty with cemented femoral component. Pelvis appears intact. SI joints and symphysis pubis are not displaced. Degenerative changes in the lower lumbar spine. Calcification in the pelvis probably representing a fibroid.  IMPRESSION: Impacted nondisplaced subcapital right femoral neck fracture. Degenerative changes in the right hip. Left hip arthroplasty.   Electronically Signed   By: Burman Nieves M.D.   On: 07/03/2015 06:24    EKG: Independently  reviewed. 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   Acute on chronic renal failure   Nausea and vomiting   Thrombocytopenia   Iron deficiency anemia   Hypokalemia   Hypertension  Hip Fracture - Xray shoes nondisplaced subcapital R femoral neck fracture  - Orthopedic surgery consulted, surgery scheduled for Sunday (07/05/15) - Manage pain with Morphine PRN  Hypertension - No HTN at baseline, suspected cause is pain from fracture  - Currently 148/71, Continue to monitor   Hypokalemia  - Mild (3.2), Suspected etiology is reported nausea - Replete and monitor   Nausea/Vomiting:  - Per family, patient has had persistent N/V post meals for several year - Patient does not appear malnourished  - Will consult SLP, soft diet for now - IV Zofran PRN  Leukocytosis:  - Mild (12.8), likely reactive - check UA  CKD:  - Mild elevation of SrC (1.02), Acute on chronic  - Monitor, IVF  Iron deficiency Anemia  - stable (Hgb, 10.9) and chronic  - Patient takes iron daily  - Continue iron and monitor   Thrombocytopenia - chronic, stable, no bleeding moted - Continue to monitor   Dementia (Alzhemier's)  - Demented at baseline, lives in memory care unit   History of CVA  - CVA in 2012, no focal neurological deficits at baseline    Diet: NPO Fluids: D51/2NS DVT Prophylaxis: SCD  Code Status: DNR  Family Communication: son bedside  Disposition Plan: admit to inpatient, [address] [place], [place]   Costin M. Elvera Lennox, MD [facility] Pager [phone]  If 7PM-7AM, please contact night-coverage [email or web address] Password [email or web address] 07/03/2015, 8:01 AM

## 2015-07-03 NOTE — ED Notes (Signed)
Bed: WA04 Expected date:  Expected time:  Means of arrival:  Comments: EMS  Fall, hip pain

## 2015-07-03 NOTE — ED Notes (Addendum)
Per EMS, patient from [facility], patient with fall while in bathroom. Staff reports patient favoring her left leg when ambulating, patient c/o right buttocks pain. No deformities noted. Patient with dementia, patient pleasant. Patient with portable DNR.

## 2015-07-03 NOTE — ED Provider Notes (Addendum)
CSN: [id number]     Arrival date & time 07/03/15  0403 History   First MD Initiated Contact with Patient 07/03/15 [phone]     Chief Complaint  Patient presents with  . Fall     (Consider location/radiation/quality/duration/timing/severity/associated sxs/prior Treatment) HPI Patient from nursing home presents after possible fall. Patient has dementia, level V caveat Per nursing home report, the patient fell in the bathroom, has been favoring her left leg while ambulating since the fall. The patient herself initially denies any complaints, pain included. However, when asked specifically about pain, she indicates her right lateral hip.  Past Medical History  Diagnosis Date  . GERD (gastroesophageal reflux disease)   . Arthritis     hands  . Dislocation of left thumb   . Unsteady gait   . Stroke     slight left sided weakness   Past Surgical History  Procedure Laterality Date  . Cholecystectomy    . Ectopic pregnancy surgery    . Hip arthroplasty Left 05/24/2013    Procedure: ARTHROPLASTY UNIPOLAR HIP;  Surgeon: Eulas Post, MD;  Location: [facility] OR;  Service: Orthopedics;  Laterality: Left;  . Colon resection N/A 11/27/2013    Procedure: DIAGNOSTIC LAPAROSCOPY OPEN RESECTION OF SMALL BOWEL;  Surgeon: Kandis Cocking, MD;  Location: [facility] ORS;  Service: General;  Laterality: N/A;  . Colon surgery    . Open reduction internal fixation (orif) metacarpal Left 12/20/2013    Procedure: Open Reduction MCP Dislocation;  Surgeon: Dominica Severin, MD;  Location: [facility] OR;  Service: Orthopedics;  Laterality: Left;   History reviewed. No pertinent family history. Social History  Substance Use Topics  . Smoking status: Former Games developer  . Smokeless tobacco: Never Used     Comment: " I quit along time ago "  . Alcohol Use: No   OB History    No data available     Review of Systems  Unable to perform ROS: Dementia      Allergies  Sulfa antibiotics  Home Medications   Prior to Admission  medications   Medication Sig Start Date End Date Taking? Authorizing Provider  acetaminophen (TYLENOL) 325 MG tablet Take 2 tablets (650 mg total) by mouth every 6 (six) hours as needed for mild pain (or Fever >/= 101). 12/17/13  Yes Martha Clan, MD  iron polysaccharides (NIFEREX) 150 MG capsule Take 150 mg by mouth daily.   Yes Historical Provider, MD  LORazepam (ATIVAN) 0.5 MG tablet Take 0.25 mg by mouth every 8 (eight) hours as needed for anxiety.   Yes Historical Provider, MD  Multiple Vitamin (MULTIVITAMIN WITH MINERALS) TABS tablet Take 1 tablet by mouth daily.   Yes Historical Provider, MD  Olopatadine HCl (PATADAY) 0.2 % SOLN Place 1 drop into both eyes daily.   Yes Historical Provider, MD  omeprazole (PRILOSEC) 20 MG capsule Take 20 mg by mouth daily.   Yes Historical Provider, MD  Polyethyl Glycol-Propyl Glycol (SYSTANE) 0.4-0.3 % SOLN Place 2 drops into both eyes daily as needed (dry eyes).   Yes Historical Provider, MD  polyethylene glycol (MIRALAX / GLYCOLAX) packet Take 17 g by mouth daily.   Yes Historical Provider, MD  sodium chloride (OCEAN) 0.65 % SOLN nasal spray Place 1 spray into both nostrils 2 (two) times daily.   Yes Historical Provider, MD  sucralfate (CARAFATE) 1 G tablet Take 1 g by mouth 4 (four) times daily -  with meals and at bedtime.   Yes Historical Provider, MD  Vitamin D,  Ergocalciferol, (DRISDOL) 50000 UNITS CAPS capsule Take 50,000 Units by mouth every 7 (seven) days. Fridays   Yes Historical Provider, MD  aspirin 325 MG tablet Take 1 tablet (325 mg total) by mouth daily. Patient not taking: Reported on 07/03/2015 05/29/13   Ripudeep Jenna Luo, MD  feeding supplement, ENSURE COMPLETE, (ENSURE COMPLETE) LIQD Take 237 mLs by mouth 2 (two) times daily between meals. Patient not taking: Reported on 07/03/2015 12/17/13   Martha Clan, MD  HYDROcodone-acetaminophen [facility]) 5-325 MG per tablet Take 1-2 tablets by mouth every 6 (six) hours as needed for moderate pain. Patient  not taking: Reported on 07/03/2015 05/08/15   Yolande Jolly, MD  metroNIDAZOLE (FLAGYL) 500 MG tablet Take 1 tablet (500 mg total) by mouth 2 (two) times daily. Patient not taking: Reported on 07/03/2015 05/08/15   Yolande Jolly, MD  ondansetron (ZOFRAN) 4 MG/2ML SOLN injection Inject 2 mLs (4 mg total) into the vein every 6 (six) hours as needed for nausea or vomiting. Patient not taking: Reported on 05/08/2015 12/17/13   Martha Clan, MD  pantoprazole (PROTONIX) 40 MG tablet Take 1 tablet (40 mg total) by mouth daily. Patient not taking: Reported on 05/08/2015 12/17/13   Martha Clan, MD  promethazine (PHENERGAN) 12.5 MG tablet Take 1 tablet (12.5 mg total) by mouth 3 (three) times daily before meals. Patient not taking: Reported on 05/08/2015 12/17/13   Martha Clan, MD   BP 160/73 mmHg  Pulse 83  Temp(Src) 98 F (36.7 C) (Oral)  Resp 20  SpO2 94% Physical Exam  Constitutional: She appears well-developed and well-nourished. No distress.  HENT:  Head: Normocephalic and atraumatic.  Eyes: Conjunctivae and EOM are normal.  Cardiovascular: Normal rate and regular rhythm.   Pulmonary/Chest: Effort normal and breath sounds normal. No stridor. No respiratory distress.  Abdominal: She exhibits no distension.  Musculoskeletal: She exhibits no edema.       Legs: Neurological: She is alert. She displays atrophy. She displays no tremor. No cranial nerve deficit or sensory deficit. She exhibits normal muscle tone. She displays no seizure activity.  Skin: Skin is warm and dry.  Psychiatric: She is slowed and withdrawn. Cognition and memory are impaired.  Nursing note and vitals reviewed.   ED Course  Procedures (including critical care time)   I saw the ECG, relevant labs and studies - I agree with the interpretation.  On repeat exam the patient remains in similar condition, with tenderness about the right lateral hip. X-ray suggests right femoral neck fracture   Demonstrate the patient's  x-rays to her family members. I discussed the patient's x-rays with our orthopedic colleagues, who will evaluate the patient as well.  MDM  Patient presents from nursing facility after a fall. Patient has tenderness to palpation about the right hip, is found to have a right femoral neck fracture. Patient is otherwise in no distress.  After discussion with orthopedic surgery, the patient was admitted for further E/M.   Gerhard Munch, MD 07/03/15 [id number]  Gerhard Munch, MD 07/03/15 [phone]

## 2015-07-03 NOTE — Clinical Social Work Placement (Signed)
   CLINICAL SOCIAL WORK PLACEMENT  NOTE  Date:  07/03/2015  Patient Details  Name: Lindsey Hensley MRN: [id number] Date of Birth: 1922/06/03  Clinical Social Work is seeking post-discharge placement for this patient at the Skilled  Nursing Facility level of care (*CSW will initial, date and re-position this form in  chart as items are completed):  Yes   Patient/family provided with [place] [facility] Clinical Social Work Department's list of facilities offering this level of care within the geographic area requested by the patient (or if unable, by the patient's family).  Yes   Patient/family informed of their freedom to choose among providers that offer the needed level of care, that participate in Medicare, Medicaid or managed care program needed by the patient, have an available bed and are willing to accept the patient.  Yes   Patient/family informed of [facility]'s ownership interest in [facility] and [facility], as well as of the fact that they are under no obligation to receive care at these facilities.  PASRR submitted to EDS on       PASRR number received on       Existing PASRR number confirmed on 07/03/15     FL2 transmitted to all facilities in geographic area requested by pt/family on       FL2 transmitted to all facilities within larger geographic area on       Patient informed that his/her managed care company has contracts with or will negotiate with certain facilities, including the following:            Patient/family informed of bed offers received.  Patient chooses bed at       Physician recommends and patient chooses bed at      Patient to be transferred to   on  .  Patient to be transferred to facility by       Patient family notified on   of transfer.  Name of family member notified:        PHYSICIAN       Additional Comment:    _______________________________________________ Royetta Asal, [place]  [phone] 07/03/2015, 4:46 PM

## 2015-07-03 NOTE — ED Notes (Signed)
MD at bedside. 

## 2015-07-03 NOTE — Clinical Social Work Note (Signed)
Clinical Social Work Assessment  Patient Details  Name: Lindsey Hensley MRN: [id number] Date of Birth: 04/07/1922  Date of referral:  07/03/15               Reason for consult:  Facility Placement, Discharge Planning                Permission sought to share information with:  Facility Contact Representative Permission granted to share information::  Yes, Verbal Permission Granted  Name::        Agency::     Relationship::     Contact Information:     Housing/Transportation Living arrangements for the past 2 months:  Assisted Living Facility Source of Information:  Adult Children Patient Interpreter Needed:  None Criminal Activity/Legal Involvement Pertinent to Current Situation/Hospitalization:  No - Comment as needed Significant Relationships:  Adult Children Lives with:  Facility Resident Do you feel safe going back to the place where you live?   (ST Rehab may be needed.) Need for family participation in patient care:  Yes (Comment)  Care giving concerns:  No concerns reported at this time.   Social Worker assessment / plan:  Pt hospitalized on 07/03/15 from [facility] with a hip fx. Surgery is pending. CSW met with pt / son to assist with d/c planning. Son would like pt to return to ALF at d/c but will consider ST Rehab if needed. CSW has initiated SNF search and bed offers are pending. Pt has [employer] medicare which requires prior authorization for SNF placement. CSW will assist with auth process if SNF is needed.  Employment status:  Retired Insurance information:  Managed Medicare PT Recommendations:  Not assessed at this time Information / Referral to community resources:  Skilled Nursing Facility  Patient/Family's Response to care:  Son hopes pt can return to ALF at d/c.  Patient/Family's Understanding of and Emotional Response to Diagnosis, Current Treatment, and Prognosis:  Pt has dementia. Son is aware of pt's medical status. Surgery and PT recommendations are  pending. CSW will meet again with son following PT recommendations.  Emotional Assessment Appearance:  Appears stated age Attitude/Demeanor/Rapport:  Other (cooperative) Affect (typically observed):  Calm, Pleasant Orientation:  Oriented to Self Alcohol / Substance use:  Not Applicable Psych involvement (Current and /or in the community):  No (Comment)  Discharge Needs  Concerns to be addressed:  Discharge Planning Concerns Readmission within the last 30 days:  No Current discharge risk:  None Barriers to Discharge:      Haidinger, Jamie Lee, LCSW  [phone] 07/03/2015, 4:41 PM  

## 2015-07-04 DIAGNOSIS — N390 Urinary tract infection, site not specified: Secondary | ICD-10-CM | POA: Diagnosis present

## 2015-07-04 DIAGNOSIS — N3000 Acute cystitis without hematuria: Secondary | ICD-10-CM

## 2015-07-04 LAB — BASIC METABOLIC PANEL
Anion gap: 8 (ref 5–15)
BUN: 16 mg/dL (ref 6–20)
CO2: 29 mmol/L (ref 22–32)
CREATININE: 1.12 mg/dL — AB (ref 0.44–1.00)
Calcium: 8.6 mg/dL — ABNORMAL LOW (ref 8.9–10.3)
Chloride: 101 mmol/L (ref 101–111)
GFR calc Af Amer: 48 mL/min — ABNORMAL LOW (ref >60)
GFR, EST NON AFRICAN AMERICAN: 42 mL/min — AB (ref >60)
Glucose, Bld: 110 mg/dL — ABNORMAL HIGH (ref 65–99)
POTASSIUM: 3.4 mmol/L — AB (ref 3.5–5.1)
SODIUM: 138 mmol/L (ref 135–145)

## 2015-07-04 LAB — CBC
HEMATOCRIT: 29.2 % — AB (ref 36.0–46.0)
Hemoglobin: 9.4 g/dL — ABNORMAL LOW (ref 12.0–15.0)
MCH: 28.8 pg (ref 26.0–34.0)
MCHC: 32.2 g/dL (ref 30.0–36.0)
MCV: 89.6 fL (ref 78.0–100.0)
PLATELETS: 101 10*3/uL — AB (ref 150–400)
RBC: 3.26 MIL/uL — ABNORMAL LOW (ref 3.87–5.11)
RDW: 14.9 % (ref 11.5–15.5)
WBC: 16.6 10*3/uL — AB (ref 4.0–10.5)

## 2015-07-04 LAB — SURGICAL PCR SCREEN
MRSA, PCR: NEGATIVE
STAPHYLOCOCCUS AUREUS: POSITIVE — AB

## 2015-07-04 MED ORDER — CHLORHEXIDINE GLUCONATE CLOTH 2 % EX PADS
6.0000 | MEDICATED_PAD | Freq: Every day | CUTANEOUS | Status: DC
  Administered 2015-07-04 – 2015-07-08 (×3): 6 via TOPICAL

## 2015-07-04 MED ORDER — POTASSIUM CHLORIDE 20 MEQ PO PACK: 20.0000 meq | PACK | Freq: Once | ORAL | Status: DC

## 2015-07-04 MED ORDER — MUPIROCIN 2 % EX OINT
1.0000 "application " | TOPICAL_OINTMENT | Freq: Two times a day (BID) | CUTANEOUS | Status: DC
  Administered 2015-07-04 – 2015-07-08 (×6): 1 via NASAL
  Filled 2015-07-04: qty 22

## 2015-07-04 MED ORDER — POTASSIUM CHLORIDE CRYS ER 20 MEQ PO TBCR
20.0000 meq | EXTENDED_RELEASE_TABLET | Freq: Once | ORAL | Status: AC
  Administered 2015-07-04: 20 meq via ORAL
  Filled 2015-07-04: qty 1

## 2015-07-04 NOTE — Progress Notes (Signed)
Subjective: comfortable   Objective: Vital signs in last 24 hours: Temp:  [99.1 F (37.3 C)-100 F (37.8 C)] 99.3 F (37.4 C) (09/24 0624) Pulse Rate:  [77-94] 77 (09/24 0624) Resp:  [12-18] 14 (09/24 0624) BP: (100-148)/(54-77) 110/77 mmHg (09/24 0624) SpO2:  [92 %-97 %] 92 % (09/24 0624)  Intake/Output from previous day: 09/23 0701 - 09/24 0700 In: 1137.5 [P.O.:300; I.V.:837.5] Out: 1050 [Urine:1050] Intake/Output this shift:     Recent Labs  07/03/15 0649 07/04/15 0435  HGB 10.9* 9.4*    Recent Labs  07/03/15 0649 07/04/15 0435  WBC 12.8* 16.6*  RBC 3.87 3.26*  HCT 34.2* 29.2*  PLT 123* 101*    Recent Labs  07/03/15 0649 07/04/15 0435  NA 142 138  K 3.2* 3.4*  CL 105 101  CO2 28 29  BUN 12 16  CREATININE 1.01* 1.12*  GLUCOSE 99 110*  CALCIUM 9.3 8.6*    Recent Labs  07/03/15 0649  INR 1.19    Neurologically intact Intact pulses distally Compartment soft  Assessment/Plan: Left hip fx pending ORIF. [place] to discuss   BEANE,JEFFREY C 07/04/2015, 8:25 AM

## 2015-07-04 NOTE — Evaluation (Signed)
Clinical/Bedside Swallow Evaluation Patient Details  Name: Lindsey Hensley MRN: [id number] Date of Birth: [birth year]  Today's Date: 07/04/2015 Time: SLP Start Time (ACUTE ONLY): 0836 SLP Stop Time (ACUTE ONLY): 0847 SLP Time Calculation (min) (ACUTE ONLY): 11 min  Past Medical History:  Past Medical History  Diagnosis Date  . GERD (gastroesophageal reflux disease)   . Arthritis     hands  . Dislocation of left thumb   . Unsteady gait   . Stroke     slight left sided weakness   Past Surgical History:  Past Surgical History  Procedure Laterality Date  . Cholecystectomy    . Ectopic pregnancy surgery    . Hip arthroplasty Left 05/24/2013    Procedure: ARTHROPLASTY UNIPOLAR HIP;  Surgeon: Eulas Post, MD;  Location: [facility] OR;  Service: Orthopedics;  Laterality: Left;  . Colon resection N/A 11/27/2013    Procedure: DIAGNOSTIC LAPAROSCOPY OPEN RESECTION OF SMALL BOWEL;  Surgeon: Kandis Cocking, MD;  Location: [facility] ORS;  Service: General;  Laterality: N/A;  . Colon surgery    . Open reduction internal fixation (orif) metacarpal Left 12/20/2013    Procedure: Open Reduction MCP Dislocation;  Surgeon: Dominica Severin, MD;  Location: [facility] OR;  Service: Orthopedics;  Laterality: Left;   HPI:  81 y.o. female with PMH: GERD, stroke, chronic nausea and vomiting admitted after a fall in  SNF / memory unit. Found to have right nondisplaced hip fracture. No CXR performed. No prior ST notes.   Assessment / Plan / Recommendation Clinical Impression  Despite a mildly audible swallow, which can indicate structural anamolies, pt's initiation of swallow was within functional limits, no s/s aspiration. Prolonged mastication with solid texture (biscuit) and right buccal cavity residue that pt removed with mod verbal cues. No audible congestion present. SLP suggested a slightly softer texture but she prefers to remain on regular. Recommend intermittent supervision with thin and regular for assurance of oral cavity  clearance. Pills with liquid; if demonstrates difficulty, can give whole or crushed in applesauce. No further ST needed presently.      Aspiration Risk  Mild    Diet Recommendation Age appropriate regular solids;Thin   Medication Administration: Whole meds with liquid Compensations: Slow rate;Small sips/bites;Check for pocketing    Other  Recommendations Oral Care Recommendations: Oral care BID   Follow Up Recommendations       Frequency and Duration        Pertinent Vitals/Pain none    SLP Swallow Goals     Swallow Study Prior Functional Status       General Other Pertinent Information: 81 y.o. female with PMH: GERD, stroke, chronic nausea and vomiting admitted after a fall in  SNF / memory unit. Found to have right nondisplaced hip fracture. No CXR performed. No prior ST notes. Type of Study: Bedside swallow evaluation Previous Swallow Assessment:  (none found) Diet Prior to this Study: Regular;Thin liquids Temperature Spikes Noted: Yes Respiratory Status: Supplemental O2 delivered via (comment) History of Recent Intubation: No Behavior/Cognition: Alert;Cooperative;Pleasant mood;Confused;Requires cueing Oral Cavity - Dentition:  (upper plate, 4 lower teeth) Self-Feeding Abilities: Able to feed self Patient Positioning: Upright in bed Baseline Vocal Quality: Normal Volitional Cough: Strong Volitional Swallow: Able to elicit    Oral/Motor/Sensory Function Overall Oral Motor/Sensory Function: Appears within functional limits for tasks assessed   Ice Chips Ice chips: Not tested   Thin Liquid Thin Liquid: Within functional limits (although mildly audible swallow)    Nectar Thick Nectar Thick Liquid: Not tested  Honey Thick Honey Thick Liquid: Not tested   Puree Puree: Not tested   Solid   GO    Solid: Impaired Oral Phase Impairments: Impaired mastication Oral Phase Functional Implications: Oral residue Pharyngeal Phase Impairments:  (none)       Roque Cash,  Breck Coons 07/04/2015,8:56 AM  Breck Coons Lonell Face.Ed [employer] [phone]

## 2015-07-04 NOTE — Progress Notes (Signed)
PROGRESS NOTE  Lindsey Hensley:[id number] DOB: 1922/08/08 DOA: 07/03/2015 PCP: Martha Clan, MD   HPI: Lindsey Hensley is a 81 y.o. female relatively healthy for her age presents to the ED with a chief complaint of a fall, found to have a UTI and a hip fx.   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   Acute on chronic renal failure   Nausea and vomiting   Thrombocytopenia   Iron deficiency anemia   Hypokalemia   Hypertension    Hip Fracture - Xray shoes nondisplaced subcapital R femoral neck fracture  - Orthopedic surgery consulted, surgery scheduled for Sunday (07/05/15) - Manage pain with Morphine PRN - pain controlled currently   UTI  - UA with evidence of UTI, given leukocytosis, low grade temp, will treat, started Ceftriaxone   Hypertension - No HTN at baseline, suspected cause is pain from fracture  - improved with pain control  Hypokalemia  - replete today   Nausea/Vomiting:  - Per family, patient has had persistent N/V post meals for several year - Patient does not appear malnourished  - SLP recommending regular diet   CKD:  - acute on chronic, stable - Monitor, IVF  Iron deficiency Anemia  - slight dilutional drop today - Continue iron and monitor   Thrombocytopenia - chronic, stable, no bleeding moted - Continue to monitor   Dementia (Alzhemier's)  - Demented at baseline, lives in memory care unit   History of CVA  - CVA in 2012, no focal neurological deficits at baseline   Diet: Diet NPO time specified DIET SOFT Room service appropriate?: No; Fluid consistency:: Thin Fluids: D5NS DVT Prophylaxis: SCD  Code Status: DNR Family Communication: d/w son bedside  Disposition Plan: for OR  tomorrow  Consultants:  Orthopedic surgery   Procedures:  None    Antibiotics Ceftriaxone 9/23 >>   Studies  Dg Hip Unilat With Pelvis 2-3 Views Right  07/03/2015   CLINICAL DATA:  Patient fell in the bathroom. Patient is holding the right hip. Confusion.  EXAM: DG HIP (WITH OR WITHOUT PELVIS) 2-3V RIGHT  COMPARISON:  CT abdomen and pelvis 12/13/2013.  Abdomen 12/13/2013.  FINDINGS: Degenerative changes in the right hip. Prominent osteophytes on the femoral head. There appears to be acute angulation at the junction of the head and neck which suggests an impacted nondisplaced fracture of the subcapital region. Right hip hemiarthroplasty with cemented femoral component. Pelvis appears intact. SI joints and symphysis pubis are not displaced. Degenerative changes in the lower lumbar spine. Calcification in the pelvis probably representing a fibroid.  IMPRESSION: Impacted nondisplaced subcapital right femoral neck fracture. Degenerative changes in the right hip. Left hip arthroplasty.   Electronically Signed   By: Burman Nieves M.D.   On: 07/03/2015 06:24    Objective  Filed Vitals:   07/03/15 1400 07/03/15 2130 07/04/15 0624 07/04/15 1236  BP: 105/54 100/64 110/77 118/51  Pulse: 89 77 77 93  Temp: 99.3 F (37.4 C) 99.1 F (37.3 C) 99.3 F (37.4 C) 100.5 F (38.1 C)  TempSrc: Oral Oral Oral Oral  Resp: SpO2: 96% 93% 92% 93%    Intake/Output Summary (Last 24 hours) at 07/04/15 1306 Last data filed at 07/04/15 1238  Gross per 24 hour  Intake  1737.5 ml  Output    850 ml  Net  887.5 ml   There were no vitals filed for this visit.  Exam:  GENERAL: NAD  HEENT: head NCAT, no scleral icterus. Pupils round and reactive.   NECK: Supple.   LUNGS: Clear to auscultation. No wheezing or crackles  HEART: Regular rate and rhythm without murmur. 2+ pulses, no JVD, no peripheral edema  ABDOMEN: Soft, nontender, and nondistended. Positive bowel sounds.    NEUROLOGIC: non focal   Data Reviewed: Basic Metabolic Panel:  Recent Labs Lab 07/03/15 0649 07/04/15 0435  NA 142 138  K 3.2* 3.4*  CL 105 101  CO2 28 29  GLUCOSE 99 110*  BUN 12 16  CREATININE 1.01* 1.12*  CALCIUM 9.3 8.6*   CBC:  Recent Labs Lab 07/03/15 0649 07/04/15 0435  WBC 12.8* 16.6*  NEUTROABS 8.3*  --   HGB 10.9* 9.4*  HCT 34.2* 29.2*  MCV 88.4 89.6  PLT 123* 101*    Scheduled Meds: . cefTRIAXone (ROCEPHIN)  IV  1 g Intravenous Q24H  . iron polysaccharides  150 mg Oral Daily  . multivitamin with minerals  1 tablet Oral Daily  . olopatadine  1 drop Both Eyes BID  . pantoprazole  40 mg Oral Daily  . polyethylene glycol  17 g Oral Daily  . sodium chloride  1 spray Each Nare BID  . sucralfate  1 g Oral TID WC & HS   Continuous Infusions: . dextrose 5 % and 0.45% NaCl 50 mL/hr at 07/04/15 1102     Pamella Pert, MD [facility] Pager [phone]. If 7 PM - 7 AM, please contact night-coverage at [email or web address], password [facility] 07/04/2015, 1:06 PM  LOS: 1 day

## 2015-07-05 ENCOUNTER — Inpatient Hospital Stay (HOSPITAL_COMMUNITY): Payer: Commercial Managed Care - HMO | Admitting: Anesthesiology

## 2015-07-05 ENCOUNTER — Inpatient Hospital Stay (HOSPITAL_COMMUNITY): Payer: Commercial Managed Care - HMO

## 2015-07-05 ENCOUNTER — Encounter (HOSPITAL_COMMUNITY): Payer: Self-pay | Admitting: Anesthesiology

## 2015-07-05 ENCOUNTER — Encounter (HOSPITAL_COMMUNITY)
Admission: EM | Disposition: A | Discharge status: Skilled Nursing Facility | Payer: Self-pay | Source: Home / Self Care | Attending: Internal Medicine

## 2015-07-05 HISTORY — PX: HIP PINNING,CANNULATED: SHX1758

## 2015-07-05 LAB — BASIC METABOLIC PANEL
ANION GAP: 8 (ref 5–15)
BUN: 15 mg/dL (ref 6–20)
CALCIUM: 8.5 mg/dL — AB (ref 8.9–10.3)
CHLORIDE: 99 mmol/L — AB (ref 101–111)
CO2: 27 mmol/L (ref 22–32)
CREATININE: 0.94 mg/dL (ref 0.44–1.00)
GFR calc non Af Amer: 51 mL/min — ABNORMAL LOW (ref >60)
GFR, EST AFRICAN AMERICAN: 60 mL/min — AB (ref >60)
Glucose, Bld: 105 mg/dL — ABNORMAL HIGH (ref 65–99)
Potassium: 3.5 mmol/L (ref 3.5–5.1)
SODIUM: 134 mmol/L — AB (ref 135–145)

## 2015-07-05 LAB — CBC
HEMATOCRIT: 32.1 % — AB (ref 36.0–46.0)
HEMOGLOBIN: 10.2 g/dL — AB (ref 12.0–15.0)
MCH: 28.5 pg (ref 26.0–34.0)
MCHC: 31.8 g/dL (ref 30.0–36.0)
MCV: 89.7 fL (ref 78.0–100.0)
Platelets: 102 10*3/uL — ABNORMAL LOW (ref 150–400)
RBC: 3.58 MIL/uL — AB (ref 3.87–5.11)
RDW: 14.7 % (ref 11.5–15.5)
WBC: 20.9 10*3/uL — AB (ref 4.0–10.5)

## 2015-07-05 SURGERY — FIXATION, FEMUR, NECK, PERCUTANEOUS, USING SCREW
Anesthesia: General | Site: Hip | Laterality: Right

## 2015-07-05 MED ORDER — SODIUM CHLORIDE 0.9 % IV SOLN
INTRAVENOUS | Status: DC
  Administered 2015-07-05: 18:00:00 via INTRAVENOUS
  Filled 2015-07-05 (×5): qty 1000

## 2015-07-05 MED ORDER — HYDROCODONE-ACETAMINOPHEN 5-325 MG PO TABS
1.0000 | ORAL_TABLET | Freq: Four times a day (QID) | ORAL | Status: DC | PRN
  Administered 2015-07-05: 1 via ORAL
  Filled 2015-07-05: qty 1

## 2015-07-05 MED ORDER — PROPOFOL 10 MG/ML IV BOLUS
INTRAVENOUS | Status: DC | PRN
  Administered 2015-07-05: 80 mg via INTRAVENOUS

## 2015-07-05 MED ORDER — ONDANSETRON HCL 4 MG PO TABS: 4.0000 mg | ORAL_TABLET | Freq: Four times a day (QID) | ORAL | Status: DC | PRN

## 2015-07-05 MED ORDER — FENTANYL CITRATE (PF) 100 MCG/2ML IJ SOLN
INTRAMUSCULAR | Status: AC
  Filled 2015-07-05: qty 4

## 2015-07-05 MED ORDER — METOCLOPRAMIDE HCL 5 MG/ML IJ SOLN: 5.0000 mg | Freq: Three times a day (TID) | INTRAMUSCULAR | Status: DC | PRN

## 2015-07-05 MED ORDER — DOCUSATE SODIUM 100 MG PO CAPS
100.0000 mg | ORAL_CAPSULE | Freq: Two times a day (BID) | ORAL | Status: DC
  Administered 2015-07-05 – 2015-07-08 (×5): 100 mg via ORAL

## 2015-07-05 MED ORDER — 0.9 % SODIUM CHLORIDE (POUR BTL) OPTIME
TOPICAL | Status: DC | PRN
  Administered 2015-07-05: 1000 mL

## 2015-07-05 MED ORDER — METOCLOPRAMIDE HCL 5 MG PO TABS
5.0000 mg | ORAL_TABLET | Freq: Three times a day (TID) | ORAL | Status: DC | PRN
  Filled 2015-07-05: qty 2

## 2015-07-05 MED ORDER — ACETAMINOPHEN 325 MG PO TABS: 650.0000 mg | ORAL_TABLET | Freq: Four times a day (QID) | ORAL | Status: DC | PRN

## 2015-07-05 MED ORDER — CEFAZOLIN SODIUM-DEXTROSE 2-3 GM-% IV SOLR
INTRAVENOUS | Status: AC
  Filled 2015-07-05: qty 50

## 2015-07-05 MED ORDER — ACETAMINOPHEN 10 MG/ML IV SOLN
INTRAVENOUS | Status: DC | PRN
  Administered 2015-07-05: 1000 mg via INTRAVENOUS

## 2015-07-05 MED ORDER — TRAMADOL-ACETAMINOPHEN 37.5-325 MG PO TABS: 1.0000 | ORAL_TABLET | Freq: Four times a day (QID) | ORAL | Status: AC | PRN

## 2015-07-05 MED ORDER — PROPOFOL 10 MG/ML IV BOLUS
INTRAVENOUS | Status: AC
  Filled 2015-07-05: qty 20

## 2015-07-05 MED ORDER — PHENOL 1.4 % MT LIQD: 1.0000 | OROMUCOSAL | Status: DC | PRN

## 2015-07-05 MED ORDER — LIDOCAINE HCL (CARDIAC) 20 MG/ML IV SOLN
INTRAVENOUS | Status: AC
  Filled 2015-07-05: qty 5

## 2015-07-05 MED ORDER — HYDROMORPHONE HCL 1 MG/ML IJ SOLN
INTRAMUSCULAR | Status: DC | PRN
  Administered 2015-07-05 (×5): .2 mg via INTRAVENOUS

## 2015-07-05 MED ORDER — ACETAMINOPHEN 10 MG/ML IV SOLN
INTRAVENOUS | Status: AC
  Filled 2015-07-05: qty 100

## 2015-07-05 MED ORDER — LACTATED RINGERS IV SOLN
INTRAVENOUS | Status: DC
  Administered 2015-07-05: 1 via INTRAVENOUS

## 2015-07-05 MED ORDER — ACETAMINOPHEN 650 MG RE SUPP: 650.0000 mg | Freq: Four times a day (QID) | RECTAL | Status: DC | PRN

## 2015-07-05 MED ORDER — MEPERIDINE HCL 50 MG/ML IJ SOLN: 6.2500 mg | INTRAMUSCULAR | Status: DC | PRN

## 2015-07-05 MED ORDER — FENTANYL CITRATE (PF) 100 MCG/2ML IJ SOLN
INTRAMUSCULAR | Status: AC
  Filled 2015-07-05: qty 2

## 2015-07-05 MED ORDER — ONDANSETRON HCL 4 MG/2ML IJ SOLN: 4.0000 mg | Freq: Four times a day (QID) | INTRAMUSCULAR | Status: DC | PRN

## 2015-07-05 MED ORDER — SUCCINYLCHOLINE CHLORIDE 20 MG/ML IJ SOLN
INTRAMUSCULAR | Status: DC | PRN
  Administered 2015-07-05: 60 mg via INTRAVENOUS

## 2015-07-05 MED ORDER — MENTHOL 3 MG MT LOZG: 1.0000 | LOZENGE | OROMUCOSAL | Status: DC | PRN

## 2015-07-05 MED ORDER — PHENYLEPHRINE 40 MCG/ML (10ML) SYRINGE FOR IV PUSH (FOR BLOOD PRESSURE SUPPORT)
PREFILLED_SYRINGE | INTRAVENOUS | Status: AC
  Filled 2015-07-05: qty 10

## 2015-07-05 MED ORDER — ASPIRIN EC 325 MG PO TBEC: 325.0000 mg | DELAYED_RELEASE_TABLET | Freq: Every day | ORAL | Status: AC

## 2015-07-05 MED ORDER — FENTANYL CITRATE (PF) 100 MCG/2ML IJ SOLN
INTRAMUSCULAR | Status: DC | PRN
  Administered 2015-07-05 (×2): 50 ug via INTRAVENOUS

## 2015-07-05 MED ORDER — POLYETHYLENE GLYCOL 3350 17 G PO PACK
17.0000 g | PACK | Freq: Every day | ORAL | Status: DC | PRN
Start: 2015-07-05 — End: 2015-07-08

## 2015-07-05 MED ORDER — PROMETHAZINE HCL 25 MG/ML IJ SOLN: 6.2500 mg | INTRAMUSCULAR | Status: DC | PRN

## 2015-07-05 MED ORDER — FENTANYL CITRATE (PF) 100 MCG/2ML IJ SOLN
25.0000 ug | INTRAMUSCULAR | Status: DC | PRN
  Administered 2015-07-05 (×2): 25 ug via INTRAVENOUS

## 2015-07-05 MED ORDER — ASPIRIN EC 325 MG PO TBEC
325.0000 mg | DELAYED_RELEASE_TABLET | Freq: Every day | ORAL | Status: DC
  Administered 2015-07-06 – 2015-07-08 (×3): 325 mg via ORAL
  Filled 2015-07-05 (×4): qty 1

## 2015-07-05 MED ORDER — ALUM & MAG HYDROXIDE-SIMETH 200-200-20 MG/5ML PO SUSP: 30.0000 mL | ORAL | Status: DC | PRN

## 2015-07-05 MED ORDER — LIDOCAINE HCL (CARDIAC) 20 MG/ML IV SOLN
INTRAVENOUS | Status: DC | PRN
  Administered 2015-07-05: 50 mg via INTRAVENOUS

## 2015-07-05 MED ORDER — ACETAMINOPHEN 10 MG/ML IV SOLN: INTRAVENOUS | Status: DC | PRN

## 2015-07-05 MED ORDER — SODIUM CHLORIDE 0.9 % IJ SOLN
INTRAMUSCULAR | Status: AC
  Filled 2015-07-05: qty 10

## 2015-07-05 MED ORDER — CEFAZOLIN SODIUM-DEXTROSE 2-3 GM-% IV SOLR
2.0000 g | Freq: Once | INTRAVENOUS | Status: AC
  Administered 2015-07-05: 2 g via INTRAVENOUS
  Filled 2015-07-05: qty 50

## 2015-07-05 MED ORDER — LACTATED RINGERS IV SOLN
INTRAVENOUS | Status: DC | PRN
  Administered 2015-07-05: 10:00:00 via INTRAVENOUS

## 2015-07-05 MED ORDER — HYDROMORPHONE HCL 2 MG/ML IJ SOLN
INTRAMUSCULAR | Status: AC
  Filled 2015-07-05: qty 1

## 2015-07-05 SURGICAL SUPPLY — 36 items
BAG SPEC THK2 15X12 ZIP CLS (MISCELLANEOUS) ×1
BAG ZIPLOCK 12X15 (MISCELLANEOUS) ×2 IMPLANT
BNDG GAUZE ELAST 4 BULKY (GAUZE/BANDAGES/DRESSINGS) ×3 IMPLANT
COVER PERINEAL POST (MISCELLANEOUS) ×3 IMPLANT
DRAPE STERI IOBAN 125X83 (DRAPES) ×3 IMPLANT
DRAPE U-SHAPE 47X51 STRL (DRAPES) ×6 IMPLANT
DRSG AQUACEL AG ADV 3.5X 6 (GAUZE/BANDAGES/DRESSINGS) ×3 IMPLANT
DURAPREP 26ML APPLICATOR (WOUND CARE) ×3 IMPLANT
ELECT REM PT RETURN 9FT ADLT (ELECTROSURGICAL) ×3
ELECTRODE REM PT RTRN 9FT ADLT (ELECTROSURGICAL) ×1 IMPLANT
GLOVE BIOGEL PI IND STRL 7.5 (GLOVE) ×1 IMPLANT
GLOVE BIOGEL PI IND STRL 8.5 (GLOVE) IMPLANT
GLOVE BIOGEL PI INDICATOR 7.5 (GLOVE) ×2
GLOVE BIOGEL PI INDICATOR 8.5 (GLOVE)
GLOVE ECLIPSE 8.0 STRL XLNG CF (GLOVE) IMPLANT
GLOVE ORTHO TXT STRL SZ7.5 (GLOVE) ×3 IMPLANT
GOWN SPEC L3 XXLG W/TWL (GOWN DISPOSABLE) IMPLANT
GOWN STRL REUS W/TWL LRG LVL3 (GOWN DISPOSABLE) ×3 IMPLANT
KIT BASIN OR (CUSTOM PROCEDURE TRAY) ×3 IMPLANT
LIQUID BAND (GAUZE/BANDAGES/DRESSINGS) ×3 IMPLANT
MANIFOLD NEPTUNE II (INSTRUMENTS) ×3 IMPLANT
NS IRRIG 1000ML POUR BTL (IV SOLUTION) ×3 IMPLANT
PACK GENERAL/GYN (CUSTOM PROCEDURE TRAY) ×3 IMPLANT
PIN GUIDE DRILL TIP 2.8X300 (DRILL) ×8 IMPLANT
POSITIONER SURGICAL ARM (MISCELLANEOUS) ×3 IMPLANT
SCREW CANN 6.5X85X40MM THRD (Screw) ×2 IMPLANT
SCREW CANN 6.5X90 (Screw) ×1 IMPLANT
SCREW CANN 6.5X90 16MM THD (Screw) IMPLANT
SCREW CANN 8.0X100 HIP (Screw) ×2 IMPLANT
SCREW CANN HIP 8X105X40 (Screw) ×2 IMPLANT
SCREW CANNULATED 6.5X90MM (Screw) ×3 IMPLANT
SCREW CANNULATED 8.0*85 HIP (Screw) ×2 IMPLANT
SUT MNCRL AB 4-0 PS2 18 (SUTURE) ×3 IMPLANT
SUT VIC AB 2-0 CT1 27 (SUTURE) ×3
SUT VIC AB 2-0 CT1 TAPERPNT 27 (SUTURE) ×1 IMPLANT
TOWEL OR 17X26 10 PK STRL BLUE (TOWEL DISPOSABLE) ×3 IMPLANT

## 2015-07-05 NOTE — Anesthesia Postprocedure Evaluation (Signed)
  Anesthesia Post-op Note  Patient: Lindsey Hensley  Procedure(s) Performed: Procedure(s): CANNULATED HIP PINNING (Right)  Patient Location: PACU  Anesthesia Type:General  Level of Consciousness: awake  Airway and Oxygen Therapy: Patient Spontanous Breathing and Patient connected to nasal cannula oxygen  Post-op Pain: mild  Post-op Assessment: Post-op Vital signs reviewed and Patient's Cardiovascular Status Stable LLE Motor Response: Purposeful movement LLE Sensation: Full sensation RLE Motor Response: Purposeful movement RLE Sensation: Full sensation, Pain      Post-op Vital Signs: Reviewed and stable  Last Vitals:  Filed Vitals:   07/05/15 1155  BP: 115/94  Pulse: 84  Temp:   Resp: 16    Complications: No apparent anesthesia complications

## 2015-07-05 NOTE — Anesthesia Preprocedure Evaluation (Addendum)
Anesthesia Evaluation  Patient identified by MRN, date of birth, ID band Patient awake    Reviewed: Allergy & Precautions, NPO status , Patient's Chart, lab work & pertinent test results  Airway Mallampati: II   Neck ROM: Full    Dental  (+) Upper Dentures   Pulmonary former smoker,    breath sounds clear to auscultation       Cardiovascular hypertension,  Rhythm:Regular Rate:Normal     Neuro/Psych PSYCHIATRIC DISORDERS CVA    GI/Hepatic Neg liver ROS, GERD  Medicated,  Endo/Other    Renal/GU Renal InsufficiencyRenal disease  negative genitourinary   Musculoskeletal  (+) Arthritis , Osteoarthritis,    Abdominal   Peds negative pediatric ROS (+)  Hematology   Anesthesia Other Findings   Reproductive/Obstetrics                            Lab Results  Component Value Date   WBC 20.9* 07/05/2015   HGB 10.2* 07/05/2015   HCT 32.1* 07/05/2015   MCV 89.7 07/05/2015   PLT 102* 07/05/2015   Lab Results  Component Value Date   CREATININE 0.94 07/05/2015   BUN 15 07/05/2015   NA 134* 07/05/2015   K 3.5 07/05/2015   CL 99* 07/05/2015   CO2 27 07/05/2015   Lab Results  Component Value Date   INR 1.19 07/03/2015   INR 1.10 02/25/2011   EKG: normal sinus rhythm.   Anesthesia Physical Anesthesia Plan  ASA: III and emergent  Anesthesia Plan: General   Post-op Pain Management:    Induction: Intravenous  Airway Management Planned:   Additional Equipment:   Intra-op Plan:   Post-operative Plan: Extubation in OR  Informed Consent: I have reviewed the patients History and Physical, chart, labs and discussed the procedure including the risks, benefits and alternatives for the proposed anesthesia with the patient or authorized representative who has indicated his/her understanding and acceptance.   Dental advisory given  Plan Discussed with: CRNA  Anesthesia Plan Comments:          Anesthesia Quick Evaluation

## 2015-07-05 NOTE — Progress Notes (Signed)
Patient ID: Lindsey Hensley, female   DOB: 1922/08/09, 81 y.o.   MRN: [id number] Right non-displaced femoral neck fracture  Reviewed current hospital course as it pertains to leukocytosis and low grade fever.  Have spoken to her son and her Hospitalist.   At this point we are going to proceed with surgical stabilization of her fracture.  Reviewed planned procedure, risks, benefits of percutaneous screws versus hemiarthroplasty with her son, he will sign consent  NPO  To OR this am Ancef peri-operatively

## 2015-07-05 NOTE — Anesthesia Procedure Notes (Signed)
Procedure Name: Intubation Date/Time: 07/05/2015 10:10 AM Performed by: Shona Simpson D Pre-anesthesia Checklist: Patient identified, Patient being monitored, Timeout performed, Emergency Drugs available and Suction available Patient Re-evaluated:Patient Re-evaluated prior to inductionOxygen Delivery Method: Circle system utilized Preoxygenation: Pre-oxygenation with 100% oxygen Intubation Type: IV induction Ventilation: Mask ventilation without difficulty Laryngoscope Size: Mac and 3 Grade View: Grade I Tube type: Oral Tube size: 7.0 mm Number of attempts: 1 Airway Equipment and Method: Stylet Placement Confirmation: ETT inserted through vocal cords under direct vision,  positive ETCO2 and breath sounds checked- equal and bilateral Secured at: 21 cm Tube secured with: Tape Dental Injury: Teeth and Oropharynx as per pre-operative assessment

## 2015-07-05 NOTE — Progress Notes (Signed)
PA on unit & informed of pt's night time temps & AM WBCs. MD was notified earlier when he called to unit. [employer]

## 2015-07-05 NOTE — Brief Op Note (Signed)
07/03/2015 - 07/05/2015  9:59 AM  PATIENT:  Lindsey Hensley  81 y.o. female  PRE-OPERATIVE DIAGNOSIS:  RIGHT FEMORAL NECK FRACTURE, non-displaced  POST-OPERATIVE DIAGNOSIS:  RIGHT FEMORAL NECK FRACTURE, non-displaced  PROCEDURE:  Procedure(s): CANNULATED HIP PINNING (Right)  SURGEON:  Surgeon(s) and Role:    * Durene Romans, MD - Primary  PHYSICIAN ASSISTANT: None  ASSISTANTS: Surgical team   ANESTHESIA:   general  EBL:  Total I/O In: 0  Out: 75 [Urine:75]  BLOOD ADMINISTERED:none  DRAINS: none   LOCAL MEDICATIONS USED:  NONE  SPECIMEN:  No Specimen  DISPOSITION OF SPECIMEN:  N/A  COUNTS:  YES  TOURNIQUET:  * No tourniquets in log *  DICTATION: .Other Dictation: Dictation Number [id number]  PLAN OF CARE: Admit to inpatient   PATIENT DISPOSITION:  PACU - hemodynamically stable.   Delay start of Pharmacological VTE agent (>24hrs) due to surgical blood loss or risk of bleeding: no

## 2015-07-05 NOTE — Progress Notes (Signed)
PROGRESS NOTE  Lindsey Hensley [id number]:[id number] DOB: May 13, 1922 DOA: 07/03/2015 PCP: Martha Clan, MD   HPI: Lindsey Hensley is a 81 y.o. female relatively healthy for her age presents to the ED with a chief complaint of a fall, found to have a UTI and a hip fx.   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   Acute on chronic renal failure   Nausea and vomiting   Thrombocytopenia   Iron deficiency anemia   Hypokalemia   Hypertension   UTI (urinary tract infection)   Hip Fracture - Xray shoes nondisplaced subcapital R femoral neck fracture  - Orthopedic surgery consulted, surgery scheduled for today - pain control  UTI  - UA with evidence of UTI, given leukocytosis, low grade temp, will treat, started Ceftriaxone  - cultures pending  Hypertension - No HTN at baseline, suspected cause is pain from fracture  - improved with pain control  Hypokalemia  - improved  Nausea/Vomiting:  - Per family, patient has had persistent N/V post meals for several year - Patient does not appear malnourished  - SLP recommending regular diet   CKD:  - acute on chronic, stable - Monitor, IVF  Iron deficiency Anemia  - slight dilutional drop today - Continue iron and monitor   Thrombocytopenia - chronic, stable, no bleeding noted  Dementia (Alzhemier's)  - Demented at baseline, lives in memory care unit   History of CVA  - CVA in 2012, no focal neurological deficits at baseline    Diet: Diet NPO time specified Fluids: D5NS DVT Prophylaxis: SCD  Code Status: DNR Family Communication: d/w son bedside  Disposition Plan: for OR tomorrow  Consultants:  Orthopedic surgery   Procedures:  None    Antibiotics Ceftriaxone 9/23  >>   Studies  No results found.  Objective  Filed Vitals:   07/04/15 1541 07/04/15 2045 07/04/15 2123 07/05/15 0632  BP:  109/49  113/53  Pulse:  97  90  Temp:  100.9 F (38.3 C) 100.3 F (37.9 C) 100.2 F (37.9 C)  TempSrc:  Oral  Oral  Resp:  20  16  Height:  (1.676 m)     Weight: 54.432 kg (120 lb)     SpO2:  94%  93%    Intake/Output Summary (Last 24 hours) at 07/05/15 0707 Last data filed at 07/05/15 0272  Gross per 24 hour  Intake    820 ml  Output   1075 ml  Net   -255 ml   Filed Weights   07/04/15 1541  Weight: 54.432 kg (120 lb)    Exam:  GENERAL: NAD  HEENT: head NCAT, no scleral icterus. Pupils round and reactive.   NECK: Supple.   LUNGS: Clear to auscultation. No wheezing or crackles  HEART: Regular rate and rhythm without murmur. 2+ pulses, no JVD, no peripheral edema  ABDOMEN: Soft, nontender, and nondistended. Positive bowel sounds.   NEUROLOGIC: non focal   Data Reviewed: Basic Metabolic Panel:  Recent Labs Lab 07/03/15 0649 07/04/15 0435 07/05/15 0441  NA 142 138 134*  K 3.2* 3.4* 3.5  CL 105 101 99*  CO2 GLUCOSE 99 110* 105*  BUN CREATININE 1.01* 1.12* 0.94  CALCIUM 9.3 8.6* 8.5*   CBC:  Recent Labs Lab 07/03/15 0649 07/04/15 0435 07/05/15 0441  WBC 12.8* 16.6* 20.9*  NEUTROABS 8.3*  --   --   HGB 10.9* 9.4* 10.2*  HCT 34.2* 29.2* 32.1*  MCV 88.4 89.6 89.7  PLT 123* 101* 102*    Scheduled Meds: . cefTRIAXone (ROCEPHIN)  IV  1 g Intravenous Q24H  . Chlorhexidine Gluconate Cloth  6 each Topical Daily  . iron polysaccharides  150 mg Oral Daily  . multivitamin with minerals  1 tablet Oral Daily  . mupirocin ointment  1 application Nasal BID  . olopatadine  1 drop Both Eyes BID  . pantoprazole  40 mg Oral Daily  . polyethylene glycol  17 g Oral Daily  . sodium chloride  1 spray Each Nare BID  . sucralfate  1 g Oral TID WC & HS   Continuous Infusions: . dextrose 5 % and 0.45% NaCl 50  mL/hr at 07/04/15 1102   Pamella Pert, MD [facility] Pager [phone]. If 7 PM - 7 AM, please contact night-coverage at [email or web address], password [facility] 07/05/2015, 7:07 AM  LOS: 2 days

## 2015-07-05 NOTE — Transfer of Care (Signed)
Immediate Anesthesia Transfer of Care Note  Patient: Lindsey Hensley  Procedure(s) Performed: Procedure(s): CANNULATED HIP PINNING (Right)  Patient Location: PACU  Anesthesia Type:General  Level of Consciousness: awake  Airway & Oxygen Therapy: Patient Spontanous Breathing and Patient connected to face mask oxygen  Post-op Assessment: Report given to RN and Post -op Vital signs reviewed and stable  Post vital signs: Reviewed and stable  Last Vitals:  Filed Vitals:   07/05/15 0912  BP: 113/63  Pulse: 83  Temp: 36.7 C  Resp: 16    Complications: No apparent anesthesia complications

## 2015-07-05 NOTE — Op Note (Signed)
NAMESHONTELL, PROSSER                 ACCOUNT NO.:  [id number]  MEDICAL RECORD NO.:  [id number]  LOCATION:  [facility]                         FACILITY:  [facility]  PHYSICIAN:  Madlyn Frankel. Charlann Boxer, M.D.  DATE OF BIRTH:  Oct 07, 1922  DATE OF PROCEDURE:  07/05/2015 DATE OF DISCHARGE:                              OPERATIVE REPORT   PREOPERATIVE DIAGNOSIS:  Right nondisplaced subcapital femoral neck fracture.  POSTOPERATIVE DIAGNOSIS:  Right nondisplaced subcapital femoral neck fracture.  PROCEDURE:  Closed reduction and percutaneous cannulated screw fixation, right femoral neck fracture.  SURGEON:  Madlyn Frankel. Charlann Boxer, M.D.  ASSISTANT:  Surgical team.  ANESTHESIA:  General.  BLOOD LOSS:  Less than 50 mL.  COMPLICATION:  None.  INDICATION FOR PROCEDURE:  Ms. Lumadue is a 81 year old female with medical comorbidities including advanced dementia.  She had a history of left femoral neck fracture that was treated with a hemiarthroplasty. She had an unwitnessed ground level fall and had an injury to the right hip with pain with any movement of hip, difficult time with weightbearing.  She was brought to the emergency room where radiographs revealed a subcapital nondisplaced hip fracture.  After reviewing with her son her current situation, I felt that her disability over fracture pattern, I warranted fixation as opposed to replacement in this 81 year old female.  Risks of nonunion, need for future surgery all discussed in addition to routine concerns of infection or DVT.  Consent was obtained through her son for this fracture management.  PROCEDURE IN DETAIL:  The patient was brought to operative theater. Once adequate anesthesia, preoperative antibiotics, Ancef administered, she was positioned supine on the OSI Hana table.  The left leg was flexed and abducted out of way with bony prominences padded.  The perineal post was positioned.  Once positioned, her right foot was placed into the traction  boot.  At this point, fluoroscopy was brought to the field and we confirmed the reduction and maintenance of reduction of the fracture on AP and lateral planes.  Gentle traction applied mainly just to hold the leg in place.  A time-out was performed identifying the patient, planned procedure, and extremity.  The right hip was then prepped and draped in sterile fashion using shower curtain technique.  At this point, fluoroscopy was brought back to the field, landmarks were identified in AP and lateral planes for orientation screw position.  An incision was made laterally along this thigh at the level of the lesser trochanter for initial screw insertion.  At this point, under fluoroscopic imaging, I placed 3 guidewires, 1 inferior on the AP view and centrally on the lateral view into the femoral head and neck region, followed by 2 other screws placed proximal, this one anterior and one posterior.  Once I was satisfied with the orientation of the screws to provide stability of the fracture pattern, I measured the depth and selected screws.  Based on bone quality and the nature of the fracture, I did select to use 40 mm partially-threaded cancellous screws.  I used an 8.0 cannulated screw, and the inferior screw and one 8.0 mm screw for the posterior screw and then a 6.5 mm screw for the anterior  screw.  All screws were positioned and confirmed radiographically.  Guidewires were removed.  The final radiographs were obtained in AP and lateral planes. The wound was irrigated in normal saline solution.  I placed a #1 Vicryl suture in the iliotibial band fascia and then used a 2-0 Vicryl and a running 4-0 Monocryl.  The hip was then cleaned, dried, and dressed sterilely using surgical glue and Aquacel dressing.  She was then brought to the recovery room in stable condition, tolerating the procedure well.  We will have her be partial weightbearing and that this would be a difficult  thing to restrict.  I feel that we have enough stability of her fracture site that if she were to do extra ambulating that would hold up.     Madlyn Frankel Charlann Boxer, M.D.     MDO/MEDQ  D:  07/05/2015  T:  07/05/2015  Job:  [id number]

## 2015-07-06 ENCOUNTER — Inpatient Hospital Stay (HOSPITAL_COMMUNITY): Payer: Commercial Managed Care - HMO

## 2015-07-06 LAB — CBC
HCT: 28.2 % — ABNORMAL LOW (ref 36.0–46.0)
HEMOGLOBIN: 9 g/dL — AB (ref 12.0–15.0)
MCH: 28.7 pg (ref 26.0–34.0)
MCHC: 31.9 g/dL (ref 30.0–36.0)
MCV: 89.8 fL (ref 78.0–100.0)
Platelets: 81 10*3/uL — ABNORMAL LOW (ref 150–400)
RBC: 3.14 MIL/uL — AB (ref 3.87–5.11)
RDW: 14.9 % (ref 11.5–15.5)
WBC: 25.1 10*3/uL — ABNORMAL HIGH (ref 4.0–10.5)

## 2015-07-06 LAB — URINE CULTURE: CULTURE: NO GROWTH

## 2015-07-06 LAB — BASIC METABOLIC PANEL
Anion gap: 9 (ref 5–15)
BUN: 18 mg/dL (ref 6–20)
CHLORIDE: 103 mmol/L (ref 101–111)
CO2: 24 mmol/L (ref 22–32)
Calcium: 8.3 mg/dL — ABNORMAL LOW (ref 8.9–10.3)
Creatinine, Ser: 0.93 mg/dL (ref 0.44–1.00)
GFR calc non Af Amer: 52 mL/min — ABNORMAL LOW (ref >60)
Glucose, Bld: 105 mg/dL — ABNORMAL HIGH (ref 65–99)
POTASSIUM: 3.8 mmol/L (ref 3.5–5.1)
SODIUM: 136 mmol/L (ref 135–145)

## 2015-07-06 MED ORDER — AZITHROMYCIN 250 MG PO TABS
250.0000 mg | ORAL_TABLET | Freq: Every day | ORAL | Status: DC
  Administered 2015-07-07 – 2015-07-08 (×2): 250 mg via ORAL
  Filled 2015-07-06 (×2): qty 1

## 2015-07-06 MED ORDER — AZITHROMYCIN 500 MG PO TABS
500.0000 mg | ORAL_TABLET | Freq: Every day | ORAL | Status: AC
  Administered 2015-07-06: 500 mg via ORAL
  Filled 2015-07-06: qty 1

## 2015-07-06 NOTE — Care Management Important Message (Signed)
Important Message  Patient Details  Name: Lindsey Hensley MRN: [id number] Date of Birth: 01-Jun-1922   Medicare Important Message Given:  Yes-second notification given    Haskell Flirt 07/06/2015, 12:47 PMImportant Message  Patient Details  Name: Lindsey Hensley MRN: [id number] Date of Birth: 1922/06/24   Medicare Important Message Given:  Yes-second notification given    Haskell Flirt 07/06/2015, 12:47 PM

## 2015-07-06 NOTE — Evaluation (Signed)
Physical Therapy Evaluation Patient Details Name: Lindsey Hensley MRN: [id number] DOB: [birth year] Today's Date: 07/06/2015   History of Present Illness  81 yo female s/p R hip pinning per Ludger Nutting; PMHx: dementia, L hip surgery  Clinical Impression  Pt admitted with above diagnosis. Pt currently with functional limitations due to the deficits listed below (see PT Problem List).  Pt will benefit from skilled PT to increase their independence and safety with mobility to allow discharge to the venue listed below.   Pt requiring +2 assist for bed mobility today; she is cooperative with sitting EOB but unable to maintain sitting without support, pain is also a limiting factor; pt will benefit from SNF post acute; will follow     Follow Up Recommendations SNF;Supervision/Assistance - 24 hour    Equipment Recommendations  None recommended by PT    Recommendations for Other Services       Precautions / Restrictions Precautions Precautions: Fall Restrictions RLE Weight Bearing: Partial weight bearing      Mobility  Bed Mobility                  Transfers                    Ambulation/Gait                Stairs            Wheelchair Mobility    Modified Rankin (Stroke Patients Only)       Balance Overall balance assessment: Needs assistance   Sitting balance-Leahy Scale: Poor Sitting balance - Comments: pt with multi-directional LOB today while sitting EOB, corrects partially with tactile cues but unable to maintain balance without support                                     Pertinent Vitals/Pain Pain Assessment: Faces Faces Pain Scale: Hurts even more Pain Location: right hip Pain Descriptors / Indicators: Grimacing;Guarding Pain Intervention(s): Limited activity within patient's tolerance;Monitored during session    Home Living Family/patient expects to be discharged to:: Skilled nursing facility                  Additional Comments: pt lived at Memory Care prior to this adm    Prior Function Level of Independence: Independent with assistive device(s)         Comments: used walker but not reliant on it per pt son, she did not put much  weight on it with her UEs     Hand Dominance        Extremity/Trunk Assessment   Upper Extremity Assessment: Overall WFL for tasks assessed           Lower Extremity Assessment: RLE deficits/detail         Communication   Communication: No difficulties  Cognition Arousal/Alertness: Awake/alert Behavior During Therapy: WFL for tasks assessed/performed Overall Cognitive Status: History of cognitive impairments - at baseline                      General Comments      Exercises        Assessment/Plan    PT Assessment Patient needs continued PT services  PT Diagnosis Difficulty walking   PT Problem List Decreased strength;Decreased range of motion;Decreased activity tolerance;Decreased balance;Decreased mobility;Decreased safety awareness;Decreased cognition  PT Treatment Interventions DME instruction;Gait training;Functional mobility training;Therapeutic activities;Therapeutic  exercise   PT Goals (Current goals can be found in the Care Plan section) Acute Rehab PT Goals Patient Stated Goal: son wants pt to be able to return to memory care PT Goal Formulation: With patient/family Time For Goal Achievement: 07/13/15 Potential to Achieve Goals: Fair    Frequency Min 3X/week   Barriers to discharge        Co-evaluation               End of Session   Activity Tolerance: Patient limited by pain Patient left: in bed;with call bell/phone within reach;with bed alarm set;with family/visitor present Nurse Communication: Mobility status         Time: 1023-1040 PT Time Calculation (min) (ACUTE ONLY): 17 min   Charges:   PT Evaluation $Initial PT Evaluation Tier I: 1 Procedure     PT G CodesDrucilla Chalet Jul 08, 2015, 2:24 PM

## 2015-07-06 NOTE — Progress Notes (Signed)
PROGRESS NOTE  Lindsey Hensley BJY:[id number] DOB: 1921-10-21 DOA: 07/03/2015 PCP: Martha Clan, MD   HPI: Lindsey Hensley is a 81 y.o. female relatively healthy for her age presents to the ED with a chief complaint of a fall, found to have a UTI and a hip fx.   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   Acute on chronic renal failure   Nausea and vomiting   Thrombocytopenia   Iron deficiency anemia   Hypokalemia   Hypertension   UTI (urinary tract infection)   Hip Fracture - Xray shoes nondisplaced subcapital R femoral neck fracture  - Orthopedic surgery consulted, s/p cannulated hip pinning 9/25  - pain control  Leukocytosis - patient with persistent leukocytosis this morning, may be reactive however is with significant elevation - is getting Ceftriaxone for UTI since admission, CXR with potential infiltrate, add Z pack for 3 days  - ROS unreliable given dementia  UTI  - UA with evidence of UTI, given leukocytosis, low grade temp, will treat, started Ceftriaxone  - cultures pending  Hypertension - No HTN at baseline, suspected cause is pain from fracture  - improved with pain control  Hypokalemia  - improved  Nausea/Vomiting:  - Per family, patient has had persistent N/V post meals for several year - Patient does not appear malnourished  - SLP recommending regular diet   CKD:  - acute on chronic, stable - Monitor, IVF  Iron deficiency Anemia  - slight dilutional drop today - Continue iron and monitor   Thrombocytopenia - chronic, stable, no bleeding noted  Dementia (Alzhemier's)  - Demented at baseline, lives in memory care unit   History of CVA  - CVA in 2012, no focal neurological deficits at baseline    Diet: Diet  regular Room service appropriate?: Yes; Fluid consistency:: Thin Fluids: D5NS DVT Prophylaxis: SCD  Code Status: DNR Family Communication: d/w son bedside  Disposition Plan: for OR tomorrow  Consultants:  Orthopedic surgery   Procedures:  cannulated hip pinning 9/25    Antibiotics Ceftriaxone 9/23 >>   Studies  Dg C-arm 1-60 Min-no Report  07/05/2015   CLINICAL DATA: cannulated hip pinning right hip   C-ARM 1-60 MINUTES  Fluoroscopy was utilized by the requesting physician.  No radiographic  interpretation.    Dg Hip Operative Unilat With Pelvis Right  07/05/2015   CLINICAL DATA:  Patient with history of right hip fracture.  ORIF.  EXAM: DG C-ARM 1-60 MIN-NO REPORT; OPERATIVE RIGHT HIP WITH PELVIS  COMPARISON:  Hip radiograph 07/03/2015  FINDINGS: Multiple screws traverse the right femoral neck. Two intraoperative fluoroscopic images were presented. Fracture line appears in appropriate anatomic alignment.  IMPRESSION: Patient status post ORIF right femoral neck fracture.   Electronically Signed   By: Annia Belt M.D.   On: 07/05/2015 11:33    Objective  Filed Vitals:   07/05/15 1845 07/05/15 2145 07/05/15 2300 07/06/15 0200  BP: 110/54 118/63  107/50  Pulse: 88 100 90 92  Temp: 98.1 F (36.7 C) 100.4 F (38 C) 98.7 F (37.1 C) 99 F (37.2 C)  TempSrc: Oral Oral  Oral  Resp: Height:      Weight:      SpO2: 97% 93% 95% 93%  Intake/Output Summary (Last 24 hours) at 07/06/15 0702 Last data filed at 07/06/15 0549  Gross per 24 hour  Intake 3481.25 ml  Output    805 ml  Net 2676.25 ml   Filed Weights   07/04/15 1541  Weight: 54.432 kg (120 lb)    Exam:  GENERAL: NAD  HEENT: head NCAT, no scleral icterus. Pupils round and reactive.   NECK: Supple.   LUNGS: Clear to auscultation. No wheezing or crackles  HEART: Regular rate and rhythm without murmur. 2+ pulses, no JVD, no peripheral edema  ABDOMEN: Soft, nontender, and nondistended.  Positive bowel sounds.   NEUROLOGIC: non focal   Data Reviewed: Basic Metabolic Panel:  Recent Labs Lab 07/03/15 0649 07/04/15 0435 07/05/15 0441 07/06/15 0455  NA 142 138 134* 136  K 3.2* 3.4* 3.5 3.8  CL 105 101 99* 103  CO2 GLUCOSE 99 110* 105* 105*  BUN CREATININE 1.01* 1.12* 0.94 0.93  CALCIUM 9.3 8.6* 8.5* 8.3*   CBC:  Recent Labs Lab 07/03/15 0649 07/04/15 0435 07/05/15 0441 07/06/15 0455  WBC 12.8* 16.6* 20.9* 25.1*  NEUTROABS 8.3*  --   --   --   HGB 10.9* 9.4* 10.2* 9.0*  HCT 34.2* 29.2* 32.1* 28.2*  MCV 88.4 89.6 89.7 89.8  PLT 123* 101* 102* 81*    Scheduled Meds: . aspirin EC  325 mg Oral Q breakfast  . cefTRIAXone (ROCEPHIN)  IV  1 g Intravenous Q24H  . Chlorhexidine Gluconate Cloth  6 each Topical Daily  . docusate sodium  100 mg Oral BID  . iron polysaccharides  150 mg Oral Daily  . multivitamin with minerals  1 tablet Oral Daily  . mupirocin ointment  1 application Nasal BID  . olopatadine  1 drop Both Eyes BID  . pantoprazole  40 mg Oral Daily  . polyethylene glycol  17 g Oral Daily  . sodium chloride  1 spray Each Nare BID  . sucralfate  1 g Oral TID WC & HS   Continuous Infusions: . dextrose 5 % and 0.45% NaCl 50 mL/hr at 07/04/15 1102  . sodium chloride 0.9 % 1,000 mL with potassium chloride 10 mEq infusion 50 mL/hr at 07/05/15 1745   Pamella Pert, MD [facility] Pager [phone]. If 7 PM - 7 AM, please contact night-coverage at [email or web address], password [facility] 07/06/2015, 7:02 AM  LOS: 3 days

## 2015-07-06 NOTE — Progress Notes (Signed)
CSW met with pt / son to assist with d/c planning. PT has recommended [facility] prior to pt returning to [facility] ( memory care unit ). Son has chosen [facility] for SNF placement. CSW will assist with d/c planning to SNF when pt is medically stable for d/c.  Werner Lean LCSW [phone]

## 2015-07-06 NOTE — Progress Notes (Signed)
Patient ID: Lindsey Hensley, female   DOB: [birth year], 81 y.o.   MRN: [id number] Subjective: 1 Day Post-Op Procedure(s) (LRB): CANNULATED HIP PINNING (Right)    Patient asleep in room, I purposefully did not wake her.  Family not in room  Objective:   VITALS:   Filed Vitals:   07/06/15 0200  BP: 107/50  Pulse: 92  Temp: 99 F (37.2 C)  Resp: 20    Incision: dressing C/D/I, right hip  LABS  Recent Labs  07/04/15 0435 07/05/15 0441 07/06/15 0455  HGB 9.4* 10.2* 9.0*  HCT 29.2* 32.1* 28.2*  WBC 16.6* 20.9* 25.1*  PLT 101* 102* 81*     Recent Labs  07/04/15 0435 07/05/15 0441 07/06/15 0455  NA 138 134* 136  K 3.4* 3.5 3.8  BUN CREATININE 1.12* 0.94 0.93  GLUCOSE 110* 105* 105*    No results for input(s): LABPT, INR in the last 72 hours.   Assessment/Plan: 1 Day Post-Op Procedure(s) (LRB): CANNULATED HIP PINNING (Right)   Advance diet Up with therapy Discharge to SNF when stable  RTC in 2 weeks PWB RLE

## 2015-07-07 ENCOUNTER — Encounter (HOSPITAL_COMMUNITY): Payer: Self-pay | Admitting: Orthopedic Surgery

## 2015-07-07 DIAGNOSIS — L8990 Pressure ulcer of unspecified site, unspecified stage: Secondary | ICD-10-CM | POA: Insufficient documentation

## 2015-07-07 LAB — COMPREHENSIVE METABOLIC PANEL
ALT: 13 U/L — ABNORMAL LOW (ref 14–54)
AST: 25 U/L (ref 15–41)
Albumin: 2.8 g/dL — ABNORMAL LOW (ref 3.5–5.0)
Alkaline Phosphatase: 56 U/L (ref 38–126)
Anion gap: 8 (ref 5–15)
BUN: 22 mg/dL — ABNORMAL HIGH (ref 6–20)
CO2: 27 mmol/L (ref 22–32)
Calcium: 8.4 mg/dL — ABNORMAL LOW (ref 8.9–10.3)
Chloride: 100 mmol/L — ABNORMAL LOW (ref 101–111)
Creatinine, Ser: 1.02 mg/dL — ABNORMAL HIGH (ref 0.44–1.00)
GFR calc Af Amer: 54 mL/min — ABNORMAL LOW (ref >60)
GFR calc non Af Amer: 47 mL/min — ABNORMAL LOW (ref >60)
Glucose, Bld: 110 mg/dL — ABNORMAL HIGH (ref 65–99)
Potassium: 3.9 mmol/L (ref 3.5–5.1)
Sodium: 135 mmol/L (ref 135–145)
Total Bilirubin: 0.8 mg/dL (ref 0.3–1.2)
Total Protein: 6.3 g/dL — ABNORMAL LOW (ref 6.5–8.1)

## 2015-07-07 LAB — CBC
HCT: 26.3 % — ABNORMAL LOW (ref 36.0–46.0)
Hemoglobin: 8.4 g/dL — ABNORMAL LOW (ref 12.0–15.0)
MCH: 28.6 pg (ref 26.0–34.0)
MCHC: 31.9 g/dL (ref 30.0–36.0)
MCV: 89.5 fL (ref 78.0–100.0)
Platelets: 89 10*3/uL — ABNORMAL LOW (ref 150–400)
RBC: 2.94 MIL/uL — ABNORMAL LOW (ref 3.87–5.11)
RDW: 14.7 % (ref 11.5–15.5)
WBC: 19.6 10*3/uL — ABNORMAL HIGH (ref 4.0–10.5)

## 2015-07-07 NOTE — Care Management Note (Signed)
Case Management Note  Patient Details  Name: AHILYN NELL MRN: [id number] Date of Birth: 06-01-1922  Subjective/Objective:                  Closed reduction and percutaneous cannulated screw fixation, right femoral neck fracture.  Action/Plan:  Discharge planning Expected Discharge Date:   (UNKNOWN)               Expected Discharge Plan:  Skilled Nursing Facility  In-House Referral:     Discharge planning Services  CM Consult  Post Acute Care Choice:    Choice offered to:     DME Arranged:    DME Agency:     HH Arranged:    HH Agency:     Status of Service:  Completed, signed off  Medicare Important Message Given:  Yes-second notification given Date Medicare IM Given:    Medicare IM give by:    Date Additional Medicare IM Given:    Additional Medicare Important Message give by:     If discussed at Long Length of Stay Meetings, dates discussed:    Additional Comments: CM notes pt to go to SNF; CSW arranging.  No other CM needs were communicated. Yves Dill, RN 07/07/2015, 2:08 PM

## 2015-07-07 NOTE — Progress Notes (Signed)
Patient ID: Lindsey Hensley, female   DOB: 1922-10-09, 81 y.o.   MRN: [id number] Subjective: 2 Days Post-Op Procedure(s) (LRB): CANNULATED HIP PINNING (Right)    Patient doing well Orthopaedically, no issues, awaiting placement.  Objective:   VITALS:   Filed Vitals:   07/07/15 0529  BP: 111/59  Pulse: 95  Temp: 98.3 F (36.8 C)  Resp: 17    Neurovascular intact Incision: dressing C/D/I, right hip  LABS  Recent Labs  07/05/15 0441 07/06/15 0455 07/07/15 0513  HGB 10.2* 9.0* 8.4*  HCT 32.1* 28.2* 26.3*  WBC 20.9* 25.1* 19.6*  PLT 102* 81* 89*     Recent Labs  07/05/15 0441 07/06/15 0455 07/07/15 0513  NA 134* 136 135  K 3.5 3.8 3.9  BUN 15 18 22*  CREATININE 0.94 0.93 1.02*  GLUCOSE 105* 105* 110*    No results for input(s): LABPT, INR in the last 72 hours.   Assessment/Plan: 2 Days Post-Op Procedure(s) (LRB): CANNULATED HIP PINNING (Right)   Up with therapy Discharge to SNF when appropriate RTC in 2 weeks Maintain surgical dressing until post-op visit in office

## 2015-07-07 NOTE — Progress Notes (Addendum)
PROGRESS NOTE  Lindsey Hensley:[id number] DOB: 15-Jan-1922 DOA: 07/03/2015 PCP: Martha Clan, MD   HPI: Lindsey Hensley is a 81 y.o. female relatively healthy for her age presents to the ED with a chief complaint of a fall, found to have a UTI and a hip fx.   Patient underwent surgery on 9/25 evening, she did well postop, however developed significant leukocytosis with white count up to 25K.  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   Acute on chronic renal failure   Nausea and vomiting   Thrombocytopenia   Iron deficiency anemia   Hypokalemia   Hypertension   UTI (urinary tract infection)   Pressure ulcer   Hip Fracture - Xray shoes nondisplaced subcapital R femoral neck fracture  - Orthopedic surgery consulted, s/p cannulated hip pinning 9/25  - pain control  Leukocytosis - patient with persistent leukocytosis up to 25K, may be reactive however is with significant elevation - is getting Ceftriaxone for UTI since admission, CXR with potential infiltrate, added Z pack for 3 days  - ROS unreliable given dementia - leukocytosis still with significant elevation however improving, 19K this morning. No diarrhea.   ABLA  - Hb trending down, 10/2 >> 9.0 >> 8.4, no need for transfusion now - Continue iron and monitor, keep Hb > 7  AKI on CKD - acute on chronic, stable - Monitor, IVF  UTI  - UA with evidence of UTI, given leukocytosis, low grade temp, will treat, started Ceftriaxone  - cultures negative however obtained after antibiotics were initiated.   Hypertension - No HTN at baseline, suspected cause is pain from fracture  - improved with pain control  Hypokalemia  - improved  Nausea/Vomiting:  - Per family, patient has had persistent N/V post meals for several year -  Patient does not appear malnourished  - SLP recommending regular diet   Thrombocytopenia - chronic, stable, no bleeding noted  Dementia (Alzhemier's)  - Demented at baseline, lives in memory care unit   History of CVA  - CVA in 2012, no focal neurological deficits at baseline    Diet: Diet regular Room service appropriate?: Yes; Fluid consistency:: Thin Fluids: D5NS DVT Prophylaxis: SCD  Code Status: DNR Family Communication: d/w son bedside  Disposition Plan: SNF 1 day pending WBC improvement   Consultants:  Orthopedic surgery   Procedures:  cannulated hip pinning 9/25    Antibiotics Ceftriaxone 9/23 >>   Studies  Dg Chest Port 1 View  07/06/2015   CLINICAL DATA:  Fever, leukocytosis, confusion  EXAM: PORTABLE CHEST 1 VIEW  COMPARISON:  Portable exam 0747 hours compared to 05/08/2015  FINDINGS: Upper normal heart size.  Calcified tortuous aorta.  Stable mediastinal contours.  Persistent RIGHT basilar atelectasis.  Increased consolidation versus atelectasis in LEFT lower lobe.  New mild diffuse interstitial prominence since previous study may be related to slight hypo inflation or potentially early infiltrate.  Tiny LEFT pleural effusion.  No pneumothorax.  Bones demineralized.  IMPRESSION: RIGHT basilar sub atelectasis persists.  New atelectasis versus consolidation LEFT lower lobe.  Increased interstitial prominence question related to hypo inflation nose mild edema or early infiltrate not excluded.   Electronically Signed   By: Ulyses Southward M.D.   On: 07/06/2015 08:27   Dg Abd Portable 1v  07/06/2015   CLINICAL DATA:  Abdominal pain, leukocytosis  EXAM: PORTABLE ABDOMEN - 1 VIEW  COMPARISON:  CT 12/13/2013  FINDINGS: Nonobstructive bowel gas pattern. No free air organomegaly. Scoliosis and degenerative changes in the thoracolumbar spine. Mild compression fracture at at T9.  IMPRESSION: No evidence of bowel obstruction or free air.   Electronically Signed   By: Charlett Nose  M.D.   On: 07/06/2015 08:54    Objective  Filed Vitals:   07/06/15 2115 07/06/15 2226 07/07/15 0529 07/07/15 1400  BP: 99/45  111/59 115/45  Pulse: 93  95 90  Temp: 98.7 F (37.1 C)  98.3 F (36.8 C) 99.3 F (37.4 C)  TempSrc: Oral  Oral   Resp: Height:      Weight:      SpO2: 95% 94% 91% 91%    Intake/Output Summary (Last 24 hours) at 07/07/15 1537 Last data filed at 07/07/15 1100  Gross per 24 hour  Intake 2343.33 ml  Output     50 ml  Net 2293.33 ml   Filed Weights   07/04/15 1541  Weight: 54.432 kg (120 lb)    Exam:  GENERAL: NAD  HEENT: head NCAT, no scleral icterus. Pupils round and reactive.   NECK: Supple.   LUNGS: Clear to auscultation. No wheezing or crackles  HEART: Regular rate and rhythm without murmur. 2+ pulses, no JVD, no peripheral edema  ABDOMEN: Soft, nontender, and nondistended. Positive bowel sounds.   NEUROLOGIC: non focal   Data Reviewed: Basic Metabolic Panel:  Recent Labs Lab 07/03/15 0649 07/04/15 0435 07/05/15 0441 07/06/15 0455 07/07/15 0513  NA 142 138 134* 136 135  K 3.2* 3.4* 3.5 3.8 3.9  CL 105 101 99* 103 100*  CO2 GLUCOSE 99 110* 105* 105* 110*  BUN 22*  CREATININE 1.01* 1.12* 0.94 0.93 1.02*  CALCIUM 9.3 8.6* 8.5* 8.3* 8.4*   CBC:  Recent Labs Lab 07/03/15 0649 07/04/15 0435 07/05/15 0441 07/06/15 0455 07/07/15 0513  WBC 12.8* 16.6* 20.9* 25.1* 19.6*  NEUTROABS 8.3*  --   --   --   --   HGB 10.9* 9.4* 10.2* 9.0* 8.4*  HCT 34.2* 29.2* 32.1* 28.2* 26.3*  MCV 88.4 89.6 89.7 89.8 89.5  PLT 123* 101* 102* 81* 89*    Scheduled Meds: . aspirin EC  325 mg Oral Q breakfast  . azithromycin  250 mg Oral Daily  . cefTRIAXone (ROCEPHIN)  IV  1 g Intravenous Q24H  . Chlorhexidine Gluconate Cloth  6 each Topical Daily  . docusate sodium  100 mg Oral BID  . iron polysaccharides  150 mg Oral Daily  . multivitamin with minerals  1 tablet Oral Daily  . mupirocin  ointment  1 application Nasal BID  . olopatadine  1 drop Both Eyes BID  . pantoprazole  40 mg Oral Daily  . polyethylene glycol  17 g Oral Daily  . sodium chloride  1 spray Each Nare BID  . sucralfate  1 g Oral TID WC & HS   Continuous Infusions: . dextrose 5 % and 0.45% NaCl Stopped (07/06/15 2025)  . sodium chloride 0.9 % 1,000 mL with potassium chloride 10 mEq infusion 50 mL/hr at 07/05/15 1745   Pamella Pert, MD [facility] Pager [phone]. If 7 PM - 7 AM, please contact night-coverage at [email or web address], password [facility] 07/07/2015, 3:37 PM  LOS: 4 days

## 2015-07-07 NOTE — Progress Notes (Signed)
Text sent to Dr Elvera Lennox re IV abx & lost iv access. Awaiting response. Jamonte Curfman, [employer]

## 2015-07-07 NOTE — Progress Notes (Signed)
New pressure sore noted to rt buttock below sacral dressing. Two small stage II of <1 cm in size. Peri-wound area red to purple, 12 x 8 cm. WOC paged. [place], [employer]

## 2015-07-08 DIAGNOSIS — R1311 Dysphagia, oral phase: Secondary | ICD-10-CM | POA: Diagnosis not present

## 2015-07-08 DIAGNOSIS — S72001A Fracture of unspecified part of neck of right femur, initial encounter for closed fracture: Secondary | ICD-10-CM | POA: Diagnosis not present

## 2015-07-08 DIAGNOSIS — S7224XS Nondisplaced subtrochanteric fracture of right femur, sequela: Secondary | ICD-10-CM | POA: Diagnosis not present

## 2015-07-08 DIAGNOSIS — R262 Difficulty in walking, not elsewhere classified: Secondary | ICD-10-CM | POA: Diagnosis not present

## 2015-07-08 DIAGNOSIS — N185 Chronic kidney disease, stage 5: Secondary | ICD-10-CM | POA: Diagnosis not present

## 2015-07-08 DIAGNOSIS — N390 Urinary tract infection, site not specified: Secondary | ICD-10-CM | POA: Diagnosis not present

## 2015-07-08 DIAGNOSIS — I6789 Other cerebrovascular disease: Secondary | ICD-10-CM | POA: Diagnosis not present

## 2015-07-08 DIAGNOSIS — M6281 Muscle weakness (generalized): Secondary | ICD-10-CM | POA: Diagnosis not present

## 2015-07-08 DIAGNOSIS — Z4789 Encounter for other orthopedic aftercare: Secondary | ICD-10-CM | POA: Diagnosis not present

## 2015-07-08 DIAGNOSIS — M84359A Stress fracture, hip, unspecified, initial encounter for fracture: Secondary | ICD-10-CM | POA: Diagnosis not present

## 2015-07-08 DIAGNOSIS — F0391 Unspecified dementia with behavioral disturbance: Secondary | ICD-10-CM | POA: Diagnosis not present

## 2015-07-08 DIAGNOSIS — R41841 Cognitive communication deficit: Secondary | ICD-10-CM | POA: Diagnosis not present

## 2015-07-08 DIAGNOSIS — M84359D Stress fracture, hip, unspecified, subsequent encounter for fracture with routine healing: Secondary | ICD-10-CM | POA: Diagnosis not present

## 2015-07-08 DIAGNOSIS — G309 Alzheimer's disease, unspecified: Secondary | ICD-10-CM | POA: Diagnosis not present

## 2015-07-08 DIAGNOSIS — I10 Essential (primary) hypertension: Secondary | ICD-10-CM | POA: Diagnosis not present

## 2015-07-08 DIAGNOSIS — N183 Chronic kidney disease, stage 3 (moderate): Secondary | ICD-10-CM | POA: Diagnosis not present

## 2015-07-08 DIAGNOSIS — M21259 Flexion deformity, unspecified hip: Secondary | ICD-10-CM | POA: Diagnosis not present

## 2015-07-08 DIAGNOSIS — M25552 Pain in left hip: Secondary | ICD-10-CM | POA: Diagnosis not present

## 2015-07-08 DIAGNOSIS — N179 Acute kidney failure, unspecified: Secondary | ICD-10-CM | POA: Diagnosis not present

## 2015-07-08 DIAGNOSIS — L89312 Pressure ulcer of right buttock, stage 2: Secondary | ICD-10-CM | POA: Diagnosis not present

## 2015-07-08 DIAGNOSIS — D649 Anemia, unspecified: Secondary | ICD-10-CM | POA: Diagnosis not present

## 2015-07-08 DIAGNOSIS — I639 Cerebral infarction, unspecified: Secondary | ICD-10-CM | POA: Diagnosis not present

## 2015-07-08 DIAGNOSIS — R269 Unspecified abnormalities of gait and mobility: Secondary | ICD-10-CM | POA: Diagnosis not present

## 2015-07-08 DIAGNOSIS — D696 Thrombocytopenia, unspecified: Secondary | ICD-10-CM | POA: Diagnosis not present

## 2015-07-08 DIAGNOSIS — K219 Gastro-esophageal reflux disease without esophagitis: Secondary | ICD-10-CM | POA: Diagnosis not present

## 2015-07-08 DIAGNOSIS — R488 Other symbolic dysfunctions: Secondary | ICD-10-CM | POA: Diagnosis not present

## 2015-07-08 LAB — BASIC METABOLIC PANEL
ANION GAP: 6 (ref 5–15)
BUN: 21 mg/dL — ABNORMAL HIGH (ref 6–20)
CALCIUM: 8.3 mg/dL — AB (ref 8.9–10.3)
CHLORIDE: 103 mmol/L (ref 101–111)
CO2: 27 mmol/L (ref 22–32)
Creatinine, Ser: 0.89 mg/dL (ref 0.44–1.00)
GFR calc non Af Amer: 55 mL/min — ABNORMAL LOW (ref >60)
Glucose, Bld: 109 mg/dL — ABNORMAL HIGH (ref 65–99)
Potassium: 3.8 mmol/L (ref 3.5–5.1)
SODIUM: 136 mmol/L (ref 135–145)

## 2015-07-08 LAB — CBC
HCT: 26.8 % — ABNORMAL LOW (ref 36.0–46.0)
HEMOGLOBIN: 8.6 g/dL — AB (ref 12.0–15.0)
MCH: 28.3 pg (ref 26.0–34.0)
MCHC: 32.1 g/dL (ref 30.0–36.0)
MCV: 88.2 fL (ref 78.0–100.0)
Platelets: 101 10*3/uL — ABNORMAL LOW (ref 150–400)
RBC: 3.04 MIL/uL — ABNORMAL LOW (ref 3.87–5.11)
RDW: 14.5 % (ref 11.5–15.5)
WBC: 15.9 10*3/uL — AB (ref 4.0–10.5)

## 2015-07-08 MED ORDER — HYDROCODONE-ACETAMINOPHEN 5-325 MG PO TABS: 1.0000 | ORAL_TABLET | Freq: Four times a day (QID) | ORAL | Status: AC | PRN

## 2015-07-08 MED ORDER — LEVOFLOXACIN 500 MG PO TABS: 500.0000 mg | ORAL_TABLET | Freq: Every day | ORAL | Status: AC

## 2015-07-08 MED ORDER — SACCHAROMYCES BOULARDII 250 MG PO CAPS: 250.0000 mg | ORAL_CAPSULE | Freq: Two times a day (BID) | ORAL | Status: AC

## 2015-07-08 NOTE — Progress Notes (Signed)
Medicare Important Message Given: Second Notification Given.

## 2015-07-08 NOTE — Clinical Social Work Placement (Addendum)
   CLINICAL SOCIAL WORK PLACEMENT  NOTE  Date:  07/08/2015  Patient Details  Name: Lindsey Hensley MRN: [id number] Date of Birth: 1921-12-26  Clinical Social Work is seeking post-discharge placement for this patient at the Skilled  Nursing Facility level of care (*CSW will initial, date and re-position this form in  chart as items are completed):  Yes   Patient/family provided with [place] Clinical Social Work Department's list of facilities offering this level of care within the geographic area requested by the patient (or if unable, by the patient's family).  Yes   Patient/family informed of their freedom to choose among providers that offer the needed level of care, that participate in Medicare, Medicaid or managed care program needed by the patient, have an available bed and are willing to accept the patient.  Yes   Patient/family informed of [facility]'s ownership interest in [facility] and [facility], as well as of the fact that they are under no obligation to receive care at these facilities.  PASRR submitted to EDS on       PASRR number received on       Existing PASRR number confirmed on 07/03/15     FL2 transmitted to all facilities in geographic area requested by pt/family on       FL2 transmitted to all facilities within larger geographic area on       Patient informed that his/her managed care company has contracts with or will negotiate with certain facilities, including the following:        Yes   Patient/family informed of bed offers received.  Patient chooses bed at [facility]     Physician recommends and patient chooses bed at      Patient to be transferred to [facility] on 07/08/15.  Patient to be transferred to facility by [facility]     Patient family notified on 07/08/15 of transfer.  Name of family member notified:  SON     PHYSICIAN       Additional Comment: Pt / son are in agreement with d/c to [facility] today. [facility] transport is  required. Son is aware out of pocket costs may be associated with [facility] transport. NSG reviewed d/c summary, scripts, avs. Scripts included in d/c packet. D/C Summary sent to SNF for review prior to d/c. [employer] provided authorization for rehab prior to d/c to SNF.   _______________________________________________ Royetta Asal, LCSW  [phone] 07/08/2015, 3:41 PM

## 2015-07-08 NOTE — Progress Notes (Signed)
Physical Therapy Treatment Patient Details Name: Lindsey Hensley MRN: [id number] DOB: 02/14/1922 Today's Date: 07/08/2015    History of Present Illness 81 yo female s/p R hip pinning per Ludger Nutting; PMHx: dementia, L hip surgery    PT Comments    POD # 3 am session.  Pt alert and pleasant.  Present with poor short term memory.  Pt did not know she fell and broke her hip.  Oriented to current situation and reassured her we were here to help.  Assisted OOB + 2 total Assist from bed to [facility].  Pt had a large BM.  Assisted with hygiene then positioned in recliner.  Pt was unable to stand due to cognition/fear/anxiety/pain.  So used "NIKE" stand pivot tech.  Positioned in recliner to comfort and applied ICE to R hip.   Follow Up Recommendations  SNF     Equipment Recommendations       Recommendations for Other Services       Precautions / Restrictions Precautions Precautions: Fall Precaution Comments: mild dementia prior at a memory care unit Restrictions Weight Bearing Restrictions: Yes RLE Weight Bearing: Partial weight bearing    Mobility  Bed Mobility Overal bed mobility: Needs Assistance             General bed mobility comments: repeat functional VC's and extra assist to complete task.  Used bed pad to complete scooting to EOB as pt repeated "help me lord". Extra time to be gentle to decrease fear/anxiety.  Transfers Overall transfer level: Needs assistance Equipment used: None Transfers: Sit to/from [employer] transfer comment: Pt was unable to perform sit to stand using a walker so, assisted from elevated bed to [facility] vis "Bear Hug" stand pivot 1/4 turn + 2 total Assist pt 10%.  Very fearful/increased anxiety.  Then assisted from [facility] to recliner same tech.  Pt was unable to support self and unable to take any pivot steps.  Rec nursing use a lift.   Ambulation/Gait             General Gait Details: unable at this time  due to low transfer performance   Stairs            Wheelchair Mobility    Modified Rankin (Stroke Patients Only)       Balance                                    Cognition Arousal/Alertness: Awake/alert Behavior During Therapy: WFL for tasks assessed/performed Overall Cognitive Status: History of cognitive impairments - at baseline (poor short term memory/follows commands)                      Exercises      General Comments        Pertinent Vitals/Pain Pain Assessment: Faces Faces Pain Scale: Hurts whole lot Pain Location: R hip Pain Descriptors / Indicators: Grimacing Pain Intervention(s): Monitored during session;Repositioned;Ice applied    Home Living                      Prior Function            PT Goals (current goals can now be found in the care plan section) Progress towards PT goals: Progressing toward goals    Frequency  Min 3X/week  PT Plan      Co-evaluation             End of Session Equipment Utilized During Treatment: Gait belt Activity Tolerance: Other (comment) (cognition/fear/anxiety/pain) Patient left: in chair;with call bell/phone within reach     Time: 1030-1055 PT Time Calculation (min) (ACUTE ONLY): 25 min  Charges:  $Therapeutic Activity: 23-37 mins                    G Codes:      Felecia Shelling  PTA [facility]  Acute  Rehab Pager      [phone]

## 2015-07-08 NOTE — Discharge Summary (Addendum)
Physician Discharge Summary  Lindsey Hensley ZOX:[id number] DOB: 1922/05/09 DOA: 07/03/2015  PCP: Martha Clan, MD  Admit date: 07/03/2015 Discharge date: 07/08/2015  Time spent:.35 minutes  Recommendations for Outpatient Follow-up:  1. Orthopedic surgery in 2 weeks. 2. We'll go to rehabilitation facility [facility].  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   Acute on chronic renal failure   Nausea and vomiting   Thrombocytopenia   Iron deficiency anemia   Hypokalemia   Hypertension   UTI (urinary tract infection)   Pressure ulcer   Discharge Condition: Stable   Filed Weights   07/04/15 1541  Weight: 54.432 kg (120 lb)    History of present illness:  81 year old female with past medical history of recurrent falls, the patient has advanced dementia so most of the history was obtained from family members they say they received a call 3 AM this morning the patient was found on the floor at the bathroom in the ED x-ray was done that showed right nondisplaced fracture to [facility] was consulted for preop evaluation and admission.   Hospital Course:  Right nondisplaced subcapital femoral fractures to falls: Orthopedic surgery was consulted who recommended closed reduction and percutaneous screw fixation to the right femoral neck performed on 07/05/2015. Pain seems to be well-controlled lower pole, orthopedic surgery recommended aspirin for DVT prophylaxis.  Leukocytosis: She was started on Rocephin for possible UTI and as his chest x-ray showed potential infiltrates azithromycin was added. Her leukocytosis was significantly high at 20 5K, once antibiotics were started leukocytosis improved. Her advanced dementia her history is unreliable. She will continue empiric treatment with antibiotics with Levaquin for 3 additional days for stone was added  for 30 days.  Acute blood loss anemia: There is no indication for transfusion at this point. Her hemoglobin dropped from 9.0-9.4 and from here on it remain stable.  Acute kidney injury on chronic kidney disease: Does resolve with IV fluid hydration.  Essential hypertension: No changes were made to her medication.  Nausea and vomiting: This resolved swallowing evaluation performed with a regular diet.  Mild Thrombocytopenia: Has been improving for the last 2 days is likely due to her hip fracture, no signs of bleeding.  History of CVA: Normal neurological deficits she's currently asymptomatic.    Procedures:  Status post fixation of the right femoral neck  Consultations:  Orthopedic surgery  Discharge Exam: Filed Vitals:   07/08/15 0516  BP: 141/60  Pulse: 83  Temp: 98.4 F (36.9 C)  Resp: 17    General: Awake alert and oriented 1 Cardiovascular: Regular rate and rhythm Respiratory: Good air movement clear to auscultation  Discharge Instructions   Discharge Instructions    Diet - low sodium heart healthy    Complete by:  As directed      Increase activity slowly    Complete by:  As directed      Partial weight bearing    Complete by:  As directed   50%          Current Discharge Medication List    START taking these medications   Details  aspirin EC 325 MG tablet Take 1 tablet (325 mg total) by mouth daily. Qty: 30 tablet, Refills: 0    !! HYDROcodone-acetaminophen (NORCO/VICODIN) 5-325 MG tablet Take 1-2 tablets by mouth every 6 (six) hours as needed for moderate pain. Qty: 30 tablet, Refills:  0    levofloxacin (LEVAQUIN) 500 MG tablet Take 1 tablet (500 mg total) by mouth daily. Qty: 3 tablet, Refills: 0    saccharomyces boulardii (FLORASTOR) 250 MG capsule Take 1 capsule (250 mg total) by mouth 2 (two) times daily. Qty: 30 capsule, Refills: 0    traMADol-acetaminophen (ULTRACET) 37.5-325 MG per tablet Take 1 tablet by mouth every 6 (six)  hours as needed. Qty: 60 tablet, Refills: 0     !! - Potential duplicate medications found. Please discuss with provider.    CONTINUE these medications which have NOT CHANGED   Details  iron polysaccharides (NIFEREX) 150 MG capsule Take 150 mg by mouth daily.    LORazepam (ATIVAN) 0.5 MG tablet Take 0.25 mg by mouth every 8 (eight) hours as needed for anxiety.    Multiple Vitamin (MULTIVITAMIN WITH MINERALS) TABS tablet Take 1 tablet by mouth daily.    Olopatadine HCl (PATADAY) 0.2 % SOLN Place 1 drop into both eyes daily.    omeprazole (PRILOSEC) 20 MG capsule Take 20 mg by mouth daily.    Polyethyl Glycol-Propyl Glycol (SYSTANE) 0.4-0.3 % SOLN Place 2 drops into both eyes daily as needed (dry eyes).    polyethylene glycol (MIRALAX / GLYCOLAX) packet Take 17 g by mouth daily.    sodium chloride (OCEAN) 0.65 % SOLN nasal spray Place 1 spray into both nostrils 2 (two) times daily.    sucralfate (CARAFATE) 1 G tablet Take 1 g by mouth 4 (four) times daily -  with meals and at bedtime.    Vitamin D, Ergocalciferol, (DRISDOL) 50000 UNITS CAPS capsule Take 50,000 Units by mouth every 7 (seven) days. Fridays    feeding supplement, ENSURE COMPLETE, (ENSURE COMPLETE) LIQD Take 237 mLs by mouth 2 (two) times daily between meals. Qty: 60 Bottle, Refills: 1    !! HYDROcodone-acetaminophen (NORCO) 5-325 MG per tablet Take 1-2 tablets by mouth every 6 (six) hours as needed for moderate pain. Qty: 20 tablet, Refills: 0    ondansetron (ZOFRAN) 4 MG/2ML SOLN injection Inject 2 mLs (4 mg total) into the vein every 6 (six) hours as needed for nausea or vomiting. Qty: 2 mL, Refills: 0    pantoprazole (PROTONIX) 40 MG tablet Take 1 tablet (40 mg total) by mouth daily. Qty: 30 tablet, Refills: 6    promethazine (PHENERGAN) 12.5 MG tablet Take 1 tablet (12.5 mg total) by mouth 3 (three) times daily before meals. Qty: 90 tablet, Refills: 1     !! - Potential duplicate medications found. Please  discuss with provider.    STOP taking these medications     acetaminophen (TYLENOL) 325 MG tablet      aspirin 325 MG tablet      metroNIDAZOLE (FLAGYL) 500 MG tablet        Allergies  Allergen Reactions  . Sulfa Antibiotics Other (See Comments)    unknown   Follow-up Information    Follow up with Shelda Pal, MD In 2 weeks.   Specialty:  Orthopedic Surgery   Why:  For wound check and X-rays   Contact information:   [address] [place] [address] [phone]        The results of significant diagnostics from this hospitalization (including imaging, microbiology, ancillary and laboratory) are listed below for reference.    Significant Diagnostic Studies: Dg Chest Port 1 View  07/06/2015   CLINICAL DATA:  Fever, leukocytosis, confusion  EXAM: PORTABLE CHEST 1 VIEW  COMPARISON:  Portable exam 0747 hours compared  to 05/08/2015  FINDINGS: Upper normal heart size.  Calcified tortuous aorta.  Stable mediastinal contours.  Persistent RIGHT basilar atelectasis.  Increased consolidation versus atelectasis in LEFT lower lobe.  New mild diffuse interstitial prominence since previous study may be related to slight hypo inflation or potentially early infiltrate.  Tiny LEFT pleural effusion.  No pneumothorax.  Bones demineralized.  IMPRESSION: RIGHT basilar sub atelectasis persists.  New atelectasis versus consolidation LEFT lower lobe.  Increased interstitial prominence question related to hypo inflation nose mild edema or early infiltrate not excluded.   Electronically Signed   By: Ulyses Southward M.D.   On: 07/06/2015 08:27   Dg Abd Portable 1v  07/06/2015   CLINICAL DATA:  Abdominal pain, leukocytosis  EXAM: PORTABLE ABDOMEN - 1 VIEW  COMPARISON:  CT 12/13/2013  FINDINGS: Nonobstructive bowel gas pattern. No free air organomegaly. Scoliosis and degenerative changes in the thoracolumbar spine. Mild compression fracture at at T9.  IMPRESSION: No evidence of bowel  obstruction or free air.   Electronically Signed   By: Charlett Nose M.D.   On: 07/06/2015 08:54   Dg C-arm 1-60 Min-no Report  07/05/2015   CLINICAL DATA: cannulated hip pinning right hip   C-ARM 1-60 MINUTES  Fluoroscopy was utilized by the requesting physician.  No radiographic  interpretation.    Dg Hip Operative Unilat With Pelvis Right  07/05/2015   CLINICAL DATA:  Patient with history of right hip fracture.  ORIF.  EXAM: DG C-ARM 1-60 MIN-NO REPORT; OPERATIVE RIGHT HIP WITH PELVIS  COMPARISON:  Hip radiograph 07/03/2015  FINDINGS: Multiple screws traverse the right femoral neck. Two intraoperative fluoroscopic images were presented. Fracture line appears in appropriate anatomic alignment.  IMPRESSION: Patient status post ORIF right femoral neck fracture.   Electronically Signed   By: Annia Belt M.D.   On: 07/05/2015 11:33   Dg Hip Unilat With Pelvis 2-3 Views Right  07/03/2015   CLINICAL DATA:  Patient fell in the bathroom. Patient is holding the right hip. Confusion.  EXAM: DG HIP (WITH OR WITHOUT PELVIS) 2-3V RIGHT  COMPARISON:  CT abdomen and pelvis 12/13/2013.  Abdomen 12/13/2013.  FINDINGS: Degenerative changes in the right hip. Prominent osteophytes on the femoral head. There appears to be acute angulation at the junction of the head and neck which suggests an impacted nondisplaced fracture of the subcapital region. Right hip hemiarthroplasty with cemented femoral component. Pelvis appears intact. SI joints and symphysis pubis are not displaced. Degenerative changes in the lower lumbar spine. Calcification in the pelvis probably representing a fibroid.  IMPRESSION: Impacted nondisplaced subcapital right femoral neck fracture. Degenerative changes in the right hip. Left hip arthroplasty.   Electronically Signed   By: Burman Nieves M.D.   On: 07/03/2015 06:24    Microbiology: Recent Results (from the past 240 hour(s))  Surgical pcr screen     Status: Abnormal   Collection Time: 07/04/15   8:53 AM  Result Value Ref Range Status   MRSA, PCR NEGATIVE NEGATIVE Final   Staphylococcus aureus POSITIVE (A) NEGATIVE Final    Comment:        The Xpert SA Assay (FDA approved for NASAL specimens in patients over 66 years of age), is one component of a comprehensive surveillance program.  Test performance has been validated by [facility] for patients greater than or equal to 62 year old. It is not intended to diagnose infection nor to guide or monitor treatment.   Culture, Urine     Status: None  Collection Time: 07/05/15  3:45 PM  Result Value Ref Range Status   Specimen Description URINE, CATHETERIZED  Final   Special Requests NONE  Final   Culture   Final    NO GROWTH 1 DAY Performed at [facility]    Report Status 07/06/2015 FINAL  Final     Labs: Basic Metabolic Panel:  Recent Labs Lab 07/04/15 0435 07/05/15 0441 07/06/15 0455 07/07/15 0513 07/08/15 0535  NA 138 134* 136 135 136  K 3.4* 3.5 3.8 3.9 3.8  CL 101 99* 103 100* 103  CO2 29 27 24 27 27   GLUCOSE 110* 105* 105* 110* 109*  BUN 16 15 18  22* 21*  CREATININE 1.12* 0.94 0.93 1.02* 0.89  CALCIUM 8.6* 8.5* 8.3* 8.4* 8.3*   Liver Function Tests:  Recent Labs Lab 07/07/15 0513  AST 25  ALT 13*  ALKPHOS 56  BILITOT 0.8  PROT 6.3*  ALBUMIN 2.8*   No results for input(s): LIPASE, AMYLASE in the last 168 hours. No results for input(s): AMMONIA in the last 168 hours. CBC:  Recent Labs Lab 07/03/15 0649 07/04/15 0435 07/05/15 0441 07/06/15 0455 07/07/15 0513 07/08/15 0535  WBC 12.8* 16.6* 20.9* 25.1* 19.6* 15.9*  NEUTROABS 8.3*  --   --   --   --   --   HGB 10.9* 9.4* 10.2* 9.0* 8.4* 8.6*  HCT 34.2* 29.2* 32.1* 28.2* 26.3* 26.8*  MCV 88.4 89.6 89.7 89.8 89.5 88.2  PLT 123* 101* 102* 81* 89* 101*   Cardiac Enzymes: No results for input(s): CKTOTAL, CKMB, CKMBINDEX, TROPONINI in the last 168 hours. BNP: BNP (last 3 results) No results for input(s): BNP in the last 8760  hours.  ProBNP (last 3 results) No results for input(s): PROBNP in the last 8760 hours.  CBG: No results for input(s): GLUCAP in the last 168 hours.     Signed:  Marinda Elk  [facility] 07/08/2015, 10:55 AM

## 2015-07-08 NOTE — Plan of Care (Signed)
Problem: Phase II Progression Outcomes Goal: Bed to chair Outcome: Completed/Met Date Met:  07/08/15 With assistance     

## 2015-07-08 NOTE — Progress Notes (Signed)
Attempted report x3 to [facility] Nurse. Nurse not available for report. Callback number given to staff member at facility with no return call noted. Family called and updated that pt has left with [facility]AR to facility.

## 2015-07-08 NOTE — Progress Notes (Signed)
Physical Therapy Treatment Patient Details Name: MOMOKO SLEZAK MRN: [id number] DOB: [birth year] Today's Date: 07/08/2015    History of Present Illness 81 yo female s/p R hip pinning per Ludger Nutting; PMHx: dementia, L hip surgery    PT Comments    Assisted pt out of recliner to [facility] (pt incont urine) then assisted back to bed.    Follow Up Recommendations        Equipment Recommendations       Recommendations for Other Services       Precautions / Restrictions Precautions Precautions: Fall Precaution Comments: mild dementia prior at a memory care unit Restrictions Weight Bearing Restrictions: Yes RLE Weight Bearing: Partial weight bearing    Mobility  Bed Mobility Overal bed mobility: Needs Assistance             General bed mobility comments: assisted back to bed + 2 assist and increased time to position to comfort  Transfers Overall transfer level: Needs assistance Equipment used: None Transfers: Sit to/from Stand;Stand Pivot Transfers           General transfer comment: assisted from recliner to [facility] then from [facility] to bed vis "bear Hug" as pt demonstrated increased anxiety/fear repeating "Lord help me".  Pt was unable to support self fully and unable to take any pivot steps.   Ambulation/Gait             General Gait Details: unable at this time due to low transfer performance   Stairs            Wheelchair Mobility    Modified Rankin (Stroke Patients Only)       Balance                                    Cognition Arousal/Alertness: Awake/alert Behavior During Therapy: WFL for tasks assessed/performed Overall Cognitive Status: History of cognitive impairments - at baseline                      Exercises      General Comments        Pertinent Vitals/Pain Pain Assessment: Faces Faces Pain Scale: Hurts little more Pain Location: R hip Pain Descriptors / Indicators: Grimacing Pain Intervention(s):  Monitored during session;Repositioned    Home Living                      Prior Function            PT Goals (current goals can now be found in the care plan section)      Frequency       PT Plan      Co-evaluation             End of Session           Time: 14:20 - 14:45  PT Time Calculation (min) (ACUTE ONLY): 25 min  Charges:  $Therapeutic Activity: 23-37 mins                    G Codes:      Felecia Shelling  PTA [facility]  Acute  Rehab Pager      [phone]

## 2015-07-08 NOTE — Progress Notes (Signed)
Assisting with d/c planning to SNF. Pt has a bed at [facility] today. [employer] medicare authorization has been requested.  Cori Razor LCSW [phone]

## 2015-07-09 DIAGNOSIS — N390 Urinary tract infection, site not specified: Secondary | ICD-10-CM | POA: Diagnosis not present

## 2015-07-09 DIAGNOSIS — S72001A Fracture of unspecified part of neck of right femur, initial encounter for closed fracture: Secondary | ICD-10-CM | POA: Diagnosis not present

## 2015-07-09 DIAGNOSIS — I10 Essential (primary) hypertension: Secondary | ICD-10-CM | POA: Diagnosis not present

## 2015-07-09 DIAGNOSIS — F0391 Unspecified dementia with behavioral disturbance: Secondary | ICD-10-CM | POA: Diagnosis not present

## 2015-07-09 DIAGNOSIS — D696 Thrombocytopenia, unspecified: Secondary | ICD-10-CM | POA: Diagnosis not present

## 2015-07-09 DIAGNOSIS — I639 Cerebral infarction, unspecified: Secondary | ICD-10-CM | POA: Diagnosis not present

## 2015-07-09 DIAGNOSIS — N185 Chronic kidney disease, stage 5: Secondary | ICD-10-CM | POA: Diagnosis not present

## 2015-07-09 DIAGNOSIS — L89312 Pressure ulcer of right buttock, stage 2: Secondary | ICD-10-CM | POA: Diagnosis not present

## 2015-07-23 DIAGNOSIS — D696 Thrombocytopenia, unspecified: Secondary | ICD-10-CM | POA: Diagnosis not present

## 2015-07-23 DIAGNOSIS — F0391 Unspecified dementia with behavioral disturbance: Secondary | ICD-10-CM | POA: Diagnosis not present

## 2015-07-23 DIAGNOSIS — Z4789 Encounter for other orthopedic aftercare: Secondary | ICD-10-CM | POA: Diagnosis not present

## 2015-07-23 DIAGNOSIS — I10 Essential (primary) hypertension: Secondary | ICD-10-CM | POA: Diagnosis not present

## 2015-07-23 DIAGNOSIS — S72001A Fracture of unspecified part of neck of right femur, initial encounter for closed fracture: Secondary | ICD-10-CM | POA: Diagnosis not present

## 2015-07-23 DIAGNOSIS — N183 Chronic kidney disease, stage 3 (moderate): Secondary | ICD-10-CM | POA: Diagnosis not present

## 2015-08-04 DIAGNOSIS — K219 Gastro-esophageal reflux disease without esophagitis: Secondary | ICD-10-CM | POA: Diagnosis not present

## 2015-08-04 DIAGNOSIS — M25552 Pain in left hip: Secondary | ICD-10-CM | POA: Diagnosis not present

## 2015-08-11 DIAGNOSIS — S81812D Laceration without foreign body, left lower leg, subsequent encounter: Secondary | ICD-10-CM | POA: Diagnosis not present

## 2015-08-11 DIAGNOSIS — S72001D Fracture of unspecified part of neck of right femur, subsequent encounter for closed fracture with routine healing: Secondary | ICD-10-CM | POA: Diagnosis not present

## 2015-08-11 DIAGNOSIS — E559 Vitamin D deficiency, unspecified: Secondary | ICD-10-CM | POA: Diagnosis not present

## 2015-08-11 DIAGNOSIS — M810 Age-related osteoporosis without current pathological fracture: Secondary | ICD-10-CM | POA: Diagnosis not present

## 2015-08-11 DIAGNOSIS — I10 Essential (primary) hypertension: Secondary | ICD-10-CM | POA: Diagnosis not present

## 2015-08-11 DIAGNOSIS — R112 Nausea with vomiting, unspecified: Secondary | ICD-10-CM | POA: Diagnosis not present

## 2015-08-11 DIAGNOSIS — R040 Epistaxis: Secondary | ICD-10-CM | POA: Diagnosis not present

## 2015-08-11 DIAGNOSIS — G309 Alzheimer's disease, unspecified: Secondary | ICD-10-CM | POA: Diagnosis not present

## 2015-08-11 DIAGNOSIS — F0280 Dementia in other diseases classified elsewhere without behavioral disturbance: Secondary | ICD-10-CM | POA: Diagnosis not present

## 2015-08-11 DIAGNOSIS — K5901 Slow transit constipation: Secondary | ICD-10-CM | POA: Diagnosis not present

## 2015-08-11 DIAGNOSIS — H1031 Unspecified acute conjunctivitis, right eye: Secondary | ICD-10-CM | POA: Diagnosis not present

## 2015-08-11 DIAGNOSIS — K219 Gastro-esophageal reflux disease without esophagitis: Secondary | ICD-10-CM | POA: Diagnosis not present

## 2015-08-11 DIAGNOSIS — I639 Cerebral infarction, unspecified: Secondary | ICD-10-CM | POA: Diagnosis not present

## 2015-08-11 DIAGNOSIS — D649 Anemia, unspecified: Secondary | ICD-10-CM | POA: Diagnosis not present

## 2015-08-13 DIAGNOSIS — E559 Vitamin D deficiency, unspecified: Secondary | ICD-10-CM | POA: Diagnosis not present

## 2015-08-13 DIAGNOSIS — D649 Anemia, unspecified: Secondary | ICD-10-CM | POA: Diagnosis not present

## 2015-08-13 DIAGNOSIS — R040 Epistaxis: Secondary | ICD-10-CM | POA: Diagnosis not present

## 2015-08-13 DIAGNOSIS — R112 Nausea with vomiting, unspecified: Secondary | ICD-10-CM | POA: Diagnosis not present

## 2015-08-13 DIAGNOSIS — K219 Gastro-esophageal reflux disease without esophagitis: Secondary | ICD-10-CM | POA: Diagnosis not present

## 2015-08-13 DIAGNOSIS — M810 Age-related osteoporosis without current pathological fracture: Secondary | ICD-10-CM | POA: Diagnosis not present

## 2015-08-13 DIAGNOSIS — I639 Cerebral infarction, unspecified: Secondary | ICD-10-CM | POA: Diagnosis not present

## 2015-08-13 DIAGNOSIS — H1031 Unspecified acute conjunctivitis, right eye: Secondary | ICD-10-CM | POA: Diagnosis not present

## 2015-08-13 DIAGNOSIS — K5901 Slow transit constipation: Secondary | ICD-10-CM | POA: Diagnosis not present

## 2015-08-15 DIAGNOSIS — M810 Age-related osteoporosis without current pathological fracture: Secondary | ICD-10-CM | POA: Diagnosis not present

## 2015-08-15 DIAGNOSIS — I10 Essential (primary) hypertension: Secondary | ICD-10-CM | POA: Diagnosis not present

## 2015-08-15 DIAGNOSIS — S72001D Fracture of unspecified part of neck of right femur, subsequent encounter for closed fracture with routine healing: Secondary | ICD-10-CM | POA: Diagnosis not present

## 2015-08-15 DIAGNOSIS — F0280 Dementia in other diseases classified elsewhere without behavioral disturbance: Secondary | ICD-10-CM | POA: Diagnosis not present

## 2015-08-15 DIAGNOSIS — G309 Alzheimer's disease, unspecified: Secondary | ICD-10-CM | POA: Diagnosis not present

## 2015-08-15 DIAGNOSIS — S81812D Laceration without foreign body, left lower leg, subsequent encounter: Secondary | ICD-10-CM | POA: Diagnosis not present

## 2015-08-17 DIAGNOSIS — S81812D Laceration without foreign body, left lower leg, subsequent encounter: Secondary | ICD-10-CM | POA: Diagnosis not present

## 2015-08-17 DIAGNOSIS — G309 Alzheimer's disease, unspecified: Secondary | ICD-10-CM | POA: Diagnosis not present

## 2015-08-17 DIAGNOSIS — S72001D Fracture of unspecified part of neck of right femur, subsequent encounter for closed fracture with routine healing: Secondary | ICD-10-CM | POA: Diagnosis not present

## 2015-08-17 DIAGNOSIS — M810 Age-related osteoporosis without current pathological fracture: Secondary | ICD-10-CM | POA: Diagnosis not present

## 2015-08-17 DIAGNOSIS — F0280 Dementia in other diseases classified elsewhere without behavioral disturbance: Secondary | ICD-10-CM | POA: Diagnosis not present

## 2015-08-17 DIAGNOSIS — I10 Essential (primary) hypertension: Secondary | ICD-10-CM | POA: Diagnosis not present

## 2015-08-18 DIAGNOSIS — F0280 Dementia in other diseases classified elsewhere without behavioral disturbance: Secondary | ICD-10-CM | POA: Diagnosis not present

## 2015-08-18 DIAGNOSIS — G309 Alzheimer's disease, unspecified: Secondary | ICD-10-CM | POA: Diagnosis not present

## 2015-08-18 DIAGNOSIS — I10 Essential (primary) hypertension: Secondary | ICD-10-CM | POA: Diagnosis not present

## 2015-08-18 DIAGNOSIS — F0150 Vascular dementia without behavioral disturbance: Secondary | ICD-10-CM | POA: Diagnosis not present

## 2015-08-18 DIAGNOSIS — M810 Age-related osteoporosis without current pathological fracture: Secondary | ICD-10-CM | POA: Diagnosis not present

## 2015-08-18 DIAGNOSIS — S72001D Fracture of unspecified part of neck of right femur, subsequent encounter for closed fracture with routine healing: Secondary | ICD-10-CM | POA: Diagnosis not present

## 2015-08-18 DIAGNOSIS — S81812D Laceration without foreign body, left lower leg, subsequent encounter: Secondary | ICD-10-CM | POA: Diagnosis not present

## 2015-08-20 DIAGNOSIS — F0280 Dementia in other diseases classified elsewhere without behavioral disturbance: Secondary | ICD-10-CM | POA: Diagnosis not present

## 2015-08-20 DIAGNOSIS — M810 Age-related osteoporosis without current pathological fracture: Secondary | ICD-10-CM | POA: Diagnosis not present

## 2015-08-20 DIAGNOSIS — S72001D Fracture of unspecified part of neck of right femur, subsequent encounter for closed fracture with routine healing: Secondary | ICD-10-CM | POA: Diagnosis not present

## 2015-08-20 DIAGNOSIS — S81812D Laceration without foreign body, left lower leg, subsequent encounter: Secondary | ICD-10-CM | POA: Diagnosis not present

## 2015-08-20 DIAGNOSIS — G309 Alzheimer's disease, unspecified: Secondary | ICD-10-CM | POA: Diagnosis not present

## 2015-08-20 DIAGNOSIS — I10 Essential (primary) hypertension: Secondary | ICD-10-CM | POA: Diagnosis not present

## 2015-08-24 DIAGNOSIS — S81812D Laceration without foreign body, left lower leg, subsequent encounter: Secondary | ICD-10-CM | POA: Diagnosis not present

## 2015-08-24 DIAGNOSIS — F0280 Dementia in other diseases classified elsewhere without behavioral disturbance: Secondary | ICD-10-CM | POA: Diagnosis not present

## 2015-08-24 DIAGNOSIS — S72001D Fracture of unspecified part of neck of right femur, subsequent encounter for closed fracture with routine healing: Secondary | ICD-10-CM | POA: Diagnosis not present

## 2015-08-24 DIAGNOSIS — I10 Essential (primary) hypertension: Secondary | ICD-10-CM | POA: Diagnosis not present

## 2015-08-24 DIAGNOSIS — M810 Age-related osteoporosis without current pathological fracture: Secondary | ICD-10-CM | POA: Diagnosis not present

## 2015-08-24 DIAGNOSIS — G309 Alzheimer's disease, unspecified: Secondary | ICD-10-CM | POA: Diagnosis not present

## 2015-08-26 DIAGNOSIS — F0280 Dementia in other diseases classified elsewhere without behavioral disturbance: Secondary | ICD-10-CM | POA: Diagnosis not present

## 2015-08-26 DIAGNOSIS — I10 Essential (primary) hypertension: Secondary | ICD-10-CM | POA: Diagnosis not present

## 2015-08-26 DIAGNOSIS — M810 Age-related osteoporosis without current pathological fracture: Secondary | ICD-10-CM | POA: Diagnosis not present

## 2015-08-26 DIAGNOSIS — S72001D Fracture of unspecified part of neck of right femur, subsequent encounter for closed fracture with routine healing: Secondary | ICD-10-CM | POA: Diagnosis not present

## 2015-08-26 DIAGNOSIS — G309 Alzheimer's disease, unspecified: Secondary | ICD-10-CM | POA: Diagnosis not present

## 2015-08-26 DIAGNOSIS — S81812D Laceration without foreign body, left lower leg, subsequent encounter: Secondary | ICD-10-CM | POA: Diagnosis not present

## 2015-08-27 DIAGNOSIS — G309 Alzheimer's disease, unspecified: Secondary | ICD-10-CM | POA: Diagnosis not present

## 2015-08-27 DIAGNOSIS — F0280 Dementia in other diseases classified elsewhere without behavioral disturbance: Secondary | ICD-10-CM | POA: Diagnosis not present

## 2015-08-27 DIAGNOSIS — I10 Essential (primary) hypertension: Secondary | ICD-10-CM | POA: Diagnosis not present

## 2015-08-27 DIAGNOSIS — S81812D Laceration without foreign body, left lower leg, subsequent encounter: Secondary | ICD-10-CM | POA: Diagnosis not present

## 2015-08-27 DIAGNOSIS — M810 Age-related osteoporosis without current pathological fracture: Secondary | ICD-10-CM | POA: Diagnosis not present

## 2015-08-27 DIAGNOSIS — S72001D Fracture of unspecified part of neck of right femur, subsequent encounter for closed fracture with routine healing: Secondary | ICD-10-CM | POA: Diagnosis not present

## 2015-08-31 DIAGNOSIS — M810 Age-related osteoporosis without current pathological fracture: Secondary | ICD-10-CM | POA: Diagnosis not present

## 2015-08-31 DIAGNOSIS — S81812D Laceration without foreign body, left lower leg, subsequent encounter: Secondary | ICD-10-CM | POA: Diagnosis not present

## 2015-08-31 DIAGNOSIS — S72001D Fracture of unspecified part of neck of right femur, subsequent encounter for closed fracture with routine healing: Secondary | ICD-10-CM | POA: Diagnosis not present

## 2015-08-31 DIAGNOSIS — I10 Essential (primary) hypertension: Secondary | ICD-10-CM | POA: Diagnosis not present

## 2015-08-31 DIAGNOSIS — I6789 Other cerebrovascular disease: Secondary | ICD-10-CM | POA: Diagnosis not present

## 2015-08-31 DIAGNOSIS — F0280 Dementia in other diseases classified elsewhere without behavioral disturbance: Secondary | ICD-10-CM | POA: Diagnosis not present

## 2015-08-31 DIAGNOSIS — S7224XS Nondisplaced subtrochanteric fracture of right femur, sequela: Secondary | ICD-10-CM | POA: Diagnosis not present

## 2015-08-31 DIAGNOSIS — M6281 Muscle weakness (generalized): Secondary | ICD-10-CM | POA: Diagnosis not present

## 2015-08-31 DIAGNOSIS — R269 Unspecified abnormalities of gait and mobility: Secondary | ICD-10-CM | POA: Diagnosis not present

## 2015-08-31 DIAGNOSIS — G309 Alzheimer's disease, unspecified: Secondary | ICD-10-CM | POA: Diagnosis not present

## 2015-09-01 DIAGNOSIS — I10 Essential (primary) hypertension: Secondary | ICD-10-CM | POA: Diagnosis not present

## 2015-09-01 DIAGNOSIS — S72001D Fracture of unspecified part of neck of right femur, subsequent encounter for closed fracture with routine healing: Secondary | ICD-10-CM | POA: Diagnosis not present

## 2015-09-01 DIAGNOSIS — S81812D Laceration without foreign body, left lower leg, subsequent encounter: Secondary | ICD-10-CM | POA: Diagnosis not present

## 2015-09-01 DIAGNOSIS — G309 Alzheimer's disease, unspecified: Secondary | ICD-10-CM | POA: Diagnosis not present

## 2015-09-01 DIAGNOSIS — G894 Chronic pain syndrome: Secondary | ICD-10-CM | POA: Diagnosis not present

## 2015-09-01 DIAGNOSIS — F0280 Dementia in other diseases classified elsewhere without behavioral disturbance: Secondary | ICD-10-CM | POA: Diagnosis not present

## 2015-09-01 DIAGNOSIS — M810 Age-related osteoporosis without current pathological fracture: Secondary | ICD-10-CM | POA: Diagnosis not present

## 2015-09-02 DIAGNOSIS — S81812D Laceration without foreign body, left lower leg, subsequent encounter: Secondary | ICD-10-CM | POA: Diagnosis not present

## 2015-09-02 DIAGNOSIS — I10 Essential (primary) hypertension: Secondary | ICD-10-CM | POA: Diagnosis not present

## 2015-09-02 DIAGNOSIS — F0280 Dementia in other diseases classified elsewhere without behavioral disturbance: Secondary | ICD-10-CM | POA: Diagnosis not present

## 2015-09-02 DIAGNOSIS — G309 Alzheimer's disease, unspecified: Secondary | ICD-10-CM | POA: Diagnosis not present

## 2015-09-02 DIAGNOSIS — M810 Age-related osteoporosis without current pathological fracture: Secondary | ICD-10-CM | POA: Diagnosis not present

## 2015-09-02 DIAGNOSIS — S72001D Fracture of unspecified part of neck of right femur, subsequent encounter for closed fracture with routine healing: Secondary | ICD-10-CM | POA: Diagnosis not present

## 2015-09-03 DIAGNOSIS — F0280 Dementia in other diseases classified elsewhere without behavioral disturbance: Secondary | ICD-10-CM | POA: Diagnosis not present

## 2015-09-03 DIAGNOSIS — S72001D Fracture of unspecified part of neck of right femur, subsequent encounter for closed fracture with routine healing: Secondary | ICD-10-CM | POA: Diagnosis not present

## 2015-09-03 DIAGNOSIS — G309 Alzheimer's disease, unspecified: Secondary | ICD-10-CM | POA: Diagnosis not present

## 2015-09-03 DIAGNOSIS — I10 Essential (primary) hypertension: Secondary | ICD-10-CM | POA: Diagnosis not present

## 2015-09-03 DIAGNOSIS — M810 Age-related osteoporosis without current pathological fracture: Secondary | ICD-10-CM | POA: Diagnosis not present

## 2015-09-03 DIAGNOSIS — S81812D Laceration without foreign body, left lower leg, subsequent encounter: Secondary | ICD-10-CM | POA: Diagnosis not present

## 2015-09-07 DIAGNOSIS — S81812D Laceration without foreign body, left lower leg, subsequent encounter: Secondary | ICD-10-CM | POA: Diagnosis not present

## 2015-09-07 DIAGNOSIS — I10 Essential (primary) hypertension: Secondary | ICD-10-CM | POA: Diagnosis not present

## 2015-09-07 DIAGNOSIS — F0280 Dementia in other diseases classified elsewhere without behavioral disturbance: Secondary | ICD-10-CM | POA: Diagnosis not present

## 2015-09-07 DIAGNOSIS — G309 Alzheimer's disease, unspecified: Secondary | ICD-10-CM | POA: Diagnosis not present

## 2015-09-07 DIAGNOSIS — S72001D Fracture of unspecified part of neck of right femur, subsequent encounter for closed fracture with routine healing: Secondary | ICD-10-CM | POA: Diagnosis not present

## 2015-09-07 DIAGNOSIS — M810 Age-related osteoporosis without current pathological fracture: Secondary | ICD-10-CM | POA: Diagnosis not present

## 2015-09-08 DIAGNOSIS — M810 Age-related osteoporosis without current pathological fracture: Secondary | ICD-10-CM | POA: Diagnosis not present

## 2015-09-08 DIAGNOSIS — S72001D Fracture of unspecified part of neck of right femur, subsequent encounter for closed fracture with routine healing: Secondary | ICD-10-CM | POA: Diagnosis not present

## 2015-09-08 DIAGNOSIS — F0280 Dementia in other diseases classified elsewhere without behavioral disturbance: Secondary | ICD-10-CM | POA: Diagnosis not present

## 2015-09-08 DIAGNOSIS — I10 Essential (primary) hypertension: Secondary | ICD-10-CM | POA: Diagnosis not present

## 2015-09-08 DIAGNOSIS — G309 Alzheimer's disease, unspecified: Secondary | ICD-10-CM | POA: Diagnosis not present

## 2015-09-08 DIAGNOSIS — S81812D Laceration without foreign body, left lower leg, subsequent encounter: Secondary | ICD-10-CM | POA: Diagnosis not present

## 2015-09-09 DIAGNOSIS — I10 Essential (primary) hypertension: Secondary | ICD-10-CM | POA: Diagnosis not present

## 2015-09-09 DIAGNOSIS — S81812D Laceration without foreign body, left lower leg, subsequent encounter: Secondary | ICD-10-CM | POA: Diagnosis not present

## 2015-09-09 DIAGNOSIS — E039 Hypothyroidism, unspecified: Secondary | ICD-10-CM | POA: Diagnosis not present

## 2015-09-09 DIAGNOSIS — M810 Age-related osteoporosis without current pathological fracture: Secondary | ICD-10-CM | POA: Diagnosis not present

## 2015-09-09 DIAGNOSIS — E782 Mixed hyperlipidemia: Secondary | ICD-10-CM | POA: Diagnosis not present

## 2015-09-09 DIAGNOSIS — G309 Alzheimer's disease, unspecified: Secondary | ICD-10-CM | POA: Diagnosis not present

## 2015-09-09 DIAGNOSIS — S72001D Fracture of unspecified part of neck of right femur, subsequent encounter for closed fracture with routine healing: Secondary | ICD-10-CM | POA: Diagnosis not present

## 2015-09-09 DIAGNOSIS — F0280 Dementia in other diseases classified elsewhere without behavioral disturbance: Secondary | ICD-10-CM | POA: Diagnosis not present

## 2015-09-10 DIAGNOSIS — M810 Age-related osteoporosis without current pathological fracture: Secondary | ICD-10-CM | POA: Diagnosis not present

## 2015-09-10 DIAGNOSIS — S72001D Fracture of unspecified part of neck of right femur, subsequent encounter for closed fracture with routine healing: Secondary | ICD-10-CM | POA: Diagnosis not present

## 2015-09-10 DIAGNOSIS — S81812D Laceration without foreign body, left lower leg, subsequent encounter: Secondary | ICD-10-CM | POA: Diagnosis not present

## 2015-09-10 DIAGNOSIS — F0280 Dementia in other diseases classified elsewhere without behavioral disturbance: Secondary | ICD-10-CM | POA: Diagnosis not present

## 2015-09-10 DIAGNOSIS — I10 Essential (primary) hypertension: Secondary | ICD-10-CM | POA: Diagnosis not present

## 2015-09-10 DIAGNOSIS — G309 Alzheimer's disease, unspecified: Secondary | ICD-10-CM | POA: Diagnosis not present

## 2015-09-11 DIAGNOSIS — M79671 Pain in right foot: Secondary | ICD-10-CM | POA: Diagnosis not present

## 2015-09-11 DIAGNOSIS — B351 Tinea unguium: Secondary | ICD-10-CM | POA: Diagnosis not present

## 2015-09-11 DIAGNOSIS — M79672 Pain in left foot: Secondary | ICD-10-CM | POA: Diagnosis not present

## 2015-09-11 DIAGNOSIS — Q845 Enlarged and hypertrophic nails: Secondary | ICD-10-CM | POA: Diagnosis not present

## 2015-09-11 DIAGNOSIS — L603 Nail dystrophy: Secondary | ICD-10-CM | POA: Diagnosis not present

## 2015-09-14 DIAGNOSIS — S72001D Fracture of unspecified part of neck of right femur, subsequent encounter for closed fracture with routine healing: Secondary | ICD-10-CM | POA: Diagnosis not present

## 2015-09-14 DIAGNOSIS — M810 Age-related osteoporosis without current pathological fracture: Secondary | ICD-10-CM | POA: Diagnosis not present

## 2015-09-14 DIAGNOSIS — F0280 Dementia in other diseases classified elsewhere without behavioral disturbance: Secondary | ICD-10-CM | POA: Diagnosis not present

## 2015-09-14 DIAGNOSIS — G309 Alzheimer's disease, unspecified: Secondary | ICD-10-CM | POA: Diagnosis not present

## 2015-09-14 DIAGNOSIS — I10 Essential (primary) hypertension: Secondary | ICD-10-CM | POA: Diagnosis not present

## 2015-09-14 DIAGNOSIS — S81812D Laceration without foreign body, left lower leg, subsequent encounter: Secondary | ICD-10-CM | POA: Diagnosis not present

## 2015-09-15 DIAGNOSIS — G309 Alzheimer's disease, unspecified: Secondary | ICD-10-CM | POA: Diagnosis not present

## 2015-09-15 DIAGNOSIS — S81812D Laceration without foreign body, left lower leg, subsequent encounter: Secondary | ICD-10-CM | POA: Diagnosis not present

## 2015-09-15 DIAGNOSIS — S72001D Fracture of unspecified part of neck of right femur, subsequent encounter for closed fracture with routine healing: Secondary | ICD-10-CM | POA: Diagnosis not present

## 2015-09-15 DIAGNOSIS — I10 Essential (primary) hypertension: Secondary | ICD-10-CM | POA: Diagnosis not present

## 2015-09-15 DIAGNOSIS — M810 Age-related osteoporosis without current pathological fracture: Secondary | ICD-10-CM | POA: Diagnosis not present

## 2015-09-15 DIAGNOSIS — F0280 Dementia in other diseases classified elsewhere without behavioral disturbance: Secondary | ICD-10-CM | POA: Diagnosis not present

## 2015-09-16 DIAGNOSIS — M810 Age-related osteoporosis without current pathological fracture: Secondary | ICD-10-CM | POA: Diagnosis not present

## 2015-09-16 DIAGNOSIS — G309 Alzheimer's disease, unspecified: Secondary | ICD-10-CM | POA: Diagnosis not present

## 2015-09-16 DIAGNOSIS — F0280 Dementia in other diseases classified elsewhere without behavioral disturbance: Secondary | ICD-10-CM | POA: Diagnosis not present

## 2015-09-16 DIAGNOSIS — S72001D Fracture of unspecified part of neck of right femur, subsequent encounter for closed fracture with routine healing: Secondary | ICD-10-CM | POA: Diagnosis not present

## 2015-09-16 DIAGNOSIS — S81812D Laceration without foreign body, left lower leg, subsequent encounter: Secondary | ICD-10-CM | POA: Diagnosis not present

## 2015-09-16 DIAGNOSIS — I10 Essential (primary) hypertension: Secondary | ICD-10-CM | POA: Diagnosis not present

## 2015-09-21 DIAGNOSIS — G309 Alzheimer's disease, unspecified: Secondary | ICD-10-CM | POA: Diagnosis not present

## 2015-09-21 DIAGNOSIS — M810 Age-related osteoporosis without current pathological fracture: Secondary | ICD-10-CM | POA: Diagnosis not present

## 2015-09-21 DIAGNOSIS — S72001D Fracture of unspecified part of neck of right femur, subsequent encounter for closed fracture with routine healing: Secondary | ICD-10-CM | POA: Diagnosis not present

## 2015-09-21 DIAGNOSIS — S81812D Laceration without foreign body, left lower leg, subsequent encounter: Secondary | ICD-10-CM | POA: Diagnosis not present

## 2015-09-21 DIAGNOSIS — I10 Essential (primary) hypertension: Secondary | ICD-10-CM | POA: Diagnosis not present

## 2015-09-21 DIAGNOSIS — F0280 Dementia in other diseases classified elsewhere without behavioral disturbance: Secondary | ICD-10-CM | POA: Diagnosis not present

## 2015-09-22 DIAGNOSIS — R600 Localized edema: Secondary | ICD-10-CM | POA: Diagnosis not present

## 2015-09-22 DIAGNOSIS — M810 Age-related osteoporosis without current pathological fracture: Secondary | ICD-10-CM | POA: Diagnosis not present

## 2015-09-22 DIAGNOSIS — R112 Nausea with vomiting, unspecified: Secondary | ICD-10-CM | POA: Diagnosis not present

## 2015-09-22 DIAGNOSIS — R040 Epistaxis: Secondary | ICD-10-CM | POA: Diagnosis not present

## 2015-09-22 DIAGNOSIS — H1031 Unspecified acute conjunctivitis, right eye: Secondary | ICD-10-CM | POA: Diagnosis not present

## 2015-09-22 DIAGNOSIS — E559 Vitamin D deficiency, unspecified: Secondary | ICD-10-CM | POA: Diagnosis not present

## 2015-09-22 DIAGNOSIS — K219 Gastro-esophageal reflux disease without esophagitis: Secondary | ICD-10-CM | POA: Diagnosis not present

## 2015-09-22 DIAGNOSIS — D649 Anemia, unspecified: Secondary | ICD-10-CM | POA: Diagnosis not present

## 2015-09-22 DIAGNOSIS — I639 Cerebral infarction, unspecified: Secondary | ICD-10-CM | POA: Diagnosis not present

## 2015-09-30 DIAGNOSIS — M6281 Muscle weakness (generalized): Secondary | ICD-10-CM | POA: Diagnosis not present

## 2015-09-30 DIAGNOSIS — R269 Unspecified abnormalities of gait and mobility: Secondary | ICD-10-CM | POA: Diagnosis not present

## 2015-09-30 DIAGNOSIS — S7224XS Nondisplaced subtrochanteric fracture of right femur, sequela: Secondary | ICD-10-CM | POA: Diagnosis not present

## 2015-09-30 DIAGNOSIS — I10 Essential (primary) hypertension: Secondary | ICD-10-CM | POA: Diagnosis not present

## 2015-09-30 DIAGNOSIS — I6789 Other cerebrovascular disease: Secondary | ICD-10-CM | POA: Diagnosis not present

## 2015-10-06 DIAGNOSIS — G894 Chronic pain syndrome: Secondary | ICD-10-CM | POA: Diagnosis not present

## 2015-10-20 DIAGNOSIS — E559 Vitamin D deficiency, unspecified: Secondary | ICD-10-CM | POA: Diagnosis not present

## 2015-10-20 DIAGNOSIS — I639 Cerebral infarction, unspecified: Secondary | ICD-10-CM | POA: Diagnosis not present

## 2015-10-20 DIAGNOSIS — R600 Localized edema: Secondary | ICD-10-CM | POA: Diagnosis not present

## 2015-10-20 DIAGNOSIS — R112 Nausea with vomiting, unspecified: Secondary | ICD-10-CM | POA: Diagnosis not present

## 2015-10-20 DIAGNOSIS — H1031 Unspecified acute conjunctivitis, right eye: Secondary | ICD-10-CM | POA: Diagnosis not present

## 2015-10-20 DIAGNOSIS — M810 Age-related osteoporosis without current pathological fracture: Secondary | ICD-10-CM | POA: Diagnosis not present

## 2015-10-20 DIAGNOSIS — R040 Epistaxis: Secondary | ICD-10-CM | POA: Diagnosis not present

## 2015-10-20 DIAGNOSIS — D649 Anemia, unspecified: Secondary | ICD-10-CM | POA: Diagnosis not present

## 2015-10-20 DIAGNOSIS — K5901 Slow transit constipation: Secondary | ICD-10-CM | POA: Diagnosis not present

## 2015-10-22 DIAGNOSIS — E611 Iron deficiency: Secondary | ICD-10-CM | POA: Diagnosis not present

## 2015-10-27 DIAGNOSIS — L03116 Cellulitis of left lower limb: Secondary | ICD-10-CM | POA: Diagnosis not present

## 2015-10-31 DIAGNOSIS — S7224XS Nondisplaced subtrochanteric fracture of right femur, sequela: Secondary | ICD-10-CM | POA: Diagnosis not present

## 2015-10-31 DIAGNOSIS — I6789 Other cerebrovascular disease: Secondary | ICD-10-CM | POA: Diagnosis not present

## 2015-10-31 DIAGNOSIS — I10 Essential (primary) hypertension: Secondary | ICD-10-CM | POA: Diagnosis not present

## 2015-10-31 DIAGNOSIS — M6281 Muscle weakness (generalized): Secondary | ICD-10-CM | POA: Diagnosis not present

## 2015-10-31 DIAGNOSIS — R269 Unspecified abnormalities of gait and mobility: Secondary | ICD-10-CM | POA: Diagnosis not present

## 2015-11-03 DIAGNOSIS — F0150 Vascular dementia without behavioral disturbance: Secondary | ICD-10-CM | POA: Diagnosis not present

## 2015-11-13 DIAGNOSIS — M79671 Pain in right foot: Secondary | ICD-10-CM | POA: Diagnosis not present

## 2015-11-13 DIAGNOSIS — L603 Nail dystrophy: Secondary | ICD-10-CM | POA: Diagnosis not present

## 2015-11-13 DIAGNOSIS — B351 Tinea unguium: Secondary | ICD-10-CM | POA: Diagnosis not present

## 2015-11-13 DIAGNOSIS — M79672 Pain in left foot: Secondary | ICD-10-CM | POA: Diagnosis not present

## 2015-11-13 DIAGNOSIS — Q845 Enlarged and hypertrophic nails: Secondary | ICD-10-CM | POA: Diagnosis not present

## 2015-11-17 DIAGNOSIS — K219 Gastro-esophageal reflux disease without esophagitis: Secondary | ICD-10-CM | POA: Diagnosis not present

## 2015-11-17 DIAGNOSIS — H1031 Unspecified acute conjunctivitis, right eye: Secondary | ICD-10-CM | POA: Diagnosis not present

## 2015-11-17 DIAGNOSIS — D649 Anemia, unspecified: Secondary | ICD-10-CM | POA: Diagnosis not present

## 2015-11-17 DIAGNOSIS — R040 Epistaxis: Secondary | ICD-10-CM | POA: Diagnosis not present

## 2015-11-17 DIAGNOSIS — M810 Age-related osteoporosis without current pathological fracture: Secondary | ICD-10-CM | POA: Diagnosis not present

## 2015-11-17 DIAGNOSIS — R21 Rash and other nonspecific skin eruption: Secondary | ICD-10-CM | POA: Diagnosis not present

## 2015-11-17 DIAGNOSIS — R600 Localized edema: Secondary | ICD-10-CM | POA: Diagnosis not present

## 2015-11-17 DIAGNOSIS — E559 Vitamin D deficiency, unspecified: Secondary | ICD-10-CM | POA: Diagnosis not present

## 2015-11-17 DIAGNOSIS — I639 Cerebral infarction, unspecified: Secondary | ICD-10-CM | POA: Diagnosis not present

## 2015-11-19 DIAGNOSIS — G894 Chronic pain syndrome: Secondary | ICD-10-CM | POA: Diagnosis not present

## 2015-11-20 DIAGNOSIS — E039 Hypothyroidism, unspecified: Secondary | ICD-10-CM | POA: Diagnosis not present

## 2015-11-20 DIAGNOSIS — I10 Essential (primary) hypertension: Secondary | ICD-10-CM | POA: Diagnosis not present

## 2015-11-20 DIAGNOSIS — E782 Mixed hyperlipidemia: Secondary | ICD-10-CM | POA: Diagnosis not present

## 2015-11-24 IMAGING — CR DG HIP (WITH OR WITHOUT PELVIS) 2-3V*R*
3 series · 3 of 3 positions shown · non-contrast
Comparison: CT abdomen and pelvis 12/13/2013.  Abdomen 12/13/2013.

CLINICAL DATA: Patient fell in the bathroom. Patient is holding the
right hip. Confusion.

EXAM:
DG HIP (WITH OR WITHOUT PELVIS) 2-3V RIGHT

[x pelvis]
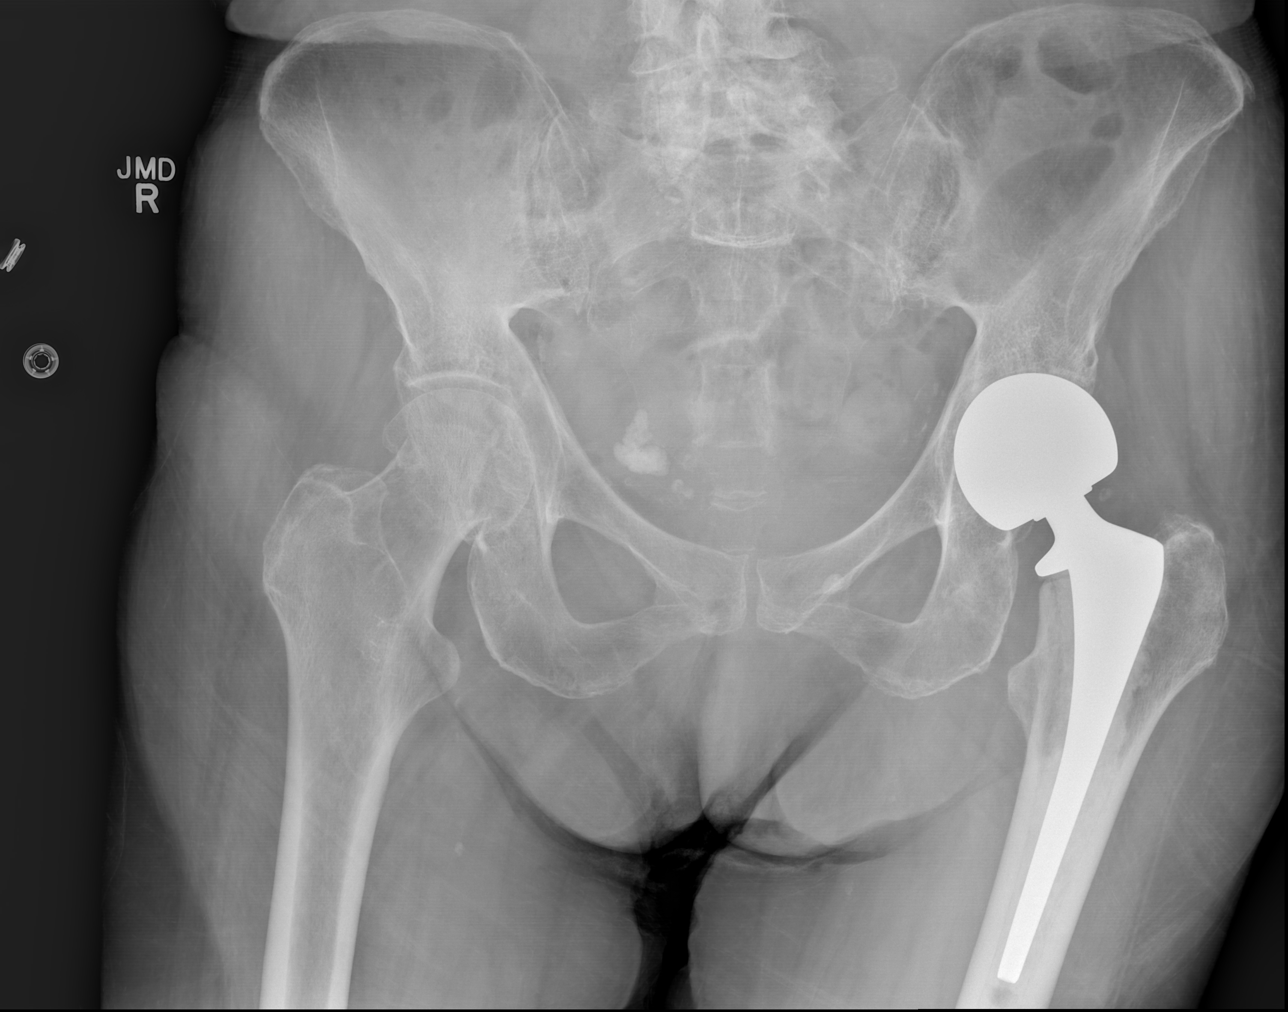

[x hip ap right]
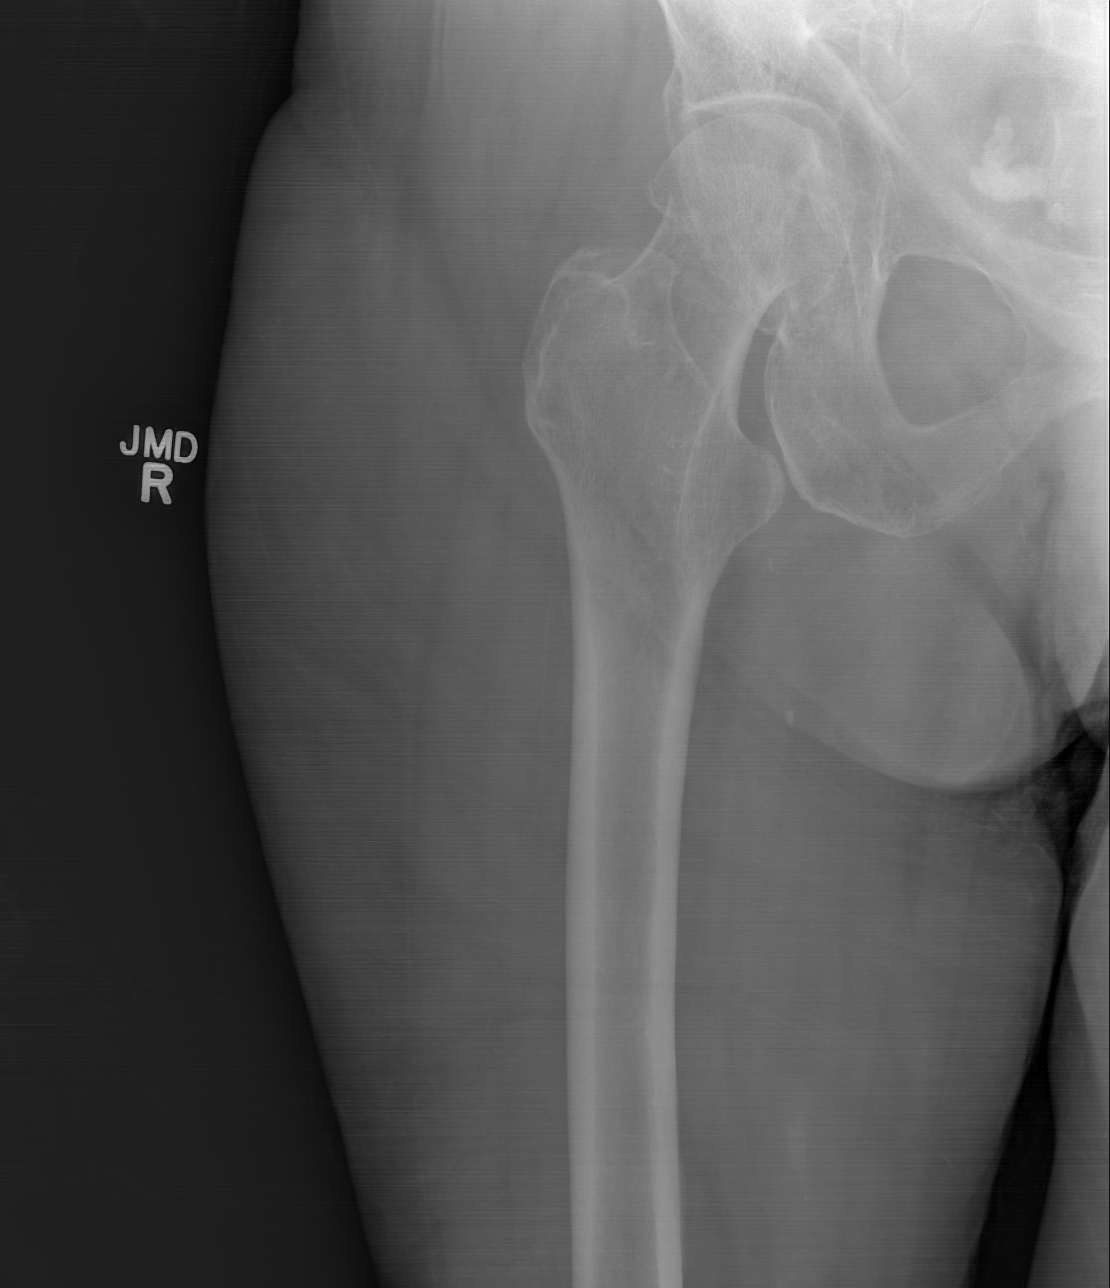

[w hip lat right]
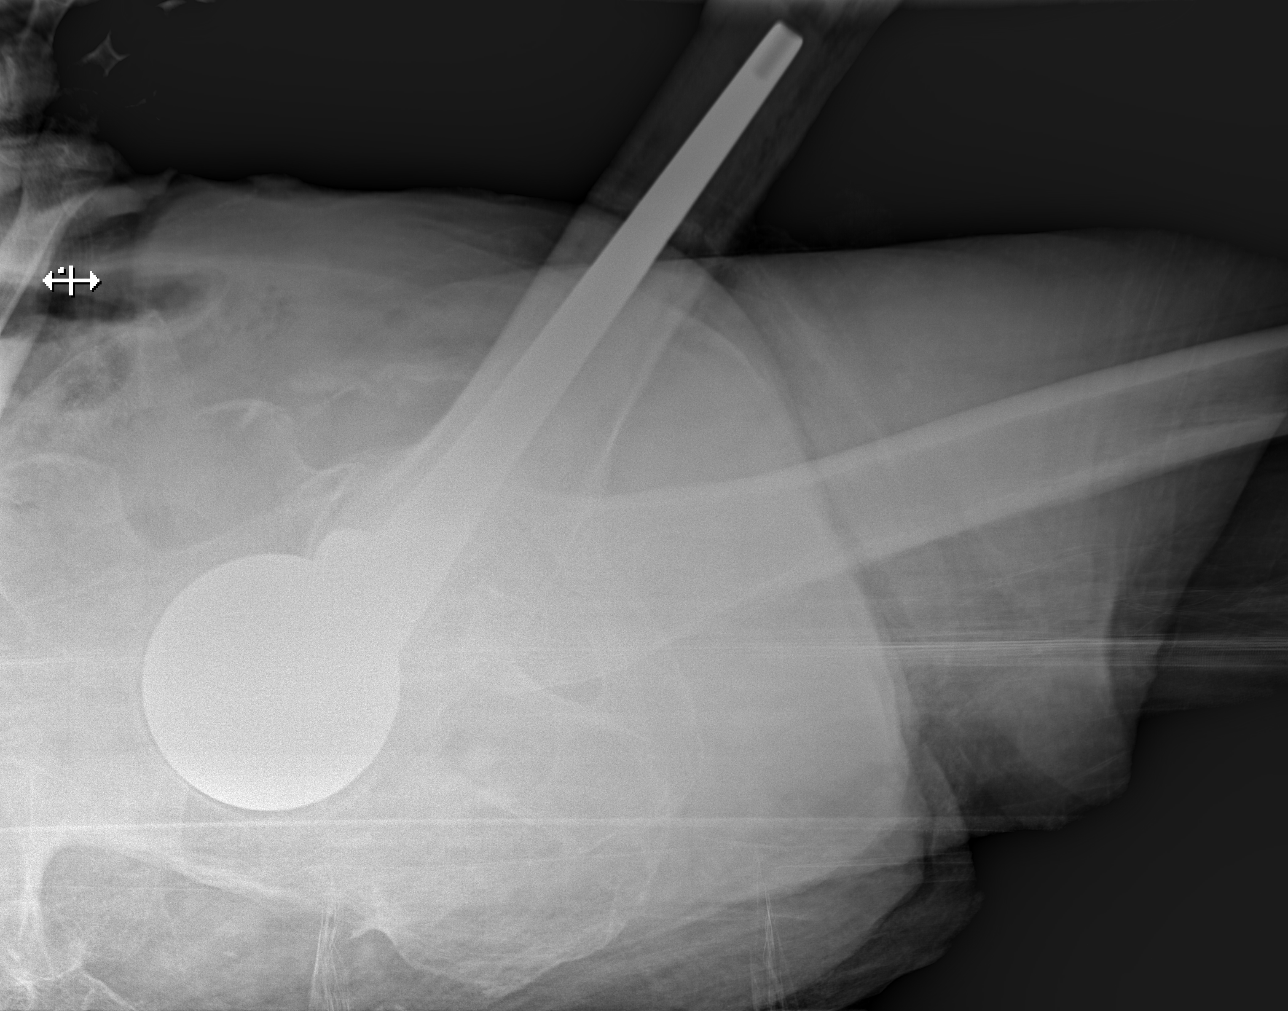

[3 of 3 positions shown; findings below may reference images not displayed]

FINDINGS: Degenerative changes in the right hip. Prominent osteophytes on the
femoral head. There appears to be acute angulation at the junction
of the head and neck which suggests an impacted nondisplaced
fracture of the subcapital region. Right hip hemiarthroplasty with
cemented femoral component. Pelvis appears intact. SI joints and
symphysis pubis are not displaced. Degenerative changes in the lower
lumbar spine. Calcification in the pelvis probably representing a
fibroid.
IMPRESSION: Impacted nondisplaced subcapital right femoral neck fracture.
Degenerative changes in the right hip. Left hip arthroplasty.

## 2015-11-27 IMAGING — DX DG ABD PORTABLE 1V
2 series · 2 of 2 positions shown · non-contrast
Comparison: CT 12/13/2013

CLINICAL DATA: Abdominal pain, leukocytosis

EXAM:
PORTABLE ABDOMEN - 1 VIEW

[abdomen kub (1 of 2)]
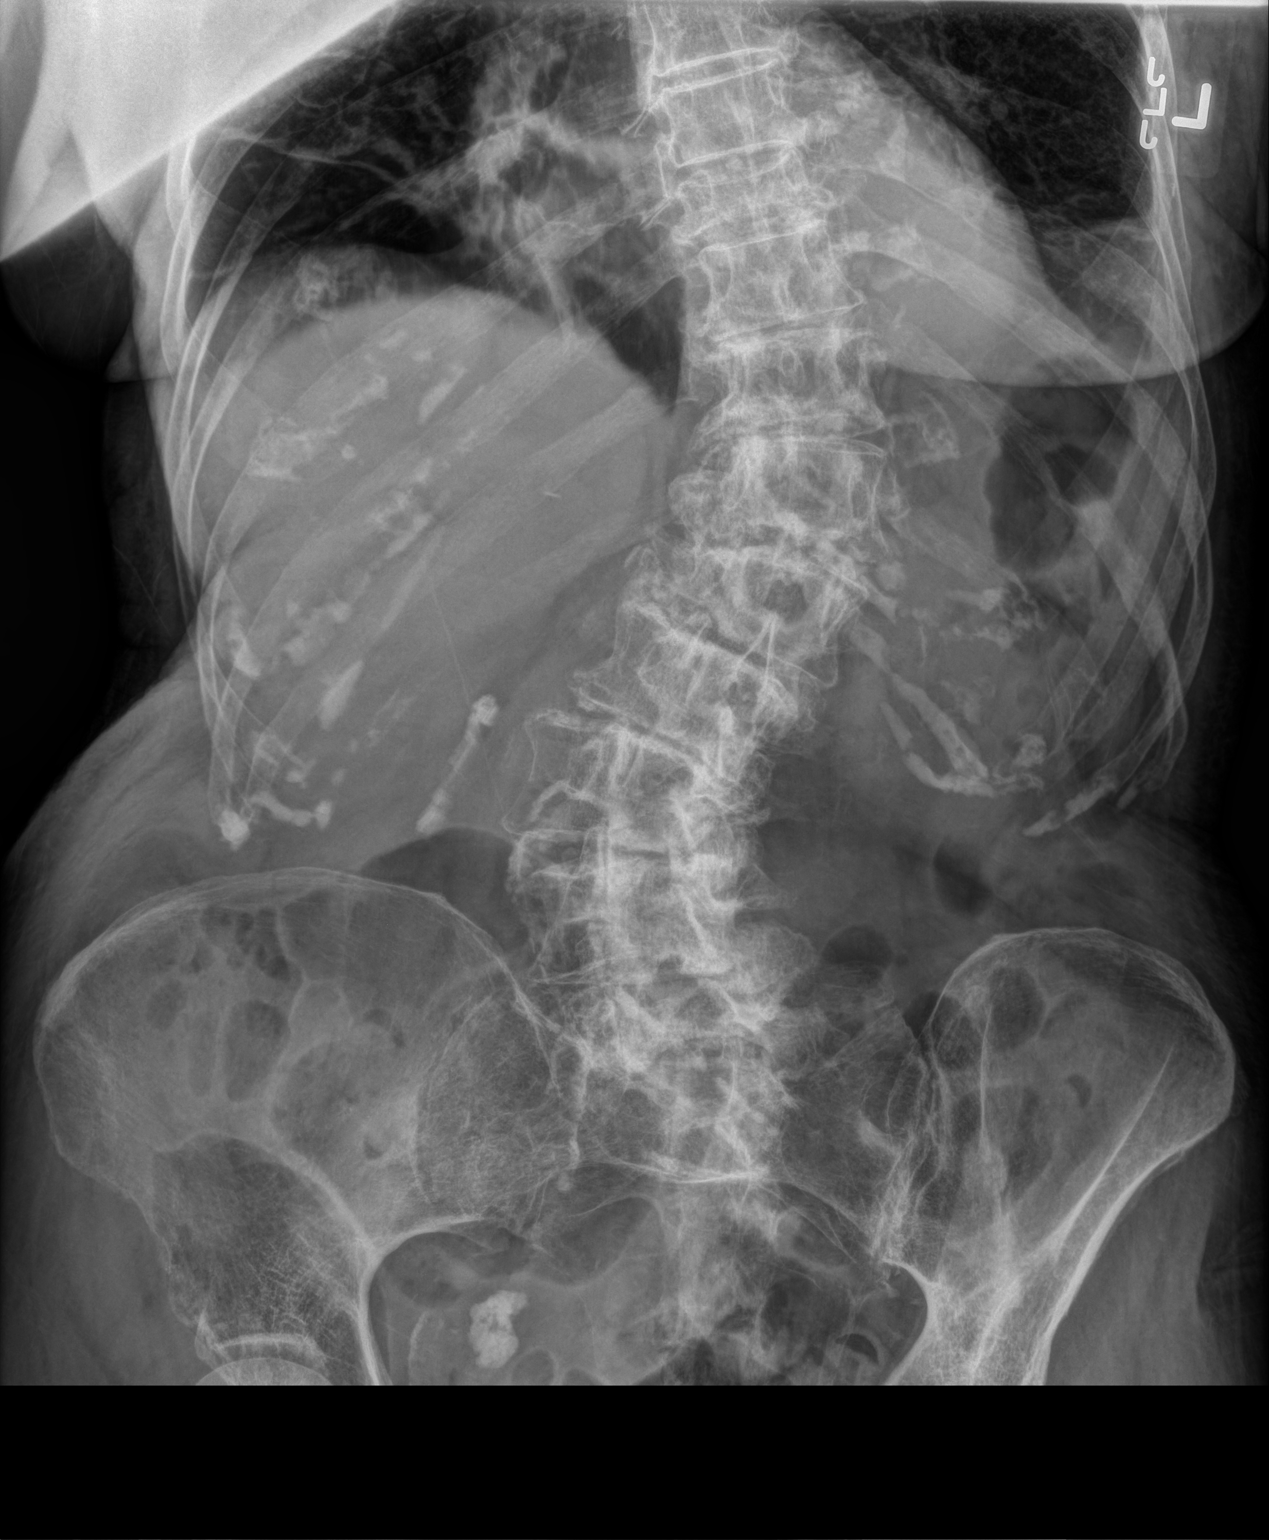

[abdomen kub (2 of 2)]
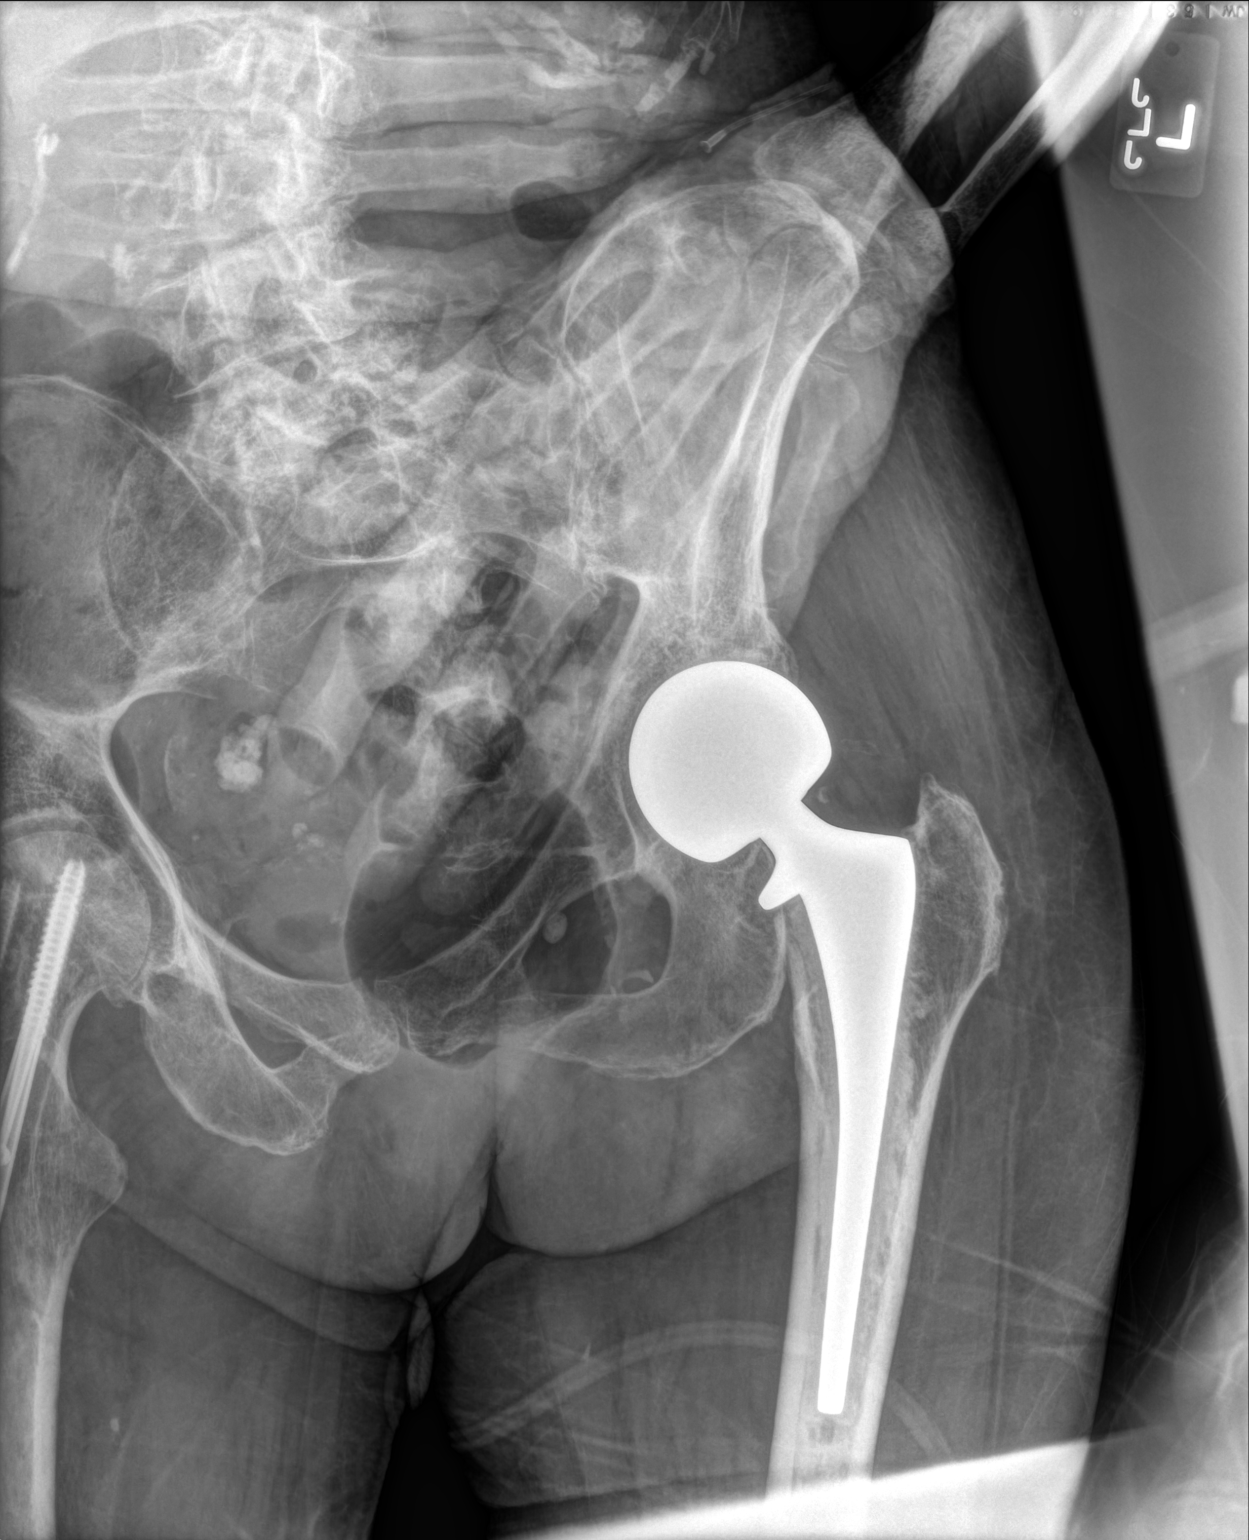

[2 of 2 positions shown; findings below may reference images not displayed]

FINDINGS: Nonobstructive bowel gas pattern. No free air organomegaly.
Scoliosis and degenerative changes in the thoracolumbar spine. Mild
compression fracture at at T9.
IMPRESSION: No evidence of bowel obstruction or free air.

## 2015-11-27 IMAGING — DX DG CHEST 1V PORT
1 series · 1 of 1 positions shown · non-contrast
Comparison: Portable exam 8858 hours compared to 05/08/2015

CLINICAL DATA: Fever, leukocytosis, confusion

EXAM:
PORTABLE CHEST 1 VIEW

[chest ap]
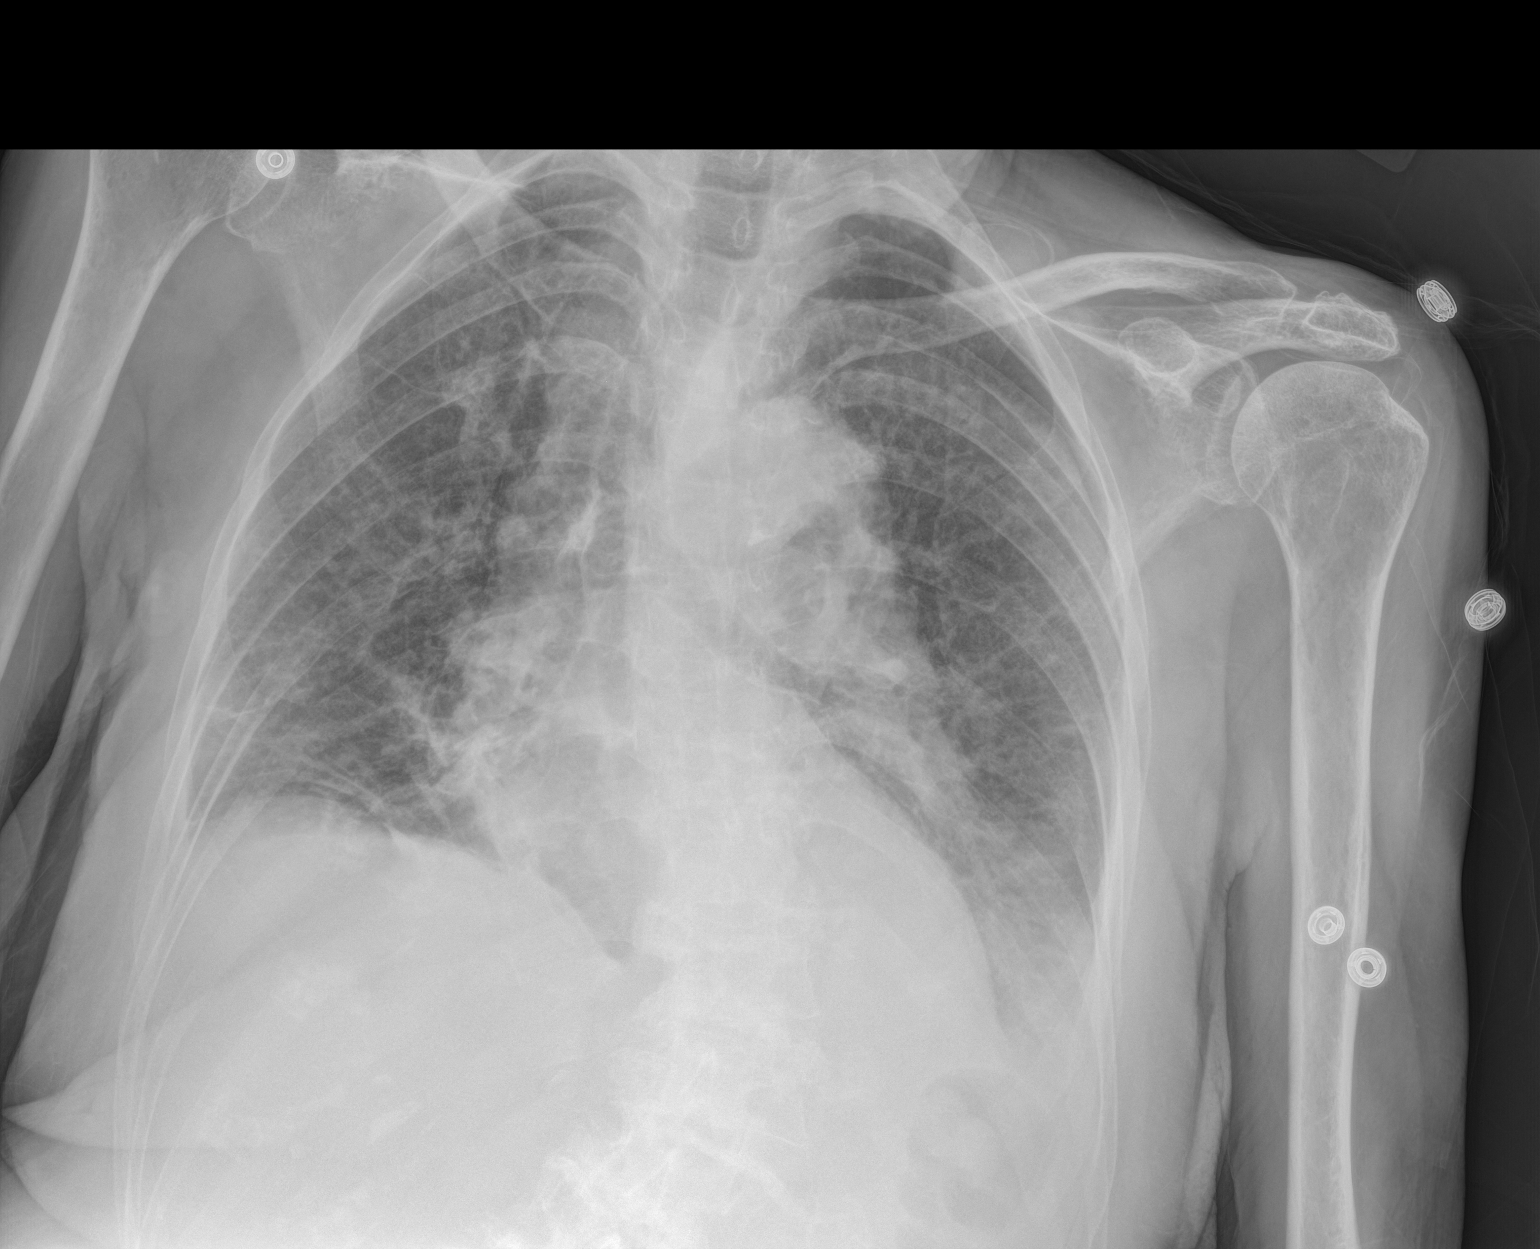

[1 of 1 positions shown; findings below may reference images not displayed]

FINDINGS: Upper normal heart size.

Calcified tortuous aorta.

Stable mediastinal contours.

Persistent RIGHT basilar atelectasis.

Increased consolidation versus atelectasis in LEFT lower lobe.

New mild diffuse interstitial prominence since previous study may be
related to slight hypo inflation or potentially early infiltrate.

Tiny LEFT pleural effusion.

No pneumothorax.

Bones demineralized.
IMPRESSION: RIGHT basilar sub atelectasis persists.

New atelectasis versus consolidation LEFT lower lobe.

Increased interstitial prominence question related to hypo inflation
nose mild edema or early infiltrate not excluded.

## 2015-12-01 DIAGNOSIS — I10 Essential (primary) hypertension: Secondary | ICD-10-CM | POA: Diagnosis not present

## 2015-12-01 DIAGNOSIS — M6281 Muscle weakness (generalized): Secondary | ICD-10-CM | POA: Diagnosis not present

## 2015-12-01 DIAGNOSIS — S7224XS Nondisplaced subtrochanteric fracture of right femur, sequela: Secondary | ICD-10-CM | POA: Diagnosis not present

## 2015-12-01 DIAGNOSIS — I6789 Other cerebrovascular disease: Secondary | ICD-10-CM | POA: Diagnosis not present

## 2015-12-01 DIAGNOSIS — R269 Unspecified abnormalities of gait and mobility: Secondary | ICD-10-CM | POA: Diagnosis not present

## 2015-12-15 DIAGNOSIS — K219 Gastro-esophageal reflux disease without esophagitis: Secondary | ICD-10-CM | POA: Diagnosis not present

## 2015-12-15 DIAGNOSIS — H1031 Unspecified acute conjunctivitis, right eye: Secondary | ICD-10-CM | POA: Diagnosis not present

## 2015-12-15 DIAGNOSIS — R21 Rash and other nonspecific skin eruption: Secondary | ICD-10-CM | POA: Diagnosis not present

## 2015-12-15 DIAGNOSIS — E559 Vitamin D deficiency, unspecified: Secondary | ICD-10-CM | POA: Diagnosis not present

## 2015-12-15 DIAGNOSIS — M810 Age-related osteoporosis without current pathological fracture: Secondary | ICD-10-CM | POA: Diagnosis not present

## 2015-12-15 DIAGNOSIS — I639 Cerebral infarction, unspecified: Secondary | ICD-10-CM | POA: Diagnosis not present

## 2015-12-15 DIAGNOSIS — D649 Anemia, unspecified: Secondary | ICD-10-CM | POA: Diagnosis not present

## 2015-12-15 DIAGNOSIS — R040 Epistaxis: Secondary | ICD-10-CM | POA: Diagnosis not present

## 2015-12-15 DIAGNOSIS — R600 Localized edema: Secondary | ICD-10-CM | POA: Diagnosis not present

## 2015-12-22 DIAGNOSIS — G894 Chronic pain syndrome: Secondary | ICD-10-CM | POA: Diagnosis not present

## 2015-12-29 DIAGNOSIS — S7224XS Nondisplaced subtrochanteric fracture of right femur, sequela: Secondary | ICD-10-CM | POA: Diagnosis not present

## 2015-12-29 DIAGNOSIS — R269 Unspecified abnormalities of gait and mobility: Secondary | ICD-10-CM | POA: Diagnosis not present

## 2015-12-29 DIAGNOSIS — M6281 Muscle weakness (generalized): Secondary | ICD-10-CM | POA: Diagnosis not present

## 2015-12-29 DIAGNOSIS — I6789 Other cerebrovascular disease: Secondary | ICD-10-CM | POA: Diagnosis not present

## 2015-12-29 DIAGNOSIS — H00023 Hordeolum internum right eye, unspecified eyelid: Secondary | ICD-10-CM | POA: Diagnosis not present

## 2015-12-29 DIAGNOSIS — I10 Essential (primary) hypertension: Secondary | ICD-10-CM | POA: Diagnosis not present

## 2016-01-12 DIAGNOSIS — E559 Vitamin D deficiency, unspecified: Secondary | ICD-10-CM | POA: Diagnosis not present

## 2016-01-12 DIAGNOSIS — R040 Epistaxis: Secondary | ICD-10-CM | POA: Diagnosis not present

## 2016-01-12 DIAGNOSIS — R600 Localized edema: Secondary | ICD-10-CM | POA: Diagnosis not present

## 2016-01-12 DIAGNOSIS — K219 Gastro-esophageal reflux disease without esophagitis: Secondary | ICD-10-CM | POA: Diagnosis not present

## 2016-01-12 DIAGNOSIS — D649 Anemia, unspecified: Secondary | ICD-10-CM | POA: Diagnosis not present

## 2016-01-12 DIAGNOSIS — H00023 Hordeolum internum right eye, unspecified eyelid: Secondary | ICD-10-CM | POA: Diagnosis not present

## 2016-01-12 DIAGNOSIS — M810 Age-related osteoporosis without current pathological fracture: Secondary | ICD-10-CM | POA: Diagnosis not present

## 2016-01-12 DIAGNOSIS — R112 Nausea with vomiting, unspecified: Secondary | ICD-10-CM | POA: Diagnosis not present

## 2016-01-12 DIAGNOSIS — I639 Cerebral infarction, unspecified: Secondary | ICD-10-CM | POA: Diagnosis not present

## 2016-01-12 DIAGNOSIS — F0150 Vascular dementia without behavioral disturbance: Secondary | ICD-10-CM | POA: Diagnosis not present

## 2016-01-15 DIAGNOSIS — M150 Primary generalized (osteo)arthritis: Secondary | ICD-10-CM | POA: Diagnosis not present

## 2016-01-15 DIAGNOSIS — F0150 Vascular dementia without behavioral disturbance: Secondary | ICD-10-CM | POA: Diagnosis not present

## 2016-01-15 DIAGNOSIS — M2010 Hallux valgus (acquired), unspecified foot: Secondary | ICD-10-CM | POA: Diagnosis not present

## 2016-01-19 DIAGNOSIS — G894 Chronic pain syndrome: Secondary | ICD-10-CM | POA: Diagnosis not present

## 2016-01-29 DIAGNOSIS — M6281 Muscle weakness (generalized): Secondary | ICD-10-CM | POA: Diagnosis not present

## 2016-01-29 DIAGNOSIS — I10 Essential (primary) hypertension: Secondary | ICD-10-CM | POA: Diagnosis not present

## 2016-01-29 DIAGNOSIS — I6789 Other cerebrovascular disease: Secondary | ICD-10-CM | POA: Diagnosis not present

## 2016-01-29 DIAGNOSIS — S7224XS Nondisplaced subtrochanteric fracture of right femur, sequela: Secondary | ICD-10-CM | POA: Diagnosis not present

## 2016-01-29 DIAGNOSIS — R269 Unspecified abnormalities of gait and mobility: Secondary | ICD-10-CM | POA: Diagnosis not present

## 2016-02-02 DIAGNOSIS — S81802A Unspecified open wound, left lower leg, initial encounter: Secondary | ICD-10-CM | POA: Diagnosis not present

## 2016-02-04 DIAGNOSIS — F0280 Dementia in other diseases classified elsewhere without behavioral disturbance: Secondary | ICD-10-CM | POA: Diagnosis not present

## 2016-02-04 DIAGNOSIS — I10 Essential (primary) hypertension: Secondary | ICD-10-CM | POA: Diagnosis not present

## 2016-02-04 DIAGNOSIS — S81802D Unspecified open wound, left lower leg, subsequent encounter: Secondary | ICD-10-CM | POA: Diagnosis not present

## 2016-02-04 DIAGNOSIS — M6281 Muscle weakness (generalized): Secondary | ICD-10-CM | POA: Diagnosis not present

## 2016-02-04 DIAGNOSIS — M810 Age-related osteoporosis without current pathological fracture: Secondary | ICD-10-CM | POA: Diagnosis not present

## 2016-02-04 DIAGNOSIS — G309 Alzheimer's disease, unspecified: Secondary | ICD-10-CM | POA: Diagnosis not present

## 2016-02-09 DIAGNOSIS — M6281 Muscle weakness (generalized): Secondary | ICD-10-CM | POA: Diagnosis not present

## 2016-02-09 DIAGNOSIS — G309 Alzheimer's disease, unspecified: Secondary | ICD-10-CM | POA: Diagnosis not present

## 2016-02-09 DIAGNOSIS — I10 Essential (primary) hypertension: Secondary | ICD-10-CM | POA: Diagnosis not present

## 2016-02-09 DIAGNOSIS — K219 Gastro-esophageal reflux disease without esophagitis: Secondary | ICD-10-CM | POA: Diagnosis not present

## 2016-02-09 DIAGNOSIS — S81802A Unspecified open wound, left lower leg, initial encounter: Secondary | ICD-10-CM | POA: Diagnosis not present

## 2016-02-09 DIAGNOSIS — M810 Age-related osteoporosis without current pathological fracture: Secondary | ICD-10-CM | POA: Diagnosis not present

## 2016-02-09 DIAGNOSIS — F0280 Dementia in other diseases classified elsewhere without behavioral disturbance: Secondary | ICD-10-CM | POA: Diagnosis not present

## 2016-02-09 DIAGNOSIS — H00023 Hordeolum internum right eye, unspecified eyelid: Secondary | ICD-10-CM | POA: Diagnosis not present

## 2016-02-09 DIAGNOSIS — I639 Cerebral infarction, unspecified: Secondary | ICD-10-CM | POA: Diagnosis not present

## 2016-02-09 DIAGNOSIS — R112 Nausea with vomiting, unspecified: Secondary | ICD-10-CM | POA: Diagnosis not present

## 2016-02-09 DIAGNOSIS — S81802D Unspecified open wound, left lower leg, subsequent encounter: Secondary | ICD-10-CM | POA: Diagnosis not present

## 2016-02-09 DIAGNOSIS — E559 Vitamin D deficiency, unspecified: Secondary | ICD-10-CM | POA: Diagnosis not present

## 2016-02-09 DIAGNOSIS — R600 Localized edema: Secondary | ICD-10-CM | POA: Diagnosis not present

## 2016-02-09 DIAGNOSIS — D649 Anemia, unspecified: Secondary | ICD-10-CM | POA: Diagnosis not present

## 2016-02-11 DIAGNOSIS — M810 Age-related osteoporosis without current pathological fracture: Secondary | ICD-10-CM | POA: Diagnosis not present

## 2016-02-11 DIAGNOSIS — G309 Alzheimer's disease, unspecified: Secondary | ICD-10-CM | POA: Diagnosis not present

## 2016-02-11 DIAGNOSIS — F0280 Dementia in other diseases classified elsewhere without behavioral disturbance: Secondary | ICD-10-CM | POA: Diagnosis not present

## 2016-02-11 DIAGNOSIS — S81802D Unspecified open wound, left lower leg, subsequent encounter: Secondary | ICD-10-CM | POA: Diagnosis not present

## 2016-02-11 DIAGNOSIS — M6281 Muscle weakness (generalized): Secondary | ICD-10-CM | POA: Diagnosis not present

## 2016-02-11 DIAGNOSIS — I10 Essential (primary) hypertension: Secondary | ICD-10-CM | POA: Diagnosis not present

## 2016-02-16 DIAGNOSIS — F0280 Dementia in other diseases classified elsewhere without behavioral disturbance: Secondary | ICD-10-CM | POA: Diagnosis not present

## 2016-02-16 DIAGNOSIS — M6281 Muscle weakness (generalized): Secondary | ICD-10-CM | POA: Diagnosis not present

## 2016-02-16 DIAGNOSIS — I10 Essential (primary) hypertension: Secondary | ICD-10-CM | POA: Diagnosis not present

## 2016-02-16 DIAGNOSIS — G894 Chronic pain syndrome: Secondary | ICD-10-CM | POA: Diagnosis not present

## 2016-02-16 DIAGNOSIS — S81802D Unspecified open wound, left lower leg, subsequent encounter: Secondary | ICD-10-CM | POA: Diagnosis not present

## 2016-02-16 DIAGNOSIS — M810 Age-related osteoporosis without current pathological fracture: Secondary | ICD-10-CM | POA: Diagnosis not present

## 2016-02-16 DIAGNOSIS — G309 Alzheimer's disease, unspecified: Secondary | ICD-10-CM | POA: Diagnosis not present

## 2016-02-18 DIAGNOSIS — M6281 Muscle weakness (generalized): Secondary | ICD-10-CM | POA: Diagnosis not present

## 2016-02-18 DIAGNOSIS — F0280 Dementia in other diseases classified elsewhere without behavioral disturbance: Secondary | ICD-10-CM | POA: Diagnosis not present

## 2016-02-18 DIAGNOSIS — S81802D Unspecified open wound, left lower leg, subsequent encounter: Secondary | ICD-10-CM | POA: Diagnosis not present

## 2016-02-18 DIAGNOSIS — G309 Alzheimer's disease, unspecified: Secondary | ICD-10-CM | POA: Diagnosis not present

## 2016-02-18 DIAGNOSIS — I10 Essential (primary) hypertension: Secondary | ICD-10-CM | POA: Diagnosis not present

## 2016-02-18 DIAGNOSIS — M810 Age-related osteoporosis without current pathological fracture: Secondary | ICD-10-CM | POA: Diagnosis not present

## 2016-02-23 DIAGNOSIS — I10 Essential (primary) hypertension: Secondary | ICD-10-CM | POA: Diagnosis not present

## 2016-02-23 DIAGNOSIS — F0280 Dementia in other diseases classified elsewhere without behavioral disturbance: Secondary | ICD-10-CM | POA: Diagnosis not present

## 2016-02-23 DIAGNOSIS — M6281 Muscle weakness (generalized): Secondary | ICD-10-CM | POA: Diagnosis not present

## 2016-02-23 DIAGNOSIS — S81802D Unspecified open wound, left lower leg, subsequent encounter: Secondary | ICD-10-CM | POA: Diagnosis not present

## 2016-02-23 DIAGNOSIS — G309 Alzheimer's disease, unspecified: Secondary | ICD-10-CM | POA: Diagnosis not present

## 2016-02-23 DIAGNOSIS — M810 Age-related osteoporosis without current pathological fracture: Secondary | ICD-10-CM | POA: Diagnosis not present

## 2016-02-28 DIAGNOSIS — I6789 Other cerebrovascular disease: Secondary | ICD-10-CM | POA: Diagnosis not present

## 2016-02-28 DIAGNOSIS — S7224XS Nondisplaced subtrochanteric fracture of right femur, sequela: Secondary | ICD-10-CM | POA: Diagnosis not present

## 2016-02-28 DIAGNOSIS — M6281 Muscle weakness (generalized): Secondary | ICD-10-CM | POA: Diagnosis not present

## 2016-02-28 DIAGNOSIS — R269 Unspecified abnormalities of gait and mobility: Secondary | ICD-10-CM | POA: Diagnosis not present

## 2016-02-28 DIAGNOSIS — I10 Essential (primary) hypertension: Secondary | ICD-10-CM | POA: Diagnosis not present

## 2016-03-02 DIAGNOSIS — M810 Age-related osteoporosis without current pathological fracture: Secondary | ICD-10-CM | POA: Diagnosis not present

## 2016-03-02 DIAGNOSIS — I10 Essential (primary) hypertension: Secondary | ICD-10-CM | POA: Diagnosis not present

## 2016-03-02 DIAGNOSIS — S81802D Unspecified open wound, left lower leg, subsequent encounter: Secondary | ICD-10-CM | POA: Diagnosis not present

## 2016-03-02 DIAGNOSIS — M6281 Muscle weakness (generalized): Secondary | ICD-10-CM | POA: Diagnosis not present

## 2016-03-02 DIAGNOSIS — G309 Alzheimer's disease, unspecified: Secondary | ICD-10-CM | POA: Diagnosis not present

## 2016-03-02 DIAGNOSIS — F0280 Dementia in other diseases classified elsewhere without behavioral disturbance: Secondary | ICD-10-CM | POA: Diagnosis not present

## 2016-03-04 DIAGNOSIS — M6281 Muscle weakness (generalized): Secondary | ICD-10-CM | POA: Diagnosis not present

## 2016-03-04 DIAGNOSIS — I10 Essential (primary) hypertension: Secondary | ICD-10-CM | POA: Diagnosis not present

## 2016-03-04 DIAGNOSIS — S81802D Unspecified open wound, left lower leg, subsequent encounter: Secondary | ICD-10-CM | POA: Diagnosis not present

## 2016-03-04 DIAGNOSIS — F0280 Dementia in other diseases classified elsewhere without behavioral disturbance: Secondary | ICD-10-CM | POA: Diagnosis not present

## 2016-03-04 DIAGNOSIS — M810 Age-related osteoporosis without current pathological fracture: Secondary | ICD-10-CM | POA: Diagnosis not present

## 2016-03-04 DIAGNOSIS — G309 Alzheimer's disease, unspecified: Secondary | ICD-10-CM | POA: Diagnosis not present

## 2016-03-08 DIAGNOSIS — S81802A Unspecified open wound, left lower leg, initial encounter: Secondary | ICD-10-CM | POA: Diagnosis not present

## 2016-03-08 DIAGNOSIS — K5901 Slow transit constipation: Secondary | ICD-10-CM | POA: Diagnosis not present

## 2016-03-08 DIAGNOSIS — S81802D Unspecified open wound, left lower leg, subsequent encounter: Secondary | ICD-10-CM | POA: Diagnosis not present

## 2016-03-08 DIAGNOSIS — G309 Alzheimer's disease, unspecified: Secondary | ICD-10-CM | POA: Diagnosis not present

## 2016-03-08 DIAGNOSIS — R600 Localized edema: Secondary | ICD-10-CM | POA: Diagnosis not present

## 2016-03-08 DIAGNOSIS — E559 Vitamin D deficiency, unspecified: Secondary | ICD-10-CM | POA: Diagnosis not present

## 2016-03-08 DIAGNOSIS — I10 Essential (primary) hypertension: Secondary | ICD-10-CM | POA: Diagnosis not present

## 2016-03-08 DIAGNOSIS — M6281 Muscle weakness (generalized): Secondary | ICD-10-CM | POA: Diagnosis not present

## 2016-03-08 DIAGNOSIS — D649 Anemia, unspecified: Secondary | ICD-10-CM | POA: Diagnosis not present

## 2016-03-08 DIAGNOSIS — F0280 Dementia in other diseases classified elsewhere without behavioral disturbance: Secondary | ICD-10-CM | POA: Diagnosis not present

## 2016-03-08 DIAGNOSIS — R112 Nausea with vomiting, unspecified: Secondary | ICD-10-CM | POA: Diagnosis not present

## 2016-03-08 DIAGNOSIS — K219 Gastro-esophageal reflux disease without esophagitis: Secondary | ICD-10-CM | POA: Diagnosis not present

## 2016-03-08 DIAGNOSIS — M810 Age-related osteoporosis without current pathological fracture: Secondary | ICD-10-CM | POA: Diagnosis not present

## 2016-03-08 DIAGNOSIS — I639 Cerebral infarction, unspecified: Secondary | ICD-10-CM | POA: Diagnosis not present

## 2016-03-12 DIAGNOSIS — G309 Alzheimer's disease, unspecified: Secondary | ICD-10-CM | POA: Diagnosis not present

## 2016-03-12 DIAGNOSIS — F0280 Dementia in other diseases classified elsewhere without behavioral disturbance: Secondary | ICD-10-CM | POA: Diagnosis not present

## 2016-03-12 DIAGNOSIS — M6281 Muscle weakness (generalized): Secondary | ICD-10-CM | POA: Diagnosis not present

## 2016-03-12 DIAGNOSIS — S81802D Unspecified open wound, left lower leg, subsequent encounter: Secondary | ICD-10-CM | POA: Diagnosis not present

## 2016-03-12 DIAGNOSIS — M810 Age-related osteoporosis without current pathological fracture: Secondary | ICD-10-CM | POA: Diagnosis not present

## 2016-03-12 DIAGNOSIS — I10 Essential (primary) hypertension: Secondary | ICD-10-CM | POA: Diagnosis not present

## 2016-03-14 DIAGNOSIS — G309 Alzheimer's disease, unspecified: Secondary | ICD-10-CM | POA: Diagnosis not present

## 2016-03-14 DIAGNOSIS — I10 Essential (primary) hypertension: Secondary | ICD-10-CM | POA: Diagnosis not present

## 2016-03-14 DIAGNOSIS — F0280 Dementia in other diseases classified elsewhere without behavioral disturbance: Secondary | ICD-10-CM | POA: Diagnosis not present

## 2016-03-14 DIAGNOSIS — M6281 Muscle weakness (generalized): Secondary | ICD-10-CM | POA: Diagnosis not present

## 2016-03-14 DIAGNOSIS — M810 Age-related osteoporosis without current pathological fracture: Secondary | ICD-10-CM | POA: Diagnosis not present

## 2016-03-14 DIAGNOSIS — S81802D Unspecified open wound, left lower leg, subsequent encounter: Secondary | ICD-10-CM | POA: Diagnosis not present

## 2016-03-15 DIAGNOSIS — G894 Chronic pain syndrome: Secondary | ICD-10-CM | POA: Diagnosis not present

## 2016-03-15 DIAGNOSIS — F0150 Vascular dementia without behavioral disturbance: Secondary | ICD-10-CM | POA: Diagnosis not present

## 2016-03-15 DIAGNOSIS — S81802A Unspecified open wound, left lower leg, initial encounter: Secondary | ICD-10-CM | POA: Diagnosis not present

## 2016-03-18 DIAGNOSIS — F0280 Dementia in other diseases classified elsewhere without behavioral disturbance: Secondary | ICD-10-CM | POA: Diagnosis not present

## 2016-03-18 DIAGNOSIS — S81802D Unspecified open wound, left lower leg, subsequent encounter: Secondary | ICD-10-CM | POA: Diagnosis not present

## 2016-03-18 DIAGNOSIS — I10 Essential (primary) hypertension: Secondary | ICD-10-CM | POA: Diagnosis not present

## 2016-03-18 DIAGNOSIS — M6281 Muscle weakness (generalized): Secondary | ICD-10-CM | POA: Diagnosis not present

## 2016-03-18 DIAGNOSIS — G309 Alzheimer's disease, unspecified: Secondary | ICD-10-CM | POA: Diagnosis not present

## 2016-03-18 DIAGNOSIS — M810 Age-related osteoporosis without current pathological fracture: Secondary | ICD-10-CM | POA: Diagnosis not present

## 2016-03-21 DIAGNOSIS — I639 Cerebral infarction, unspecified: Secondary | ICD-10-CM | POA: Diagnosis not present

## 2016-03-21 DIAGNOSIS — K219 Gastro-esophageal reflux disease without esophagitis: Secondary | ICD-10-CM | POA: Diagnosis not present

## 2016-03-21 DIAGNOSIS — R112 Nausea with vomiting, unspecified: Secondary | ICD-10-CM | POA: Diagnosis not present

## 2016-03-21 DIAGNOSIS — K5901 Slow transit constipation: Secondary | ICD-10-CM | POA: Diagnosis not present

## 2016-03-21 DIAGNOSIS — D649 Anemia, unspecified: Secondary | ICD-10-CM | POA: Diagnosis not present

## 2016-03-21 DIAGNOSIS — S81802A Unspecified open wound, left lower leg, initial encounter: Secondary | ICD-10-CM | POA: Diagnosis not present

## 2016-03-21 DIAGNOSIS — R600 Localized edema: Secondary | ICD-10-CM | POA: Diagnosis not present

## 2016-03-21 DIAGNOSIS — M810 Age-related osteoporosis without current pathological fracture: Secondary | ICD-10-CM | POA: Diagnosis not present

## 2016-03-21 DIAGNOSIS — E559 Vitamin D deficiency, unspecified: Secondary | ICD-10-CM | POA: Diagnosis not present

## 2016-03-22 DIAGNOSIS — S81802D Unspecified open wound, left lower leg, subsequent encounter: Secondary | ICD-10-CM | POA: Diagnosis not present

## 2016-03-22 DIAGNOSIS — S81802A Unspecified open wound, left lower leg, initial encounter: Secondary | ICD-10-CM | POA: Diagnosis not present

## 2016-03-22 DIAGNOSIS — F0280 Dementia in other diseases classified elsewhere without behavioral disturbance: Secondary | ICD-10-CM | POA: Diagnosis not present

## 2016-03-22 DIAGNOSIS — M6281 Muscle weakness (generalized): Secondary | ICD-10-CM | POA: Diagnosis not present

## 2016-03-22 DIAGNOSIS — M810 Age-related osteoporosis without current pathological fracture: Secondary | ICD-10-CM | POA: Diagnosis not present

## 2016-03-22 DIAGNOSIS — G309 Alzheimer's disease, unspecified: Secondary | ICD-10-CM | POA: Diagnosis not present

## 2016-03-22 DIAGNOSIS — I10 Essential (primary) hypertension: Secondary | ICD-10-CM | POA: Diagnosis not present

## 2016-03-24 DIAGNOSIS — I10 Essential (primary) hypertension: Secondary | ICD-10-CM | POA: Diagnosis not present

## 2016-03-24 DIAGNOSIS — S81802D Unspecified open wound, left lower leg, subsequent encounter: Secondary | ICD-10-CM | POA: Diagnosis not present

## 2016-03-24 DIAGNOSIS — G309 Alzheimer's disease, unspecified: Secondary | ICD-10-CM | POA: Diagnosis not present

## 2016-03-24 DIAGNOSIS — F0280 Dementia in other diseases classified elsewhere without behavioral disturbance: Secondary | ICD-10-CM | POA: Diagnosis not present

## 2016-03-24 DIAGNOSIS — M810 Age-related osteoporosis without current pathological fracture: Secondary | ICD-10-CM | POA: Diagnosis not present

## 2016-03-24 DIAGNOSIS — M6281 Muscle weakness (generalized): Secondary | ICD-10-CM | POA: Diagnosis not present

## 2016-03-25 DIAGNOSIS — M810 Age-related osteoporosis without current pathological fracture: Secondary | ICD-10-CM | POA: Diagnosis not present

## 2016-03-25 DIAGNOSIS — M6281 Muscle weakness (generalized): Secondary | ICD-10-CM | POA: Diagnosis not present

## 2016-03-25 DIAGNOSIS — F0280 Dementia in other diseases classified elsewhere without behavioral disturbance: Secondary | ICD-10-CM | POA: Diagnosis not present

## 2016-03-25 DIAGNOSIS — G309 Alzheimer's disease, unspecified: Secondary | ICD-10-CM | POA: Diagnosis not present

## 2016-03-25 DIAGNOSIS — I10 Essential (primary) hypertension: Secondary | ICD-10-CM | POA: Diagnosis not present

## 2016-03-25 DIAGNOSIS — S81802D Unspecified open wound, left lower leg, subsequent encounter: Secondary | ICD-10-CM | POA: Diagnosis not present

## 2016-03-29 DIAGNOSIS — S81802D Unspecified open wound, left lower leg, subsequent encounter: Secondary | ICD-10-CM | POA: Diagnosis not present

## 2016-03-29 DIAGNOSIS — I10 Essential (primary) hypertension: Secondary | ICD-10-CM | POA: Diagnosis not present

## 2016-03-29 DIAGNOSIS — F0280 Dementia in other diseases classified elsewhere without behavioral disturbance: Secondary | ICD-10-CM | POA: Diagnosis not present

## 2016-03-29 DIAGNOSIS — M6281 Muscle weakness (generalized): Secondary | ICD-10-CM | POA: Diagnosis not present

## 2016-03-29 DIAGNOSIS — M810 Age-related osteoporosis without current pathological fracture: Secondary | ICD-10-CM | POA: Diagnosis not present

## 2016-03-29 DIAGNOSIS — G309 Alzheimer's disease, unspecified: Secondary | ICD-10-CM | POA: Diagnosis not present

## 2016-03-30 DIAGNOSIS — I6789 Other cerebrovascular disease: Secondary | ICD-10-CM | POA: Diagnosis not present

## 2016-03-30 DIAGNOSIS — S7224XS Nondisplaced subtrochanteric fracture of right femur, sequela: Secondary | ICD-10-CM | POA: Diagnosis not present

## 2016-03-30 DIAGNOSIS — M6281 Muscle weakness (generalized): Secondary | ICD-10-CM | POA: Diagnosis not present

## 2016-03-30 DIAGNOSIS — R269 Unspecified abnormalities of gait and mobility: Secondary | ICD-10-CM | POA: Diagnosis not present

## 2016-03-30 DIAGNOSIS — I10 Essential (primary) hypertension: Secondary | ICD-10-CM | POA: Diagnosis not present

## 2016-03-31 DIAGNOSIS — G309 Alzheimer's disease, unspecified: Secondary | ICD-10-CM | POA: Diagnosis not present

## 2016-03-31 DIAGNOSIS — F0280 Dementia in other diseases classified elsewhere without behavioral disturbance: Secondary | ICD-10-CM | POA: Diagnosis not present

## 2016-03-31 DIAGNOSIS — S81802D Unspecified open wound, left lower leg, subsequent encounter: Secondary | ICD-10-CM | POA: Diagnosis not present

## 2016-03-31 DIAGNOSIS — M6281 Muscle weakness (generalized): Secondary | ICD-10-CM | POA: Diagnosis not present

## 2016-03-31 DIAGNOSIS — M810 Age-related osteoporosis without current pathological fracture: Secondary | ICD-10-CM | POA: Diagnosis not present

## 2016-03-31 DIAGNOSIS — I10 Essential (primary) hypertension: Secondary | ICD-10-CM | POA: Diagnosis not present

## 2016-04-01 DIAGNOSIS — M6281 Muscle weakness (generalized): Secondary | ICD-10-CM | POA: Diagnosis not present

## 2016-04-01 DIAGNOSIS — F0280 Dementia in other diseases classified elsewhere without behavioral disturbance: Secondary | ICD-10-CM | POA: Diagnosis not present

## 2016-04-01 DIAGNOSIS — M810 Age-related osteoporosis without current pathological fracture: Secondary | ICD-10-CM | POA: Diagnosis not present

## 2016-04-01 DIAGNOSIS — G309 Alzheimer's disease, unspecified: Secondary | ICD-10-CM | POA: Diagnosis not present

## 2016-04-01 DIAGNOSIS — S81802D Unspecified open wound, left lower leg, subsequent encounter: Secondary | ICD-10-CM | POA: Diagnosis not present

## 2016-04-01 DIAGNOSIS — I10 Essential (primary) hypertension: Secondary | ICD-10-CM | POA: Diagnosis not present

## 2016-04-04 DIAGNOSIS — M810 Age-related osteoporosis without current pathological fracture: Secondary | ICD-10-CM | POA: Diagnosis not present

## 2016-04-04 DIAGNOSIS — I10 Essential (primary) hypertension: Secondary | ICD-10-CM | POA: Diagnosis not present

## 2016-04-04 DIAGNOSIS — M6281 Muscle weakness (generalized): Secondary | ICD-10-CM | POA: Diagnosis not present

## 2016-04-04 DIAGNOSIS — F0280 Dementia in other diseases classified elsewhere without behavioral disturbance: Secondary | ICD-10-CM | POA: Diagnosis not present

## 2016-04-04 DIAGNOSIS — G309 Alzheimer's disease, unspecified: Secondary | ICD-10-CM | POA: Diagnosis not present

## 2016-04-04 DIAGNOSIS — S81802D Unspecified open wound, left lower leg, subsequent encounter: Secondary | ICD-10-CM | POA: Diagnosis not present

## 2016-04-05 DIAGNOSIS — E559 Vitamin D deficiency, unspecified: Secondary | ICD-10-CM | POA: Diagnosis not present

## 2016-04-05 DIAGNOSIS — G309 Alzheimer's disease, unspecified: Secondary | ICD-10-CM | POA: Diagnosis not present

## 2016-04-05 DIAGNOSIS — S81802A Unspecified open wound, left lower leg, initial encounter: Secondary | ICD-10-CM | POA: Diagnosis not present

## 2016-04-05 DIAGNOSIS — F0280 Dementia in other diseases classified elsewhere without behavioral disturbance: Secondary | ICD-10-CM | POA: Diagnosis not present

## 2016-04-05 DIAGNOSIS — M810 Age-related osteoporosis without current pathological fracture: Secondary | ICD-10-CM | POA: Diagnosis not present

## 2016-04-05 DIAGNOSIS — R600 Localized edema: Secondary | ICD-10-CM | POA: Diagnosis not present

## 2016-04-05 DIAGNOSIS — D649 Anemia, unspecified: Secondary | ICD-10-CM | POA: Diagnosis not present

## 2016-04-05 DIAGNOSIS — R112 Nausea with vomiting, unspecified: Secondary | ICD-10-CM | POA: Diagnosis not present

## 2016-04-05 DIAGNOSIS — I10 Essential (primary) hypertension: Secondary | ICD-10-CM | POA: Diagnosis not present

## 2016-04-05 DIAGNOSIS — M6281 Muscle weakness (generalized): Secondary | ICD-10-CM | POA: Diagnosis not present

## 2016-04-05 DIAGNOSIS — K219 Gastro-esophageal reflux disease without esophagitis: Secondary | ICD-10-CM | POA: Diagnosis not present

## 2016-04-05 DIAGNOSIS — S81802D Unspecified open wound, left lower leg, subsequent encounter: Secondary | ICD-10-CM | POA: Diagnosis not present

## 2016-04-05 DIAGNOSIS — I639 Cerebral infarction, unspecified: Secondary | ICD-10-CM | POA: Diagnosis not present

## 2016-04-07 DIAGNOSIS — F0280 Dementia in other diseases classified elsewhere without behavioral disturbance: Secondary | ICD-10-CM | POA: Diagnosis not present

## 2016-04-07 DIAGNOSIS — G309 Alzheimer's disease, unspecified: Secondary | ICD-10-CM | POA: Diagnosis not present

## 2016-04-07 DIAGNOSIS — I10 Essential (primary) hypertension: Secondary | ICD-10-CM | POA: Diagnosis not present

## 2016-04-07 DIAGNOSIS — M6281 Muscle weakness (generalized): Secondary | ICD-10-CM | POA: Diagnosis not present

## 2016-04-07 DIAGNOSIS — M810 Age-related osteoporosis without current pathological fracture: Secondary | ICD-10-CM | POA: Diagnosis not present

## 2016-04-07 DIAGNOSIS — S81802D Unspecified open wound, left lower leg, subsequent encounter: Secondary | ICD-10-CM | POA: Diagnosis not present

## 2016-04-11 DIAGNOSIS — G309 Alzheimer's disease, unspecified: Secondary | ICD-10-CM | POA: Diagnosis not present

## 2016-04-11 DIAGNOSIS — R531 Weakness: Secondary | ICD-10-CM | POA: Diagnosis not present

## 2016-04-11 DIAGNOSIS — F0280 Dementia in other diseases classified elsewhere without behavioral disturbance: Secondary | ICD-10-CM | POA: Diagnosis not present

## 2016-04-11 DIAGNOSIS — I10 Essential (primary) hypertension: Secondary | ICD-10-CM | POA: Diagnosis not present

## 2016-04-11 DIAGNOSIS — S81812D Laceration without foreign body, left lower leg, subsequent encounter: Secondary | ICD-10-CM | POA: Diagnosis not present

## 2016-04-11 DIAGNOSIS — M810 Age-related osteoporosis without current pathological fracture: Secondary | ICD-10-CM | POA: Diagnosis not present

## 2016-04-14 DIAGNOSIS — I10 Essential (primary) hypertension: Secondary | ICD-10-CM | POA: Diagnosis not present

## 2016-04-14 DIAGNOSIS — G309 Alzheimer's disease, unspecified: Secondary | ICD-10-CM | POA: Diagnosis not present

## 2016-04-14 DIAGNOSIS — S81812D Laceration without foreign body, left lower leg, subsequent encounter: Secondary | ICD-10-CM | POA: Diagnosis not present

## 2016-04-14 DIAGNOSIS — R531 Weakness: Secondary | ICD-10-CM | POA: Diagnosis not present

## 2016-04-14 DIAGNOSIS — M810 Age-related osteoporosis without current pathological fracture: Secondary | ICD-10-CM | POA: Diagnosis not present

## 2016-04-14 DIAGNOSIS — F0280 Dementia in other diseases classified elsewhere without behavioral disturbance: Secondary | ICD-10-CM | POA: Diagnosis not present

## 2016-04-15 DIAGNOSIS — L603 Nail dystrophy: Secondary | ICD-10-CM | POA: Diagnosis not present

## 2016-04-15 DIAGNOSIS — F0150 Vascular dementia without behavioral disturbance: Secondary | ICD-10-CM | POA: Diagnosis not present

## 2016-04-15 DIAGNOSIS — M2010 Hallux valgus (acquired), unspecified foot: Secondary | ICD-10-CM | POA: Diagnosis not present

## 2016-04-15 DIAGNOSIS — Q845 Enlarged and hypertrophic nails: Secondary | ICD-10-CM | POA: Diagnosis not present

## 2016-04-15 DIAGNOSIS — B351 Tinea unguium: Secondary | ICD-10-CM | POA: Diagnosis not present

## 2016-04-15 DIAGNOSIS — R600 Localized edema: Secondary | ICD-10-CM | POA: Diagnosis not present

## 2016-04-18 DIAGNOSIS — M810 Age-related osteoporosis without current pathological fracture: Secondary | ICD-10-CM | POA: Diagnosis not present

## 2016-04-18 DIAGNOSIS — S81812D Laceration without foreign body, left lower leg, subsequent encounter: Secondary | ICD-10-CM | POA: Diagnosis not present

## 2016-04-18 DIAGNOSIS — G309 Alzheimer's disease, unspecified: Secondary | ICD-10-CM | POA: Diagnosis not present

## 2016-04-18 DIAGNOSIS — I10 Essential (primary) hypertension: Secondary | ICD-10-CM | POA: Diagnosis not present

## 2016-04-18 DIAGNOSIS — F0280 Dementia in other diseases classified elsewhere without behavioral disturbance: Secondary | ICD-10-CM | POA: Diagnosis not present

## 2016-04-18 DIAGNOSIS — R531 Weakness: Secondary | ICD-10-CM | POA: Diagnosis not present

## 2016-04-19 DIAGNOSIS — I10 Essential (primary) hypertension: Secondary | ICD-10-CM | POA: Diagnosis not present

## 2016-04-19 DIAGNOSIS — G894 Chronic pain syndrome: Secondary | ICD-10-CM | POA: Diagnosis not present

## 2016-04-19 DIAGNOSIS — S81812D Laceration without foreign body, left lower leg, subsequent encounter: Secondary | ICD-10-CM | POA: Diagnosis not present

## 2016-04-19 DIAGNOSIS — G309 Alzheimer's disease, unspecified: Secondary | ICD-10-CM | POA: Diagnosis not present

## 2016-04-19 DIAGNOSIS — M810 Age-related osteoporosis without current pathological fracture: Secondary | ICD-10-CM | POA: Diagnosis not present

## 2016-04-19 DIAGNOSIS — F0280 Dementia in other diseases classified elsewhere without behavioral disturbance: Secondary | ICD-10-CM | POA: Diagnosis not present

## 2016-04-19 DIAGNOSIS — R531 Weakness: Secondary | ICD-10-CM | POA: Diagnosis not present

## 2016-04-21 DIAGNOSIS — G309 Alzheimer's disease, unspecified: Secondary | ICD-10-CM | POA: Diagnosis not present

## 2016-04-21 DIAGNOSIS — F0280 Dementia in other diseases classified elsewhere without behavioral disturbance: Secondary | ICD-10-CM | POA: Diagnosis not present

## 2016-04-21 DIAGNOSIS — I10 Essential (primary) hypertension: Secondary | ICD-10-CM | POA: Diagnosis not present

## 2016-04-21 DIAGNOSIS — M810 Age-related osteoporosis without current pathological fracture: Secondary | ICD-10-CM | POA: Diagnosis not present

## 2016-04-21 DIAGNOSIS — S81812D Laceration without foreign body, left lower leg, subsequent encounter: Secondary | ICD-10-CM | POA: Diagnosis not present

## 2016-04-21 DIAGNOSIS — R531 Weakness: Secondary | ICD-10-CM | POA: Diagnosis not present

## 2016-04-25 DIAGNOSIS — R531 Weakness: Secondary | ICD-10-CM | POA: Diagnosis not present

## 2016-04-25 DIAGNOSIS — F0280 Dementia in other diseases classified elsewhere without behavioral disturbance: Secondary | ICD-10-CM | POA: Diagnosis not present

## 2016-04-25 DIAGNOSIS — G309 Alzheimer's disease, unspecified: Secondary | ICD-10-CM | POA: Diagnosis not present

## 2016-04-25 DIAGNOSIS — I10 Essential (primary) hypertension: Secondary | ICD-10-CM | POA: Diagnosis not present

## 2016-04-25 DIAGNOSIS — M810 Age-related osteoporosis without current pathological fracture: Secondary | ICD-10-CM | POA: Diagnosis not present

## 2016-04-25 DIAGNOSIS — S81812D Laceration without foreign body, left lower leg, subsequent encounter: Secondary | ICD-10-CM | POA: Diagnosis not present

## 2016-04-26 DIAGNOSIS — I10 Essential (primary) hypertension: Secondary | ICD-10-CM | POA: Diagnosis not present

## 2016-04-26 DIAGNOSIS — R531 Weakness: Secondary | ICD-10-CM | POA: Diagnosis not present

## 2016-04-26 DIAGNOSIS — S81812D Laceration without foreign body, left lower leg, subsequent encounter: Secondary | ICD-10-CM | POA: Diagnosis not present

## 2016-04-26 DIAGNOSIS — G309 Alzheimer's disease, unspecified: Secondary | ICD-10-CM | POA: Diagnosis not present

## 2016-04-26 DIAGNOSIS — M810 Age-related osteoporosis without current pathological fracture: Secondary | ICD-10-CM | POA: Diagnosis not present

## 2016-04-26 DIAGNOSIS — F0280 Dementia in other diseases classified elsewhere without behavioral disturbance: Secondary | ICD-10-CM | POA: Diagnosis not present

## 2016-04-27 DIAGNOSIS — M810 Age-related osteoporosis without current pathological fracture: Secondary | ICD-10-CM | POA: Diagnosis not present

## 2016-04-27 DIAGNOSIS — S81812D Laceration without foreign body, left lower leg, subsequent encounter: Secondary | ICD-10-CM | POA: Diagnosis not present

## 2016-04-27 DIAGNOSIS — F0280 Dementia in other diseases classified elsewhere without behavioral disturbance: Secondary | ICD-10-CM | POA: Diagnosis not present

## 2016-04-27 DIAGNOSIS — R531 Weakness: Secondary | ICD-10-CM | POA: Diagnosis not present

## 2016-04-27 DIAGNOSIS — I10 Essential (primary) hypertension: Secondary | ICD-10-CM | POA: Diagnosis not present

## 2016-04-27 DIAGNOSIS — G309 Alzheimer's disease, unspecified: Secondary | ICD-10-CM | POA: Diagnosis not present

## 2016-04-29 DIAGNOSIS — R269 Unspecified abnormalities of gait and mobility: Secondary | ICD-10-CM | POA: Diagnosis not present

## 2016-04-29 DIAGNOSIS — M6281 Muscle weakness (generalized): Secondary | ICD-10-CM | POA: Diagnosis not present

## 2016-04-29 DIAGNOSIS — I6789 Other cerebrovascular disease: Secondary | ICD-10-CM | POA: Diagnosis not present

## 2016-04-29 DIAGNOSIS — I10 Essential (primary) hypertension: Secondary | ICD-10-CM | POA: Diagnosis not present

## 2016-04-29 DIAGNOSIS — S7224XS Nondisplaced subtrochanteric fracture of right femur, sequela: Secondary | ICD-10-CM | POA: Diagnosis not present

## 2016-05-03 DIAGNOSIS — F0280 Dementia in other diseases classified elsewhere without behavioral disturbance: Secondary | ICD-10-CM | POA: Diagnosis not present

## 2016-05-03 DIAGNOSIS — R112 Nausea with vomiting, unspecified: Secondary | ICD-10-CM | POA: Diagnosis not present

## 2016-05-03 DIAGNOSIS — E559 Vitamin D deficiency, unspecified: Secondary | ICD-10-CM | POA: Diagnosis not present

## 2016-05-03 DIAGNOSIS — R531 Weakness: Secondary | ICD-10-CM | POA: Diagnosis not present

## 2016-05-03 DIAGNOSIS — G309 Alzheimer's disease, unspecified: Secondary | ICD-10-CM | POA: Diagnosis not present

## 2016-05-03 DIAGNOSIS — I10 Essential (primary) hypertension: Secondary | ICD-10-CM | POA: Diagnosis not present

## 2016-05-03 DIAGNOSIS — D649 Anemia, unspecified: Secondary | ICD-10-CM | POA: Diagnosis not present

## 2016-05-03 DIAGNOSIS — M810 Age-related osteoporosis without current pathological fracture: Secondary | ICD-10-CM | POA: Diagnosis not present

## 2016-05-03 DIAGNOSIS — K219 Gastro-esophageal reflux disease without esophagitis: Secondary | ICD-10-CM | POA: Diagnosis not present

## 2016-05-03 DIAGNOSIS — R600 Localized edema: Secondary | ICD-10-CM | POA: Diagnosis not present

## 2016-05-03 DIAGNOSIS — I639 Cerebral infarction, unspecified: Secondary | ICD-10-CM | POA: Diagnosis not present

## 2016-05-03 DIAGNOSIS — S81802A Unspecified open wound, left lower leg, initial encounter: Secondary | ICD-10-CM | POA: Diagnosis not present

## 2016-05-03 DIAGNOSIS — S81812D Laceration without foreign body, left lower leg, subsequent encounter: Secondary | ICD-10-CM | POA: Diagnosis not present

## 2016-05-05 DIAGNOSIS — R531 Weakness: Secondary | ICD-10-CM | POA: Diagnosis not present

## 2016-05-05 DIAGNOSIS — F0280 Dementia in other diseases classified elsewhere without behavioral disturbance: Secondary | ICD-10-CM | POA: Diagnosis not present

## 2016-05-05 DIAGNOSIS — I10 Essential (primary) hypertension: Secondary | ICD-10-CM | POA: Diagnosis not present

## 2016-05-05 DIAGNOSIS — M810 Age-related osteoporosis without current pathological fracture: Secondary | ICD-10-CM | POA: Diagnosis not present

## 2016-05-05 DIAGNOSIS — S81812D Laceration without foreign body, left lower leg, subsequent encounter: Secondary | ICD-10-CM | POA: Diagnosis not present

## 2016-05-05 DIAGNOSIS — G309 Alzheimer's disease, unspecified: Secondary | ICD-10-CM | POA: Diagnosis not present

## 2016-05-06 DIAGNOSIS — E559 Vitamin D deficiency, unspecified: Secondary | ICD-10-CM | POA: Diagnosis not present

## 2016-05-06 DIAGNOSIS — D649 Anemia, unspecified: Secondary | ICD-10-CM | POA: Diagnosis not present

## 2016-05-06 DIAGNOSIS — I10 Essential (primary) hypertension: Secondary | ICD-10-CM | POA: Diagnosis not present

## 2016-05-09 DIAGNOSIS — I10 Essential (primary) hypertension: Secondary | ICD-10-CM | POA: Diagnosis not present

## 2016-05-09 DIAGNOSIS — S81812D Laceration without foreign body, left lower leg, subsequent encounter: Secondary | ICD-10-CM | POA: Diagnosis not present

## 2016-05-09 DIAGNOSIS — G309 Alzheimer's disease, unspecified: Secondary | ICD-10-CM | POA: Diagnosis not present

## 2016-05-09 DIAGNOSIS — R531 Weakness: Secondary | ICD-10-CM | POA: Diagnosis not present

## 2016-05-09 DIAGNOSIS — M810 Age-related osteoporosis without current pathological fracture: Secondary | ICD-10-CM | POA: Diagnosis not present

## 2016-05-09 DIAGNOSIS — F0280 Dementia in other diseases classified elsewhere without behavioral disturbance: Secondary | ICD-10-CM | POA: Diagnosis not present

## 2016-05-10 DIAGNOSIS — G309 Alzheimer's disease, unspecified: Secondary | ICD-10-CM | POA: Diagnosis not present

## 2016-05-10 DIAGNOSIS — S41102D Unspecified open wound of left upper arm, subsequent encounter: Secondary | ICD-10-CM | POA: Diagnosis not present

## 2016-05-10 DIAGNOSIS — M810 Age-related osteoporosis without current pathological fracture: Secondary | ICD-10-CM | POA: Diagnosis not present

## 2016-05-10 DIAGNOSIS — I10 Essential (primary) hypertension: Secondary | ICD-10-CM | POA: Diagnosis not present

## 2016-05-10 DIAGNOSIS — F0280 Dementia in other diseases classified elsewhere without behavioral disturbance: Secondary | ICD-10-CM | POA: Diagnosis not present

## 2016-05-10 DIAGNOSIS — G894 Chronic pain syndrome: Secondary | ICD-10-CM | POA: Diagnosis not present

## 2016-05-10 DIAGNOSIS — F0150 Vascular dementia without behavioral disturbance: Secondary | ICD-10-CM | POA: Diagnosis not present

## 2016-05-10 DIAGNOSIS — R531 Weakness: Secondary | ICD-10-CM | POA: Diagnosis not present

## 2016-05-10 DIAGNOSIS — S81812D Laceration without foreign body, left lower leg, subsequent encounter: Secondary | ICD-10-CM | POA: Diagnosis not present

## 2016-05-12 DIAGNOSIS — F0280 Dementia in other diseases classified elsewhere without behavioral disturbance: Secondary | ICD-10-CM | POA: Diagnosis not present

## 2016-05-12 DIAGNOSIS — M810 Age-related osteoporosis without current pathological fracture: Secondary | ICD-10-CM | POA: Diagnosis not present

## 2016-05-12 DIAGNOSIS — S41102D Unspecified open wound of left upper arm, subsequent encounter: Secondary | ICD-10-CM | POA: Diagnosis not present

## 2016-05-12 DIAGNOSIS — G309 Alzheimer's disease, unspecified: Secondary | ICD-10-CM | POA: Diagnosis not present

## 2016-05-12 DIAGNOSIS — S81812D Laceration without foreign body, left lower leg, subsequent encounter: Secondary | ICD-10-CM | POA: Diagnosis not present

## 2016-05-12 DIAGNOSIS — R531 Weakness: Secondary | ICD-10-CM | POA: Diagnosis not present

## 2016-05-12 DIAGNOSIS — I10 Essential (primary) hypertension: Secondary | ICD-10-CM | POA: Diagnosis not present

## 2016-05-16 DIAGNOSIS — S41102D Unspecified open wound of left upper arm, subsequent encounter: Secondary | ICD-10-CM | POA: Diagnosis not present

## 2016-05-16 DIAGNOSIS — M810 Age-related osteoporosis without current pathological fracture: Secondary | ICD-10-CM | POA: Diagnosis not present

## 2016-05-16 DIAGNOSIS — I10 Essential (primary) hypertension: Secondary | ICD-10-CM | POA: Diagnosis not present

## 2016-05-16 DIAGNOSIS — R531 Weakness: Secondary | ICD-10-CM | POA: Diagnosis not present

## 2016-05-16 DIAGNOSIS — G309 Alzheimer's disease, unspecified: Secondary | ICD-10-CM | POA: Diagnosis not present

## 2016-05-16 DIAGNOSIS — F0280 Dementia in other diseases classified elsewhere without behavioral disturbance: Secondary | ICD-10-CM | POA: Diagnosis not present

## 2016-05-16 DIAGNOSIS — S81812D Laceration without foreign body, left lower leg, subsequent encounter: Secondary | ICD-10-CM | POA: Diagnosis not present

## 2016-05-17 DIAGNOSIS — I10 Essential (primary) hypertension: Secondary | ICD-10-CM | POA: Diagnosis not present

## 2016-05-17 DIAGNOSIS — G309 Alzheimer's disease, unspecified: Secondary | ICD-10-CM | POA: Diagnosis not present

## 2016-05-17 DIAGNOSIS — M810 Age-related osteoporosis without current pathological fracture: Secondary | ICD-10-CM | POA: Diagnosis not present

## 2016-05-17 DIAGNOSIS — S41102D Unspecified open wound of left upper arm, subsequent encounter: Secondary | ICD-10-CM | POA: Diagnosis not present

## 2016-05-17 DIAGNOSIS — R531 Weakness: Secondary | ICD-10-CM | POA: Diagnosis not present

## 2016-05-17 DIAGNOSIS — F0280 Dementia in other diseases classified elsewhere without behavioral disturbance: Secondary | ICD-10-CM | POA: Diagnosis not present

## 2016-05-17 DIAGNOSIS — S81812D Laceration without foreign body, left lower leg, subsequent encounter: Secondary | ICD-10-CM | POA: Diagnosis not present

## 2016-05-19 DIAGNOSIS — I10 Essential (primary) hypertension: Secondary | ICD-10-CM | POA: Diagnosis not present

## 2016-05-19 DIAGNOSIS — M810 Age-related osteoporosis without current pathological fracture: Secondary | ICD-10-CM | POA: Diagnosis not present

## 2016-05-19 DIAGNOSIS — F0280 Dementia in other diseases classified elsewhere without behavioral disturbance: Secondary | ICD-10-CM | POA: Diagnosis not present

## 2016-05-19 DIAGNOSIS — R531 Weakness: Secondary | ICD-10-CM | POA: Diagnosis not present

## 2016-05-19 DIAGNOSIS — S41102D Unspecified open wound of left upper arm, subsequent encounter: Secondary | ICD-10-CM | POA: Diagnosis not present

## 2016-05-19 DIAGNOSIS — S81812D Laceration without foreign body, left lower leg, subsequent encounter: Secondary | ICD-10-CM | POA: Diagnosis not present

## 2016-05-19 DIAGNOSIS — G309 Alzheimer's disease, unspecified: Secondary | ICD-10-CM | POA: Diagnosis not present

## 2016-05-23 DIAGNOSIS — G309 Alzheimer's disease, unspecified: Secondary | ICD-10-CM | POA: Diagnosis not present

## 2016-05-23 DIAGNOSIS — S81812D Laceration without foreign body, left lower leg, subsequent encounter: Secondary | ICD-10-CM | POA: Diagnosis not present

## 2016-05-23 DIAGNOSIS — F0280 Dementia in other diseases classified elsewhere without behavioral disturbance: Secondary | ICD-10-CM | POA: Diagnosis not present

## 2016-05-23 DIAGNOSIS — I10 Essential (primary) hypertension: Secondary | ICD-10-CM | POA: Diagnosis not present

## 2016-05-23 DIAGNOSIS — M810 Age-related osteoporosis without current pathological fracture: Secondary | ICD-10-CM | POA: Diagnosis not present

## 2016-05-23 DIAGNOSIS — R531 Weakness: Secondary | ICD-10-CM | POA: Diagnosis not present

## 2016-05-23 DIAGNOSIS — S41102D Unspecified open wound of left upper arm, subsequent encounter: Secondary | ICD-10-CM | POA: Diagnosis not present

## 2016-05-24 DIAGNOSIS — I10 Essential (primary) hypertension: Secondary | ICD-10-CM | POA: Diagnosis not present

## 2016-05-24 DIAGNOSIS — S41102D Unspecified open wound of left upper arm, subsequent encounter: Secondary | ICD-10-CM | POA: Diagnosis not present

## 2016-05-24 DIAGNOSIS — R531 Weakness: Secondary | ICD-10-CM | POA: Diagnosis not present

## 2016-05-24 DIAGNOSIS — S81812D Laceration without foreign body, left lower leg, subsequent encounter: Secondary | ICD-10-CM | POA: Diagnosis not present

## 2016-05-24 DIAGNOSIS — M810 Age-related osteoporosis without current pathological fracture: Secondary | ICD-10-CM | POA: Diagnosis not present

## 2016-05-24 DIAGNOSIS — F0280 Dementia in other diseases classified elsewhere without behavioral disturbance: Secondary | ICD-10-CM | POA: Diagnosis not present

## 2016-05-24 DIAGNOSIS — G309 Alzheimer's disease, unspecified: Secondary | ICD-10-CM | POA: Diagnosis not present

## 2016-05-24 DIAGNOSIS — F0150 Vascular dementia without behavioral disturbance: Secondary | ICD-10-CM | POA: Diagnosis not present

## 2016-05-26 DIAGNOSIS — M810 Age-related osteoporosis without current pathological fracture: Secondary | ICD-10-CM | POA: Diagnosis not present

## 2016-05-26 DIAGNOSIS — F0280 Dementia in other diseases classified elsewhere without behavioral disturbance: Secondary | ICD-10-CM | POA: Diagnosis not present

## 2016-05-26 DIAGNOSIS — G309 Alzheimer's disease, unspecified: Secondary | ICD-10-CM | POA: Diagnosis not present

## 2016-05-26 DIAGNOSIS — S81812D Laceration without foreign body, left lower leg, subsequent encounter: Secondary | ICD-10-CM | POA: Diagnosis not present

## 2016-05-26 DIAGNOSIS — R531 Weakness: Secondary | ICD-10-CM | POA: Diagnosis not present

## 2016-05-26 DIAGNOSIS — S41102D Unspecified open wound of left upper arm, subsequent encounter: Secondary | ICD-10-CM | POA: Diagnosis not present

## 2016-05-26 DIAGNOSIS — I10 Essential (primary) hypertension: Secondary | ICD-10-CM | POA: Diagnosis not present

## 2016-05-30 DIAGNOSIS — M6281 Muscle weakness (generalized): Secondary | ICD-10-CM | POA: Diagnosis not present

## 2016-05-30 DIAGNOSIS — I10 Essential (primary) hypertension: Secondary | ICD-10-CM | POA: Diagnosis not present

## 2016-05-30 DIAGNOSIS — R269 Unspecified abnormalities of gait and mobility: Secondary | ICD-10-CM | POA: Diagnosis not present

## 2016-05-30 DIAGNOSIS — S7224XS Nondisplaced subtrochanteric fracture of right femur, sequela: Secondary | ICD-10-CM | POA: Diagnosis not present

## 2016-05-30 DIAGNOSIS — I6789 Other cerebrovascular disease: Secondary | ICD-10-CM | POA: Diagnosis not present

## 2016-05-31 DIAGNOSIS — R600 Localized edema: Secondary | ICD-10-CM | POA: Diagnosis not present

## 2016-05-31 DIAGNOSIS — S81812D Laceration without foreign body, left lower leg, subsequent encounter: Secondary | ICD-10-CM | POA: Diagnosis not present

## 2016-05-31 DIAGNOSIS — S81802A Unspecified open wound, left lower leg, initial encounter: Secondary | ICD-10-CM | POA: Diagnosis not present

## 2016-05-31 DIAGNOSIS — S41102D Unspecified open wound of left upper arm, subsequent encounter: Secondary | ICD-10-CM | POA: Diagnosis not present

## 2016-05-31 DIAGNOSIS — M810 Age-related osteoporosis without current pathological fracture: Secondary | ICD-10-CM | POA: Diagnosis not present

## 2016-05-31 DIAGNOSIS — I639 Cerebral infarction, unspecified: Secondary | ICD-10-CM | POA: Diagnosis not present

## 2016-05-31 DIAGNOSIS — K219 Gastro-esophageal reflux disease without esophagitis: Secondary | ICD-10-CM | POA: Diagnosis not present

## 2016-05-31 DIAGNOSIS — R531 Weakness: Secondary | ICD-10-CM | POA: Diagnosis not present

## 2016-05-31 DIAGNOSIS — G309 Alzheimer's disease, unspecified: Secondary | ICD-10-CM | POA: Diagnosis not present

## 2016-05-31 DIAGNOSIS — D649 Anemia, unspecified: Secondary | ICD-10-CM | POA: Diagnosis not present

## 2016-05-31 DIAGNOSIS — E559 Vitamin D deficiency, unspecified: Secondary | ICD-10-CM | POA: Diagnosis not present

## 2016-05-31 DIAGNOSIS — F0280 Dementia in other diseases classified elsewhere without behavioral disturbance: Secondary | ICD-10-CM | POA: Diagnosis not present

## 2016-05-31 DIAGNOSIS — R112 Nausea with vomiting, unspecified: Secondary | ICD-10-CM | POA: Diagnosis not present

## 2016-05-31 DIAGNOSIS — I10 Essential (primary) hypertension: Secondary | ICD-10-CM | POA: Diagnosis not present

## 2016-06-02 DIAGNOSIS — M810 Age-related osteoporosis without current pathological fracture: Secondary | ICD-10-CM | POA: Diagnosis not present

## 2016-06-02 DIAGNOSIS — S41102D Unspecified open wound of left upper arm, subsequent encounter: Secondary | ICD-10-CM | POA: Diagnosis not present

## 2016-06-02 DIAGNOSIS — S81812D Laceration without foreign body, left lower leg, subsequent encounter: Secondary | ICD-10-CM | POA: Diagnosis not present

## 2016-06-02 DIAGNOSIS — F0280 Dementia in other diseases classified elsewhere without behavioral disturbance: Secondary | ICD-10-CM | POA: Diagnosis not present

## 2016-06-02 DIAGNOSIS — I10 Essential (primary) hypertension: Secondary | ICD-10-CM | POA: Diagnosis not present

## 2016-06-02 DIAGNOSIS — G309 Alzheimer's disease, unspecified: Secondary | ICD-10-CM | POA: Diagnosis not present

## 2016-06-02 DIAGNOSIS — R531 Weakness: Secondary | ICD-10-CM | POA: Diagnosis not present

## 2016-06-03 DIAGNOSIS — S81812D Laceration without foreign body, left lower leg, subsequent encounter: Secondary | ICD-10-CM | POA: Diagnosis not present

## 2016-06-03 DIAGNOSIS — M810 Age-related osteoporosis without current pathological fracture: Secondary | ICD-10-CM | POA: Diagnosis not present

## 2016-06-03 DIAGNOSIS — I10 Essential (primary) hypertension: Secondary | ICD-10-CM | POA: Diagnosis not present

## 2016-06-03 DIAGNOSIS — R531 Weakness: Secondary | ICD-10-CM | POA: Diagnosis not present

## 2016-06-03 DIAGNOSIS — S41102D Unspecified open wound of left upper arm, subsequent encounter: Secondary | ICD-10-CM | POA: Diagnosis not present

## 2016-06-03 DIAGNOSIS — F0280 Dementia in other diseases classified elsewhere without behavioral disturbance: Secondary | ICD-10-CM | POA: Diagnosis not present

## 2016-06-03 DIAGNOSIS — G309 Alzheimer's disease, unspecified: Secondary | ICD-10-CM | POA: Diagnosis not present

## 2016-06-07 DIAGNOSIS — I10 Essential (primary) hypertension: Secondary | ICD-10-CM | POA: Diagnosis not present

## 2016-06-07 DIAGNOSIS — G309 Alzheimer's disease, unspecified: Secondary | ICD-10-CM | POA: Diagnosis not present

## 2016-06-07 DIAGNOSIS — F0280 Dementia in other diseases classified elsewhere without behavioral disturbance: Secondary | ICD-10-CM | POA: Diagnosis not present

## 2016-06-07 DIAGNOSIS — S81812D Laceration without foreign body, left lower leg, subsequent encounter: Secondary | ICD-10-CM | POA: Diagnosis not present

## 2016-06-07 DIAGNOSIS — S81802A Unspecified open wound, left lower leg, initial encounter: Secondary | ICD-10-CM | POA: Diagnosis not present

## 2016-06-07 DIAGNOSIS — S41102D Unspecified open wound of left upper arm, subsequent encounter: Secondary | ICD-10-CM | POA: Diagnosis not present

## 2016-06-07 DIAGNOSIS — M810 Age-related osteoporosis without current pathological fracture: Secondary | ICD-10-CM | POA: Diagnosis not present

## 2016-06-07 DIAGNOSIS — G894 Chronic pain syndrome: Secondary | ICD-10-CM | POA: Diagnosis not present

## 2016-06-07 DIAGNOSIS — R531 Weakness: Secondary | ICD-10-CM | POA: Diagnosis not present

## 2016-06-09 DIAGNOSIS — G309 Alzheimer's disease, unspecified: Secondary | ICD-10-CM | POA: Diagnosis not present

## 2016-06-09 DIAGNOSIS — F0280 Dementia in other diseases classified elsewhere without behavioral disturbance: Secondary | ICD-10-CM | POA: Diagnosis not present

## 2016-06-09 DIAGNOSIS — R531 Weakness: Secondary | ICD-10-CM | POA: Diagnosis not present

## 2016-06-09 DIAGNOSIS — M810 Age-related osteoporosis without current pathological fracture: Secondary | ICD-10-CM | POA: Diagnosis not present

## 2016-06-09 DIAGNOSIS — I10 Essential (primary) hypertension: Secondary | ICD-10-CM | POA: Diagnosis not present

## 2016-06-09 DIAGNOSIS — S41102D Unspecified open wound of left upper arm, subsequent encounter: Secondary | ICD-10-CM | POA: Diagnosis not present

## 2016-06-09 DIAGNOSIS — S81812D Laceration without foreign body, left lower leg, subsequent encounter: Secondary | ICD-10-CM | POA: Diagnosis not present

## 2016-06-13 DIAGNOSIS — I10 Essential (primary) hypertension: Secondary | ICD-10-CM | POA: Diagnosis not present

## 2016-06-13 DIAGNOSIS — G309 Alzheimer's disease, unspecified: Secondary | ICD-10-CM | POA: Diagnosis not present

## 2016-06-13 DIAGNOSIS — S41102D Unspecified open wound of left upper arm, subsequent encounter: Secondary | ICD-10-CM | POA: Diagnosis not present

## 2016-06-13 DIAGNOSIS — F0280 Dementia in other diseases classified elsewhere without behavioral disturbance: Secondary | ICD-10-CM | POA: Diagnosis not present

## 2016-06-13 DIAGNOSIS — R531 Weakness: Secondary | ICD-10-CM | POA: Diagnosis not present

## 2016-06-13 DIAGNOSIS — S81812D Laceration without foreign body, left lower leg, subsequent encounter: Secondary | ICD-10-CM | POA: Diagnosis not present

## 2016-06-13 DIAGNOSIS — M810 Age-related osteoporosis without current pathological fracture: Secondary | ICD-10-CM | POA: Diagnosis not present

## 2016-06-14 DIAGNOSIS — S41102D Unspecified open wound of left upper arm, subsequent encounter: Secondary | ICD-10-CM | POA: Diagnosis not present

## 2016-06-14 DIAGNOSIS — F0280 Dementia in other diseases classified elsewhere without behavioral disturbance: Secondary | ICD-10-CM | POA: Diagnosis not present

## 2016-06-14 DIAGNOSIS — R531 Weakness: Secondary | ICD-10-CM | POA: Diagnosis not present

## 2016-06-14 DIAGNOSIS — S81812D Laceration without foreign body, left lower leg, subsequent encounter: Secondary | ICD-10-CM | POA: Diagnosis not present

## 2016-06-14 DIAGNOSIS — M810 Age-related osteoporosis without current pathological fracture: Secondary | ICD-10-CM | POA: Diagnosis not present

## 2016-06-14 DIAGNOSIS — I10 Essential (primary) hypertension: Secondary | ICD-10-CM | POA: Diagnosis not present

## 2016-06-14 DIAGNOSIS — G309 Alzheimer's disease, unspecified: Secondary | ICD-10-CM | POA: Diagnosis not present

## 2016-06-15 DIAGNOSIS — M810 Age-related osteoporosis without current pathological fracture: Secondary | ICD-10-CM | POA: Diagnosis not present

## 2016-06-15 DIAGNOSIS — R531 Weakness: Secondary | ICD-10-CM | POA: Diagnosis not present

## 2016-06-15 DIAGNOSIS — G309 Alzheimer's disease, unspecified: Secondary | ICD-10-CM | POA: Diagnosis not present

## 2016-06-15 DIAGNOSIS — F0280 Dementia in other diseases classified elsewhere without behavioral disturbance: Secondary | ICD-10-CM | POA: Diagnosis not present

## 2016-06-15 DIAGNOSIS — I10 Essential (primary) hypertension: Secondary | ICD-10-CM | POA: Diagnosis not present

## 2016-06-15 DIAGNOSIS — S81812D Laceration without foreign body, left lower leg, subsequent encounter: Secondary | ICD-10-CM | POA: Diagnosis not present

## 2016-06-15 DIAGNOSIS — S41102D Unspecified open wound of left upper arm, subsequent encounter: Secondary | ICD-10-CM | POA: Diagnosis not present

## 2016-06-16 DIAGNOSIS — S81812D Laceration without foreign body, left lower leg, subsequent encounter: Secondary | ICD-10-CM | POA: Diagnosis not present

## 2016-06-16 DIAGNOSIS — G309 Alzheimer's disease, unspecified: Secondary | ICD-10-CM | POA: Diagnosis not present

## 2016-06-16 DIAGNOSIS — S41102D Unspecified open wound of left upper arm, subsequent encounter: Secondary | ICD-10-CM | POA: Diagnosis not present

## 2016-06-16 DIAGNOSIS — I10 Essential (primary) hypertension: Secondary | ICD-10-CM | POA: Diagnosis not present

## 2016-06-16 DIAGNOSIS — M810 Age-related osteoporosis without current pathological fracture: Secondary | ICD-10-CM | POA: Diagnosis not present

## 2016-06-16 DIAGNOSIS — R531 Weakness: Secondary | ICD-10-CM | POA: Diagnosis not present

## 2016-06-16 DIAGNOSIS — F0280 Dementia in other diseases classified elsewhere without behavioral disturbance: Secondary | ICD-10-CM | POA: Diagnosis not present

## 2016-06-20 DIAGNOSIS — S81812D Laceration without foreign body, left lower leg, subsequent encounter: Secondary | ICD-10-CM | POA: Diagnosis not present

## 2016-06-20 DIAGNOSIS — I10 Essential (primary) hypertension: Secondary | ICD-10-CM | POA: Diagnosis not present

## 2016-06-20 DIAGNOSIS — F0280 Dementia in other diseases classified elsewhere without behavioral disturbance: Secondary | ICD-10-CM | POA: Diagnosis not present

## 2016-06-20 DIAGNOSIS — R531 Weakness: Secondary | ICD-10-CM | POA: Diagnosis not present

## 2016-06-20 DIAGNOSIS — M810 Age-related osteoporosis without current pathological fracture: Secondary | ICD-10-CM | POA: Diagnosis not present

## 2016-06-20 DIAGNOSIS — S41102D Unspecified open wound of left upper arm, subsequent encounter: Secondary | ICD-10-CM | POA: Diagnosis not present

## 2016-06-20 DIAGNOSIS — G309 Alzheimer's disease, unspecified: Secondary | ICD-10-CM | POA: Diagnosis not present

## 2016-06-21 DIAGNOSIS — F0150 Vascular dementia without behavioral disturbance: Secondary | ICD-10-CM | POA: Diagnosis not present

## 2016-06-21 DIAGNOSIS — F064 Anxiety disorder due to known physiological condition: Secondary | ICD-10-CM | POA: Diagnosis not present

## 2016-06-23 DIAGNOSIS — S41102D Unspecified open wound of left upper arm, subsequent encounter: Secondary | ICD-10-CM | POA: Diagnosis not present

## 2016-06-23 DIAGNOSIS — I10 Essential (primary) hypertension: Secondary | ICD-10-CM | POA: Diagnosis not present

## 2016-06-23 DIAGNOSIS — M810 Age-related osteoporosis without current pathological fracture: Secondary | ICD-10-CM | POA: Diagnosis not present

## 2016-06-23 DIAGNOSIS — G309 Alzheimer's disease, unspecified: Secondary | ICD-10-CM | POA: Diagnosis not present

## 2016-06-23 DIAGNOSIS — F0280 Dementia in other diseases classified elsewhere without behavioral disturbance: Secondary | ICD-10-CM | POA: Diagnosis not present

## 2016-06-23 DIAGNOSIS — S81812D Laceration without foreign body, left lower leg, subsequent encounter: Secondary | ICD-10-CM | POA: Diagnosis not present

## 2016-06-23 DIAGNOSIS — R531 Weakness: Secondary | ICD-10-CM | POA: Diagnosis not present

## 2016-06-27 DIAGNOSIS — I10 Essential (primary) hypertension: Secondary | ICD-10-CM | POA: Diagnosis not present

## 2016-06-27 DIAGNOSIS — S81812D Laceration without foreign body, left lower leg, subsequent encounter: Secondary | ICD-10-CM | POA: Diagnosis not present

## 2016-06-27 DIAGNOSIS — G309 Alzheimer's disease, unspecified: Secondary | ICD-10-CM | POA: Diagnosis not present

## 2016-06-27 DIAGNOSIS — S41102D Unspecified open wound of left upper arm, subsequent encounter: Secondary | ICD-10-CM | POA: Diagnosis not present

## 2016-06-27 DIAGNOSIS — M810 Age-related osteoporosis without current pathological fracture: Secondary | ICD-10-CM | POA: Diagnosis not present

## 2016-06-27 DIAGNOSIS — F0280 Dementia in other diseases classified elsewhere without behavioral disturbance: Secondary | ICD-10-CM | POA: Diagnosis not present

## 2016-06-27 DIAGNOSIS — R531 Weakness: Secondary | ICD-10-CM | POA: Diagnosis not present

## 2016-06-28 DIAGNOSIS — S81802A Unspecified open wound, left lower leg, initial encounter: Secondary | ICD-10-CM | POA: Diagnosis not present

## 2016-06-28 DIAGNOSIS — I10 Essential (primary) hypertension: Secondary | ICD-10-CM | POA: Diagnosis not present

## 2016-06-28 DIAGNOSIS — R600 Localized edema: Secondary | ICD-10-CM | POA: Diagnosis not present

## 2016-06-28 DIAGNOSIS — D649 Anemia, unspecified: Secondary | ICD-10-CM | POA: Diagnosis not present

## 2016-06-28 DIAGNOSIS — E559 Vitamin D deficiency, unspecified: Secondary | ICD-10-CM | POA: Diagnosis not present

## 2016-06-28 DIAGNOSIS — M810 Age-related osteoporosis without current pathological fracture: Secondary | ICD-10-CM | POA: Diagnosis not present

## 2016-06-28 DIAGNOSIS — K219 Gastro-esophageal reflux disease without esophagitis: Secondary | ICD-10-CM | POA: Diagnosis not present

## 2016-06-28 DIAGNOSIS — I639 Cerebral infarction, unspecified: Secondary | ICD-10-CM | POA: Diagnosis not present

## 2016-06-28 DIAGNOSIS — R112 Nausea with vomiting, unspecified: Secondary | ICD-10-CM | POA: Diagnosis not present

## 2016-06-29 DIAGNOSIS — M810 Age-related osteoporosis without current pathological fracture: Secondary | ICD-10-CM | POA: Diagnosis not present

## 2016-06-29 DIAGNOSIS — S41102D Unspecified open wound of left upper arm, subsequent encounter: Secondary | ICD-10-CM | POA: Diagnosis not present

## 2016-06-29 DIAGNOSIS — R531 Weakness: Secondary | ICD-10-CM | POA: Diagnosis not present

## 2016-06-29 DIAGNOSIS — F0280 Dementia in other diseases classified elsewhere without behavioral disturbance: Secondary | ICD-10-CM | POA: Diagnosis not present

## 2016-06-29 DIAGNOSIS — I10 Essential (primary) hypertension: Secondary | ICD-10-CM | POA: Diagnosis not present

## 2016-06-29 DIAGNOSIS — G309 Alzheimer's disease, unspecified: Secondary | ICD-10-CM | POA: Diagnosis not present

## 2016-06-29 DIAGNOSIS — S81812D Laceration without foreign body, left lower leg, subsequent encounter: Secondary | ICD-10-CM | POA: Diagnosis not present

## 2016-06-30 DIAGNOSIS — S81812D Laceration without foreign body, left lower leg, subsequent encounter: Secondary | ICD-10-CM | POA: Diagnosis not present

## 2016-06-30 DIAGNOSIS — F0280 Dementia in other diseases classified elsewhere without behavioral disturbance: Secondary | ICD-10-CM | POA: Diagnosis not present

## 2016-06-30 DIAGNOSIS — I10 Essential (primary) hypertension: Secondary | ICD-10-CM | POA: Diagnosis not present

## 2016-06-30 DIAGNOSIS — M810 Age-related osteoporosis without current pathological fracture: Secondary | ICD-10-CM | POA: Diagnosis not present

## 2016-06-30 DIAGNOSIS — R531 Weakness: Secondary | ICD-10-CM | POA: Diagnosis not present

## 2016-06-30 DIAGNOSIS — S41102D Unspecified open wound of left upper arm, subsequent encounter: Secondary | ICD-10-CM | POA: Diagnosis not present

## 2016-06-30 DIAGNOSIS — G309 Alzheimer's disease, unspecified: Secondary | ICD-10-CM | POA: Diagnosis not present

## 2016-07-01 DIAGNOSIS — G309 Alzheimer's disease, unspecified: Secondary | ICD-10-CM | POA: Diagnosis not present

## 2016-07-01 DIAGNOSIS — F0280 Dementia in other diseases classified elsewhere without behavioral disturbance: Secondary | ICD-10-CM | POA: Diagnosis not present

## 2016-07-01 DIAGNOSIS — I10 Essential (primary) hypertension: Secondary | ICD-10-CM | POA: Diagnosis not present

## 2016-07-01 DIAGNOSIS — R531 Weakness: Secondary | ICD-10-CM | POA: Diagnosis not present

## 2016-07-01 DIAGNOSIS — M810 Age-related osteoporosis without current pathological fracture: Secondary | ICD-10-CM | POA: Diagnosis not present

## 2016-07-01 DIAGNOSIS — S41102D Unspecified open wound of left upper arm, subsequent encounter: Secondary | ICD-10-CM | POA: Diagnosis not present

## 2016-07-01 DIAGNOSIS — S81812D Laceration without foreign body, left lower leg, subsequent encounter: Secondary | ICD-10-CM | POA: Diagnosis not present

## 2016-07-05 DIAGNOSIS — F0280 Dementia in other diseases classified elsewhere without behavioral disturbance: Secondary | ICD-10-CM | POA: Diagnosis not present

## 2016-07-05 DIAGNOSIS — I10 Essential (primary) hypertension: Secondary | ICD-10-CM | POA: Diagnosis not present

## 2016-07-05 DIAGNOSIS — S41102D Unspecified open wound of left upper arm, subsequent encounter: Secondary | ICD-10-CM | POA: Diagnosis not present

## 2016-07-05 DIAGNOSIS — R531 Weakness: Secondary | ICD-10-CM | POA: Diagnosis not present

## 2016-07-05 DIAGNOSIS — S81812D Laceration without foreign body, left lower leg, subsequent encounter: Secondary | ICD-10-CM | POA: Diagnosis not present

## 2016-07-05 DIAGNOSIS — G309 Alzheimer's disease, unspecified: Secondary | ICD-10-CM | POA: Diagnosis not present

## 2016-07-05 DIAGNOSIS — M810 Age-related osteoporosis without current pathological fracture: Secondary | ICD-10-CM | POA: Diagnosis not present

## 2016-07-07 DIAGNOSIS — I10 Essential (primary) hypertension: Secondary | ICD-10-CM | POA: Diagnosis not present

## 2016-07-07 DIAGNOSIS — S81812D Laceration without foreign body, left lower leg, subsequent encounter: Secondary | ICD-10-CM | POA: Diagnosis not present

## 2016-07-07 DIAGNOSIS — R531 Weakness: Secondary | ICD-10-CM | POA: Diagnosis not present

## 2016-07-07 DIAGNOSIS — M810 Age-related osteoporosis without current pathological fracture: Secondary | ICD-10-CM | POA: Diagnosis not present

## 2016-07-07 DIAGNOSIS — S41102D Unspecified open wound of left upper arm, subsequent encounter: Secondary | ICD-10-CM | POA: Diagnosis not present

## 2016-07-07 DIAGNOSIS — F0280 Dementia in other diseases classified elsewhere without behavioral disturbance: Secondary | ICD-10-CM | POA: Diagnosis not present

## 2016-07-07 DIAGNOSIS — G309 Alzheimer's disease, unspecified: Secondary | ICD-10-CM | POA: Diagnosis not present

## 2016-07-11 DIAGNOSIS — F0280 Dementia in other diseases classified elsewhere without behavioral disturbance: Secondary | ICD-10-CM | POA: Diagnosis not present

## 2016-07-11 DIAGNOSIS — I10 Essential (primary) hypertension: Secondary | ICD-10-CM | POA: Diagnosis not present

## 2016-07-11 DIAGNOSIS — M810 Age-related osteoporosis without current pathological fracture: Secondary | ICD-10-CM | POA: Diagnosis not present

## 2016-07-11 DIAGNOSIS — R531 Weakness: Secondary | ICD-10-CM | POA: Diagnosis not present

## 2016-07-11 DIAGNOSIS — G309 Alzheimer's disease, unspecified: Secondary | ICD-10-CM | POA: Diagnosis not present

## 2016-07-11 DIAGNOSIS — S41102D Unspecified open wound of left upper arm, subsequent encounter: Secondary | ICD-10-CM | POA: Diagnosis not present

## 2016-07-11 DIAGNOSIS — S81812D Laceration without foreign body, left lower leg, subsequent encounter: Secondary | ICD-10-CM | POA: Diagnosis not present

## 2016-07-12 DIAGNOSIS — S41102D Unspecified open wound of left upper arm, subsequent encounter: Secondary | ICD-10-CM | POA: Diagnosis not present

## 2016-07-12 DIAGNOSIS — F0280 Dementia in other diseases classified elsewhere without behavioral disturbance: Secondary | ICD-10-CM | POA: Diagnosis not present

## 2016-07-12 DIAGNOSIS — I10 Essential (primary) hypertension: Secondary | ICD-10-CM | POA: Diagnosis not present

## 2016-07-12 DIAGNOSIS — S81812D Laceration without foreign body, left lower leg, subsequent encounter: Secondary | ICD-10-CM | POA: Diagnosis not present

## 2016-07-12 DIAGNOSIS — R531 Weakness: Secondary | ICD-10-CM | POA: Diagnosis not present

## 2016-07-12 DIAGNOSIS — G309 Alzheimer's disease, unspecified: Secondary | ICD-10-CM | POA: Diagnosis not present

## 2016-07-12 DIAGNOSIS — M810 Age-related osteoporosis without current pathological fracture: Secondary | ICD-10-CM | POA: Diagnosis not present

## 2016-07-14 DIAGNOSIS — G309 Alzheimer's disease, unspecified: Secondary | ICD-10-CM | POA: Diagnosis not present

## 2016-07-14 DIAGNOSIS — F0280 Dementia in other diseases classified elsewhere without behavioral disturbance: Secondary | ICD-10-CM | POA: Diagnosis not present

## 2016-07-14 DIAGNOSIS — M810 Age-related osteoporosis without current pathological fracture: Secondary | ICD-10-CM | POA: Diagnosis not present

## 2016-07-14 DIAGNOSIS — I10 Essential (primary) hypertension: Secondary | ICD-10-CM | POA: Diagnosis not present

## 2016-07-14 DIAGNOSIS — S81812D Laceration without foreign body, left lower leg, subsequent encounter: Secondary | ICD-10-CM | POA: Diagnosis not present

## 2016-07-14 DIAGNOSIS — R531 Weakness: Secondary | ICD-10-CM | POA: Diagnosis not present

## 2016-07-14 DIAGNOSIS — S41102D Unspecified open wound of left upper arm, subsequent encounter: Secondary | ICD-10-CM | POA: Diagnosis not present

## 2016-07-19 DIAGNOSIS — G309 Alzheimer's disease, unspecified: Secondary | ICD-10-CM | POA: Diagnosis not present

## 2016-07-19 DIAGNOSIS — F064 Anxiety disorder due to known physiological condition: Secondary | ICD-10-CM | POA: Diagnosis not present

## 2016-07-19 DIAGNOSIS — F0280 Dementia in other diseases classified elsewhere without behavioral disturbance: Secondary | ICD-10-CM | POA: Diagnosis not present

## 2016-07-19 DIAGNOSIS — S41102D Unspecified open wound of left upper arm, subsequent encounter: Secondary | ICD-10-CM | POA: Diagnosis not present

## 2016-07-19 DIAGNOSIS — M810 Age-related osteoporosis without current pathological fracture: Secondary | ICD-10-CM | POA: Diagnosis not present

## 2016-07-19 DIAGNOSIS — R531 Weakness: Secondary | ICD-10-CM | POA: Diagnosis not present

## 2016-07-19 DIAGNOSIS — F0150 Vascular dementia without behavioral disturbance: Secondary | ICD-10-CM | POA: Diagnosis not present

## 2016-07-19 DIAGNOSIS — I10 Essential (primary) hypertension: Secondary | ICD-10-CM | POA: Diagnosis not present

## 2016-07-19 DIAGNOSIS — S81812D Laceration without foreign body, left lower leg, subsequent encounter: Secondary | ICD-10-CM | POA: Diagnosis not present

## 2016-07-21 DIAGNOSIS — M810 Age-related osteoporosis without current pathological fracture: Secondary | ICD-10-CM | POA: Diagnosis not present

## 2016-07-21 DIAGNOSIS — G309 Alzheimer's disease, unspecified: Secondary | ICD-10-CM | POA: Diagnosis not present

## 2016-07-21 DIAGNOSIS — I10 Essential (primary) hypertension: Secondary | ICD-10-CM | POA: Diagnosis not present

## 2016-07-21 DIAGNOSIS — F0280 Dementia in other diseases classified elsewhere without behavioral disturbance: Secondary | ICD-10-CM | POA: Diagnosis not present

## 2016-07-21 DIAGNOSIS — R531 Weakness: Secondary | ICD-10-CM | POA: Diagnosis not present

## 2016-07-21 DIAGNOSIS — S41102D Unspecified open wound of left upper arm, subsequent encounter: Secondary | ICD-10-CM | POA: Diagnosis not present

## 2016-07-21 DIAGNOSIS — S81812D Laceration without foreign body, left lower leg, subsequent encounter: Secondary | ICD-10-CM | POA: Diagnosis not present

## 2016-07-22 DIAGNOSIS — B351 Tinea unguium: Secondary | ICD-10-CM | POA: Diagnosis not present

## 2016-07-22 DIAGNOSIS — Q845 Enlarged and hypertrophic nails: Secondary | ICD-10-CM | POA: Diagnosis not present

## 2016-07-22 DIAGNOSIS — F0150 Vascular dementia without behavioral disturbance: Secondary | ICD-10-CM | POA: Diagnosis not present

## 2016-07-22 DIAGNOSIS — M2010 Hallux valgus (acquired), unspecified foot: Secondary | ICD-10-CM | POA: Diagnosis not present

## 2016-07-22 DIAGNOSIS — L603 Nail dystrophy: Secondary | ICD-10-CM | POA: Diagnosis not present

## 2016-07-25 DIAGNOSIS — M810 Age-related osteoporosis without current pathological fracture: Secondary | ICD-10-CM | POA: Diagnosis not present

## 2016-07-25 DIAGNOSIS — F0280 Dementia in other diseases classified elsewhere without behavioral disturbance: Secondary | ICD-10-CM | POA: Diagnosis not present

## 2016-07-25 DIAGNOSIS — R531 Weakness: Secondary | ICD-10-CM | POA: Diagnosis not present

## 2016-07-25 DIAGNOSIS — I10 Essential (primary) hypertension: Secondary | ICD-10-CM | POA: Diagnosis not present

## 2016-07-25 DIAGNOSIS — S81812D Laceration without foreign body, left lower leg, subsequent encounter: Secondary | ICD-10-CM | POA: Diagnosis not present

## 2016-07-25 DIAGNOSIS — G309 Alzheimer's disease, unspecified: Secondary | ICD-10-CM | POA: Diagnosis not present

## 2016-07-25 DIAGNOSIS — S41102D Unspecified open wound of left upper arm, subsequent encounter: Secondary | ICD-10-CM | POA: Diagnosis not present

## 2016-07-26 DIAGNOSIS — F0280 Dementia in other diseases classified elsewhere without behavioral disturbance: Secondary | ICD-10-CM | POA: Diagnosis not present

## 2016-07-26 DIAGNOSIS — G309 Alzheimer's disease, unspecified: Secondary | ICD-10-CM | POA: Diagnosis not present

## 2016-07-26 DIAGNOSIS — S81812D Laceration without foreign body, left lower leg, subsequent encounter: Secondary | ICD-10-CM | POA: Diagnosis not present

## 2016-07-26 DIAGNOSIS — I10 Essential (primary) hypertension: Secondary | ICD-10-CM | POA: Diagnosis not present

## 2016-07-26 DIAGNOSIS — R531 Weakness: Secondary | ICD-10-CM | POA: Diagnosis not present

## 2016-07-26 DIAGNOSIS — S41102D Unspecified open wound of left upper arm, subsequent encounter: Secondary | ICD-10-CM | POA: Diagnosis not present

## 2016-07-26 DIAGNOSIS — M810 Age-related osteoporosis without current pathological fracture: Secondary | ICD-10-CM | POA: Diagnosis not present

## 2016-07-28 DIAGNOSIS — G309 Alzheimer's disease, unspecified: Secondary | ICD-10-CM | POA: Diagnosis not present

## 2016-07-28 DIAGNOSIS — F0280 Dementia in other diseases classified elsewhere without behavioral disturbance: Secondary | ICD-10-CM | POA: Diagnosis not present

## 2016-07-28 DIAGNOSIS — M810 Age-related osteoporosis without current pathological fracture: Secondary | ICD-10-CM | POA: Diagnosis not present

## 2016-07-28 DIAGNOSIS — S81812D Laceration without foreign body, left lower leg, subsequent encounter: Secondary | ICD-10-CM | POA: Diagnosis not present

## 2016-07-28 DIAGNOSIS — I10 Essential (primary) hypertension: Secondary | ICD-10-CM | POA: Diagnosis not present

## 2016-07-28 DIAGNOSIS — S41102D Unspecified open wound of left upper arm, subsequent encounter: Secondary | ICD-10-CM | POA: Diagnosis not present

## 2016-07-28 DIAGNOSIS — R531 Weakness: Secondary | ICD-10-CM | POA: Diagnosis not present

## 2016-08-02 DIAGNOSIS — G894 Chronic pain syndrome: Secondary | ICD-10-CM | POA: Diagnosis not present

## 2016-08-16 DIAGNOSIS — R600 Localized edema: Secondary | ICD-10-CM | POA: Diagnosis not present

## 2016-08-16 DIAGNOSIS — F0150 Vascular dementia without behavioral disturbance: Secondary | ICD-10-CM | POA: Diagnosis not present

## 2016-08-16 DIAGNOSIS — I639 Cerebral infarction, unspecified: Secondary | ICD-10-CM | POA: Diagnosis not present

## 2016-08-16 DIAGNOSIS — S81802A Unspecified open wound, left lower leg, initial encounter: Secondary | ICD-10-CM | POA: Diagnosis not present

## 2016-08-16 DIAGNOSIS — D649 Anemia, unspecified: Secondary | ICD-10-CM | POA: Diagnosis not present

## 2016-08-16 DIAGNOSIS — R112 Nausea with vomiting, unspecified: Secondary | ICD-10-CM | POA: Diagnosis not present

## 2016-08-16 DIAGNOSIS — M810 Age-related osteoporosis without current pathological fracture: Secondary | ICD-10-CM | POA: Diagnosis not present

## 2016-08-16 DIAGNOSIS — I10 Essential (primary) hypertension: Secondary | ICD-10-CM | POA: Diagnosis not present

## 2016-08-16 DIAGNOSIS — K219 Gastro-esophageal reflux disease without esophagitis: Secondary | ICD-10-CM | POA: Diagnosis not present

## 2016-08-16 DIAGNOSIS — F064 Anxiety disorder due to known physiological condition: Secondary | ICD-10-CM | POA: Diagnosis not present

## 2016-08-16 DIAGNOSIS — E559 Vitamin D deficiency, unspecified: Secondary | ICD-10-CM | POA: Diagnosis not present

## 2016-08-19 DIAGNOSIS — E559 Vitamin D deficiency, unspecified: Secondary | ICD-10-CM | POA: Diagnosis not present

## 2016-08-19 DIAGNOSIS — I10 Essential (primary) hypertension: Secondary | ICD-10-CM | POA: Diagnosis not present

## 2016-08-19 DIAGNOSIS — Z0001 Encounter for general adult medical examination with abnormal findings: Secondary | ICD-10-CM | POA: Diagnosis not present

## 2016-08-19 DIAGNOSIS — E039 Hypothyroidism, unspecified: Secondary | ICD-10-CM | POA: Diagnosis not present

## 2016-08-23 DIAGNOSIS — I639 Cerebral infarction, unspecified: Secondary | ICD-10-CM | POA: Diagnosis not present

## 2016-08-23 DIAGNOSIS — K219 Gastro-esophageal reflux disease without esophagitis: Secondary | ICD-10-CM | POA: Diagnosis not present

## 2016-08-23 DIAGNOSIS — R600 Localized edema: Secondary | ICD-10-CM | POA: Diagnosis not present

## 2016-08-23 DIAGNOSIS — R112 Nausea with vomiting, unspecified: Secondary | ICD-10-CM | POA: Diagnosis not present

## 2016-08-23 DIAGNOSIS — M810 Age-related osteoporosis without current pathological fracture: Secondary | ICD-10-CM | POA: Diagnosis not present

## 2016-08-23 DIAGNOSIS — I10 Essential (primary) hypertension: Secondary | ICD-10-CM | POA: Diagnosis not present

## 2016-08-23 DIAGNOSIS — S81802A Unspecified open wound, left lower leg, initial encounter: Secondary | ICD-10-CM | POA: Diagnosis not present

## 2016-08-23 DIAGNOSIS — D649 Anemia, unspecified: Secondary | ICD-10-CM | POA: Diagnosis not present

## 2016-08-23 DIAGNOSIS — E559 Vitamin D deficiency, unspecified: Secondary | ICD-10-CM | POA: Diagnosis not present

## 2016-08-30 DIAGNOSIS — G894 Chronic pain syndrome: Secondary | ICD-10-CM | POA: Diagnosis not present

## 2016-09-13 DIAGNOSIS — R600 Localized edema: Secondary | ICD-10-CM | POA: Diagnosis not present

## 2016-09-13 DIAGNOSIS — K219 Gastro-esophageal reflux disease without esophagitis: Secondary | ICD-10-CM | POA: Diagnosis not present

## 2016-09-13 DIAGNOSIS — J069 Acute upper respiratory infection, unspecified: Secondary | ICD-10-CM | POA: Diagnosis not present

## 2016-09-13 DIAGNOSIS — M810 Age-related osteoporosis without current pathological fracture: Secondary | ICD-10-CM | POA: Diagnosis not present

## 2016-09-13 DIAGNOSIS — I639 Cerebral infarction, unspecified: Secondary | ICD-10-CM | POA: Diagnosis not present

## 2016-09-13 DIAGNOSIS — E559 Vitamin D deficiency, unspecified: Secondary | ICD-10-CM | POA: Diagnosis not present

## 2016-09-13 DIAGNOSIS — I10 Essential (primary) hypertension: Secondary | ICD-10-CM | POA: Diagnosis not present

## 2016-09-13 DIAGNOSIS — D649 Anemia, unspecified: Secondary | ICD-10-CM | POA: Diagnosis not present

## 2016-09-13 DIAGNOSIS — R112 Nausea with vomiting, unspecified: Secondary | ICD-10-CM | POA: Diagnosis not present

## 2016-09-14 DIAGNOSIS — Z79899 Other long term (current) drug therapy: Secondary | ICD-10-CM | POA: Diagnosis not present

## 2016-09-20 DIAGNOSIS — F064 Anxiety disorder due to known physiological condition: Secondary | ICD-10-CM | POA: Diagnosis not present

## 2016-09-20 DIAGNOSIS — G894 Chronic pain syndrome: Secondary | ICD-10-CM | POA: Diagnosis not present

## 2016-09-20 DIAGNOSIS — F0150 Vascular dementia without behavioral disturbance: Secondary | ICD-10-CM | POA: Diagnosis not present

## 2016-09-20 DIAGNOSIS — S81802A Unspecified open wound, left lower leg, initial encounter: Secondary | ICD-10-CM | POA: Diagnosis not present

## 2016-09-20 DIAGNOSIS — R197 Diarrhea, unspecified: Secondary | ICD-10-CM | POA: Diagnosis not present

## 2016-09-22 DIAGNOSIS — L89312 Pressure ulcer of right buttock, stage 2: Secondary | ICD-10-CM | POA: Diagnosis not present

## 2016-09-22 DIAGNOSIS — G309 Alzheimer's disease, unspecified: Secondary | ICD-10-CM | POA: Diagnosis not present

## 2016-09-22 DIAGNOSIS — S81802D Unspecified open wound, left lower leg, subsequent encounter: Secondary | ICD-10-CM | POA: Diagnosis not present

## 2016-09-22 DIAGNOSIS — X58XXXD Exposure to other specified factors, subsequent encounter: Secondary | ICD-10-CM | POA: Diagnosis not present

## 2016-09-22 DIAGNOSIS — F0280 Dementia in other diseases classified elsewhere without behavioral disturbance: Secondary | ICD-10-CM | POA: Diagnosis not present

## 2016-09-22 DIAGNOSIS — M810 Age-related osteoporosis without current pathological fracture: Secondary | ICD-10-CM | POA: Diagnosis not present

## 2016-09-26 DIAGNOSIS — X58XXXD Exposure to other specified factors, subsequent encounter: Secondary | ICD-10-CM | POA: Diagnosis not present

## 2016-09-26 DIAGNOSIS — L89312 Pressure ulcer of right buttock, stage 2: Secondary | ICD-10-CM | POA: Diagnosis not present

## 2016-09-26 DIAGNOSIS — M810 Age-related osteoporosis without current pathological fracture: Secondary | ICD-10-CM | POA: Diagnosis not present

## 2016-09-26 DIAGNOSIS — G309 Alzheimer's disease, unspecified: Secondary | ICD-10-CM | POA: Diagnosis not present

## 2016-09-26 DIAGNOSIS — S81802D Unspecified open wound, left lower leg, subsequent encounter: Secondary | ICD-10-CM | POA: Diagnosis not present

## 2016-09-26 DIAGNOSIS — F0280 Dementia in other diseases classified elsewhere without behavioral disturbance: Secondary | ICD-10-CM | POA: Diagnosis not present

## 2016-09-27 DIAGNOSIS — R627 Adult failure to thrive: Secondary | ICD-10-CM | POA: Diagnosis not present

## 2016-09-28 DIAGNOSIS — L89312 Pressure ulcer of right buttock, stage 2: Secondary | ICD-10-CM | POA: Diagnosis not present

## 2016-09-28 DIAGNOSIS — G309 Alzheimer's disease, unspecified: Secondary | ICD-10-CM | POA: Diagnosis not present

## 2016-09-28 DIAGNOSIS — M810 Age-related osteoporosis without current pathological fracture: Secondary | ICD-10-CM | POA: Diagnosis not present

## 2016-09-28 DIAGNOSIS — F0280 Dementia in other diseases classified elsewhere without behavioral disturbance: Secondary | ICD-10-CM | POA: Diagnosis not present

## 2016-09-28 DIAGNOSIS — S81802D Unspecified open wound, left lower leg, subsequent encounter: Secondary | ICD-10-CM | POA: Diagnosis not present

## 2016-09-28 DIAGNOSIS — X58XXXD Exposure to other specified factors, subsequent encounter: Secondary | ICD-10-CM | POA: Diagnosis not present

## 2016-10-11 DIAGNOSIS — Z0000 Encounter for general adult medical examination without abnormal findings: Secondary | ICD-10-CM | POA: Diagnosis not present

## 2016-10-25 DIAGNOSIS — H35033 Hypertensive retinopathy, bilateral: Secondary | ICD-10-CM | POA: Diagnosis not present

## 2016-10-25 DIAGNOSIS — H353131 Nonexudative age-related macular degeneration, bilateral, early dry stage: Secondary | ICD-10-CM | POA: Diagnosis not present

## 2016-10-25 DIAGNOSIS — H18413 Arcus senilis, bilateral: Secondary | ICD-10-CM | POA: Diagnosis not present

## 2016-10-25 DIAGNOSIS — Z961 Presence of intraocular lens: Secondary | ICD-10-CM | POA: Diagnosis not present

## 2016-10-25 DIAGNOSIS — H43813 Vitreous degeneration, bilateral: Secondary | ICD-10-CM | POA: Diagnosis not present

## 2016-11-08 DIAGNOSIS — R112 Nausea with vomiting, unspecified: Secondary | ICD-10-CM | POA: Diagnosis not present

## 2016-11-08 DIAGNOSIS — E559 Vitamin D deficiency, unspecified: Secondary | ICD-10-CM | POA: Diagnosis not present

## 2016-11-08 DIAGNOSIS — M810 Age-related osteoporosis without current pathological fracture: Secondary | ICD-10-CM | POA: Diagnosis not present

## 2016-11-08 DIAGNOSIS — I10 Essential (primary) hypertension: Secondary | ICD-10-CM | POA: Diagnosis not present

## 2016-11-08 DIAGNOSIS — R600 Localized edema: Secondary | ICD-10-CM | POA: Diagnosis not present

## 2016-11-08 DIAGNOSIS — K219 Gastro-esophageal reflux disease without esophagitis: Secondary | ICD-10-CM | POA: Diagnosis not present

## 2016-11-08 DIAGNOSIS — I639 Cerebral infarction, unspecified: Secondary | ICD-10-CM | POA: Diagnosis not present

## 2016-11-08 DIAGNOSIS — K5901 Slow transit constipation: Secondary | ICD-10-CM | POA: Diagnosis not present

## 2016-11-08 DIAGNOSIS — D649 Anemia, unspecified: Secondary | ICD-10-CM | POA: Diagnosis not present

## 2016-11-10 DIAGNOSIS — F0150 Vascular dementia without behavioral disturbance: Secondary | ICD-10-CM | POA: Diagnosis not present

## 2016-11-10 DIAGNOSIS — F064 Anxiety disorder due to known physiological condition: Secondary | ICD-10-CM | POA: Diagnosis not present

## 2016-11-10 DIAGNOSIS — F17201 Nicotine dependence, unspecified, in remission: Secondary | ICD-10-CM | POA: Diagnosis not present

## 2016-11-18 DIAGNOSIS — F064 Anxiety disorder due to known physiological condition: Secondary | ICD-10-CM | POA: Diagnosis not present

## 2016-11-18 DIAGNOSIS — I639 Cerebral infarction, unspecified: Secondary | ICD-10-CM | POA: Diagnosis not present

## 2016-11-18 DIAGNOSIS — S81802A Unspecified open wound, left lower leg, initial encounter: Secondary | ICD-10-CM | POA: Diagnosis not present

## 2016-11-29 DIAGNOSIS — T7849XS Other allergy, sequela: Secondary | ICD-10-CM | POA: Diagnosis not present

## 2016-11-29 DIAGNOSIS — M810 Age-related osteoporosis without current pathological fracture: Secondary | ICD-10-CM | POA: Diagnosis not present

## 2016-11-29 DIAGNOSIS — K219 Gastro-esophageal reflux disease without esophagitis: Secondary | ICD-10-CM | POA: Diagnosis not present

## 2016-11-29 DIAGNOSIS — R112 Nausea with vomiting, unspecified: Secondary | ICD-10-CM | POA: Diagnosis not present

## 2016-11-29 DIAGNOSIS — E559 Vitamin D deficiency, unspecified: Secondary | ICD-10-CM | POA: Diagnosis not present

## 2016-11-29 DIAGNOSIS — R600 Localized edema: Secondary | ICD-10-CM | POA: Diagnosis not present

## 2016-11-29 DIAGNOSIS — D649 Anemia, unspecified: Secondary | ICD-10-CM | POA: Diagnosis not present

## 2016-11-29 DIAGNOSIS — I10 Essential (primary) hypertension: Secondary | ICD-10-CM | POA: Diagnosis not present

## 2016-11-29 DIAGNOSIS — I639 Cerebral infarction, unspecified: Secondary | ICD-10-CM | POA: Diagnosis not present

## 2016-12-06 DIAGNOSIS — F17201 Nicotine dependence, unspecified, in remission: Secondary | ICD-10-CM | POA: Diagnosis not present

## 2016-12-06 DIAGNOSIS — F064 Anxiety disorder due to known physiological condition: Secondary | ICD-10-CM | POA: Diagnosis not present

## 2016-12-06 DIAGNOSIS — F0150 Vascular dementia without behavioral disturbance: Secondary | ICD-10-CM | POA: Diagnosis not present

## 2016-12-08 DIAGNOSIS — R600 Localized edema: Secondary | ICD-10-CM | POA: Diagnosis not present

## 2016-12-08 DIAGNOSIS — M2010 Hallux valgus (acquired), unspecified foot: Secondary | ICD-10-CM | POA: Diagnosis not present

## 2016-12-08 DIAGNOSIS — B351 Tinea unguium: Secondary | ICD-10-CM | POA: Diagnosis not present

## 2016-12-08 DIAGNOSIS — Q845 Enlarged and hypertrophic nails: Secondary | ICD-10-CM | POA: Diagnosis not present

## 2016-12-08 DIAGNOSIS — L603 Nail dystrophy: Secondary | ICD-10-CM | POA: Diagnosis not present

## 2016-12-15 DIAGNOSIS — E559 Vitamin D deficiency, unspecified: Secondary | ICD-10-CM | POA: Diagnosis not present

## 2016-12-15 DIAGNOSIS — T7849XS Other allergy, sequela: Secondary | ICD-10-CM | POA: Diagnosis not present

## 2016-12-15 DIAGNOSIS — M810 Age-related osteoporosis without current pathological fracture: Secondary | ICD-10-CM | POA: Diagnosis not present

## 2016-12-15 DIAGNOSIS — K219 Gastro-esophageal reflux disease without esophagitis: Secondary | ICD-10-CM | POA: Diagnosis not present

## 2016-12-15 DIAGNOSIS — I10 Essential (primary) hypertension: Secondary | ICD-10-CM | POA: Diagnosis not present

## 2016-12-15 DIAGNOSIS — R112 Nausea with vomiting, unspecified: Secondary | ICD-10-CM | POA: Diagnosis not present

## 2016-12-15 DIAGNOSIS — R600 Localized edema: Secondary | ICD-10-CM | POA: Diagnosis not present

## 2016-12-15 DIAGNOSIS — D649 Anemia, unspecified: Secondary | ICD-10-CM | POA: Diagnosis not present

## 2016-12-15 DIAGNOSIS — I639 Cerebral infarction, unspecified: Secondary | ICD-10-CM | POA: Diagnosis not present

## 2016-12-20 DIAGNOSIS — F064 Anxiety disorder due to known physiological condition: Secondary | ICD-10-CM | POA: Diagnosis not present

## 2016-12-20 DIAGNOSIS — F0150 Vascular dementia without behavioral disturbance: Secondary | ICD-10-CM | POA: Diagnosis not present

## 2016-12-20 DIAGNOSIS — F17201 Nicotine dependence, unspecified, in remission: Secondary | ICD-10-CM | POA: Diagnosis not present

## 2016-12-27 DIAGNOSIS — T7849XS Other allergy, sequela: Secondary | ICD-10-CM | POA: Diagnosis not present

## 2016-12-27 DIAGNOSIS — M810 Age-related osteoporosis without current pathological fracture: Secondary | ICD-10-CM | POA: Diagnosis not present

## 2016-12-27 DIAGNOSIS — E559 Vitamin D deficiency, unspecified: Secondary | ICD-10-CM | POA: Diagnosis not present

## 2016-12-27 DIAGNOSIS — D649 Anemia, unspecified: Secondary | ICD-10-CM | POA: Diagnosis not present

## 2016-12-27 DIAGNOSIS — I639 Cerebral infarction, unspecified: Secondary | ICD-10-CM | POA: Diagnosis not present

## 2016-12-27 DIAGNOSIS — R112 Nausea with vomiting, unspecified: Secondary | ICD-10-CM | POA: Diagnosis not present

## 2016-12-27 DIAGNOSIS — K219 Gastro-esophageal reflux disease without esophagitis: Secondary | ICD-10-CM | POA: Diagnosis not present

## 2016-12-27 DIAGNOSIS — I10 Essential (primary) hypertension: Secondary | ICD-10-CM | POA: Diagnosis not present

## 2016-12-27 DIAGNOSIS — R600 Localized edema: Secondary | ICD-10-CM | POA: Diagnosis not present

## 2017-01-24 DIAGNOSIS — K219 Gastro-esophageal reflux disease without esophagitis: Secondary | ICD-10-CM | POA: Diagnosis not present

## 2017-01-24 DIAGNOSIS — D649 Anemia, unspecified: Secondary | ICD-10-CM | POA: Diagnosis not present

## 2017-01-24 DIAGNOSIS — K5901 Slow transit constipation: Secondary | ICD-10-CM | POA: Diagnosis not present

## 2017-01-24 DIAGNOSIS — T7849XS Other allergy, sequela: Secondary | ICD-10-CM | POA: Diagnosis not present

## 2017-01-24 DIAGNOSIS — I10 Essential (primary) hypertension: Secondary | ICD-10-CM | POA: Diagnosis not present

## 2017-01-24 DIAGNOSIS — I639 Cerebral infarction, unspecified: Secondary | ICD-10-CM | POA: Diagnosis not present

## 2017-01-24 DIAGNOSIS — E559 Vitamin D deficiency, unspecified: Secondary | ICD-10-CM | POA: Diagnosis not present

## 2017-01-24 DIAGNOSIS — M810 Age-related osteoporosis without current pathological fracture: Secondary | ICD-10-CM | POA: Diagnosis not present

## 2017-01-24 DIAGNOSIS — R600 Localized edema: Secondary | ICD-10-CM | POA: Diagnosis not present

## 2017-02-21 DIAGNOSIS — K219 Gastro-esophageal reflux disease without esophagitis: Secondary | ICD-10-CM | POA: Diagnosis not present

## 2017-02-21 DIAGNOSIS — I10 Essential (primary) hypertension: Secondary | ICD-10-CM | POA: Diagnosis not present

## 2017-02-21 DIAGNOSIS — K5901 Slow transit constipation: Secondary | ICD-10-CM | POA: Diagnosis not present

## 2017-02-21 DIAGNOSIS — I639 Cerebral infarction, unspecified: Secondary | ICD-10-CM | POA: Diagnosis not present

## 2017-02-21 DIAGNOSIS — T7849XS Other allergy, sequela: Secondary | ICD-10-CM | POA: Diagnosis not present

## 2017-02-21 DIAGNOSIS — M810 Age-related osteoporosis without current pathological fracture: Secondary | ICD-10-CM | POA: Diagnosis not present

## 2017-02-21 DIAGNOSIS — E559 Vitamin D deficiency, unspecified: Secondary | ICD-10-CM | POA: Diagnosis not present

## 2017-02-21 DIAGNOSIS — D649 Anemia, unspecified: Secondary | ICD-10-CM | POA: Diagnosis not present

## 2017-02-21 DIAGNOSIS — R600 Localized edema: Secondary | ICD-10-CM | POA: Diagnosis not present

## 2017-02-22 DIAGNOSIS — B351 Tinea unguium: Secondary | ICD-10-CM | POA: Diagnosis not present

## 2017-02-22 DIAGNOSIS — M2010 Hallux valgus (acquired), unspecified foot: Secondary | ICD-10-CM | POA: Diagnosis not present

## 2017-02-22 DIAGNOSIS — Q845 Enlarged and hypertrophic nails: Secondary | ICD-10-CM | POA: Diagnosis not present

## 2017-02-22 DIAGNOSIS — L603 Nail dystrophy: Secondary | ICD-10-CM | POA: Diagnosis not present

## 2017-02-22 DIAGNOSIS — R600 Localized edema: Secondary | ICD-10-CM | POA: Diagnosis not present

## 2017-03-21 DIAGNOSIS — K219 Gastro-esophageal reflux disease without esophagitis: Secondary | ICD-10-CM | POA: Diagnosis not present

## 2017-03-21 DIAGNOSIS — E559 Vitamin D deficiency, unspecified: Secondary | ICD-10-CM | POA: Diagnosis not present

## 2017-03-21 DIAGNOSIS — K5901 Slow transit constipation: Secondary | ICD-10-CM | POA: Diagnosis not present

## 2017-03-21 DIAGNOSIS — M810 Age-related osteoporosis without current pathological fracture: Secondary | ICD-10-CM | POA: Diagnosis not present

## 2017-03-21 DIAGNOSIS — R600 Localized edema: Secondary | ICD-10-CM | POA: Diagnosis not present

## 2017-03-21 DIAGNOSIS — I10 Essential (primary) hypertension: Secondary | ICD-10-CM | POA: Diagnosis not present

## 2017-03-21 DIAGNOSIS — T7849XS Other allergy, sequela: Secondary | ICD-10-CM | POA: Diagnosis not present

## 2017-03-21 DIAGNOSIS — D649 Anemia, unspecified: Secondary | ICD-10-CM | POA: Diagnosis not present

## 2017-03-21 DIAGNOSIS — I639 Cerebral infarction, unspecified: Secondary | ICD-10-CM | POA: Diagnosis not present

## 2017-04-18 DIAGNOSIS — K5901 Slow transit constipation: Secondary | ICD-10-CM | POA: Diagnosis not present

## 2017-04-18 DIAGNOSIS — E559 Vitamin D deficiency, unspecified: Secondary | ICD-10-CM | POA: Diagnosis not present

## 2017-04-18 DIAGNOSIS — M810 Age-related osteoporosis without current pathological fracture: Secondary | ICD-10-CM | POA: Diagnosis not present

## 2017-04-18 DIAGNOSIS — I10 Essential (primary) hypertension: Secondary | ICD-10-CM | POA: Diagnosis not present

## 2017-04-18 DIAGNOSIS — D649 Anemia, unspecified: Secondary | ICD-10-CM | POA: Diagnosis not present

## 2017-04-18 DIAGNOSIS — T7849XS Other allergy, sequela: Secondary | ICD-10-CM | POA: Diagnosis not present

## 2017-04-18 DIAGNOSIS — I639 Cerebral infarction, unspecified: Secondary | ICD-10-CM | POA: Diagnosis not present

## 2017-04-18 DIAGNOSIS — R600 Localized edema: Secondary | ICD-10-CM | POA: Diagnosis not present

## 2017-04-18 DIAGNOSIS — K219 Gastro-esophageal reflux disease without esophagitis: Secondary | ICD-10-CM | POA: Diagnosis not present

## 2017-05-15 DIAGNOSIS — R600 Localized edema: Secondary | ICD-10-CM | POA: Diagnosis not present

## 2017-05-15 DIAGNOSIS — I639 Cerebral infarction, unspecified: Secondary | ICD-10-CM | POA: Diagnosis not present

## 2017-05-15 DIAGNOSIS — M810 Age-related osteoporosis without current pathological fracture: Secondary | ICD-10-CM | POA: Diagnosis not present

## 2017-05-15 DIAGNOSIS — E559 Vitamin D deficiency, unspecified: Secondary | ICD-10-CM | POA: Diagnosis not present

## 2017-05-15 DIAGNOSIS — K5901 Slow transit constipation: Secondary | ICD-10-CM | POA: Diagnosis not present

## 2017-05-15 DIAGNOSIS — K219 Gastro-esophageal reflux disease without esophagitis: Secondary | ICD-10-CM | POA: Diagnosis not present

## 2017-05-15 DIAGNOSIS — I10 Essential (primary) hypertension: Secondary | ICD-10-CM | POA: Diagnosis not present

## 2017-05-15 DIAGNOSIS — D649 Anemia, unspecified: Secondary | ICD-10-CM | POA: Diagnosis not present

## 2017-05-15 DIAGNOSIS — T7849XS Other allergy, sequela: Secondary | ICD-10-CM | POA: Diagnosis not present

## 2017-05-16 DIAGNOSIS — K219 Gastro-esophageal reflux disease without esophagitis: Secondary | ICD-10-CM | POA: Diagnosis not present

## 2017-05-16 DIAGNOSIS — I639 Cerebral infarction, unspecified: Secondary | ICD-10-CM | POA: Diagnosis not present

## 2017-05-16 DIAGNOSIS — T7849XS Other allergy, sequela: Secondary | ICD-10-CM | POA: Diagnosis not present

## 2017-05-16 DIAGNOSIS — M810 Age-related osteoporosis without current pathological fracture: Secondary | ICD-10-CM | POA: Diagnosis not present

## 2017-05-16 DIAGNOSIS — R600 Localized edema: Secondary | ICD-10-CM | POA: Diagnosis not present

## 2017-05-16 DIAGNOSIS — I10 Essential (primary) hypertension: Secondary | ICD-10-CM | POA: Diagnosis not present

## 2017-05-16 DIAGNOSIS — D649 Anemia, unspecified: Secondary | ICD-10-CM | POA: Diagnosis not present

## 2017-05-16 DIAGNOSIS — K5901 Slow transit constipation: Secondary | ICD-10-CM | POA: Diagnosis not present

## 2017-05-16 DIAGNOSIS — E559 Vitamin D deficiency, unspecified: Secondary | ICD-10-CM | POA: Diagnosis not present

## 2017-07-31 DIAGNOSIS — F17201 Nicotine dependence, unspecified, in remission: Secondary | ICD-10-CM | POA: Diagnosis not present

## 2017-07-31 DIAGNOSIS — F064 Anxiety disorder due to known physiological condition: Secondary | ICD-10-CM | POA: Diagnosis not present

## 2017-07-31 DIAGNOSIS — F0150 Vascular dementia without behavioral disturbance: Secondary | ICD-10-CM | POA: Diagnosis not present

## 2017-08-08 DIAGNOSIS — M810 Age-related osteoporosis without current pathological fracture: Secondary | ICD-10-CM | POA: Diagnosis not present

## 2017-08-08 DIAGNOSIS — H5789 Other specified disorders of eye and adnexa: Secondary | ICD-10-CM | POA: Diagnosis not present

## 2017-08-08 DIAGNOSIS — K219 Gastro-esophageal reflux disease without esophagitis: Secondary | ICD-10-CM | POA: Diagnosis not present

## 2017-08-08 DIAGNOSIS — D649 Anemia, unspecified: Secondary | ICD-10-CM | POA: Diagnosis not present

## 2017-08-08 DIAGNOSIS — I639 Cerebral infarction, unspecified: Secondary | ICD-10-CM | POA: Diagnosis not present

## 2017-08-08 DIAGNOSIS — T7849XS Other allergy, sequela: Secondary | ICD-10-CM | POA: Diagnosis not present

## 2017-08-08 DIAGNOSIS — K5901 Slow transit constipation: Secondary | ICD-10-CM | POA: Diagnosis not present

## 2017-08-08 DIAGNOSIS — E559 Vitamin D deficiency, unspecified: Secondary | ICD-10-CM | POA: Diagnosis not present

## 2017-08-08 DIAGNOSIS — I10 Essential (primary) hypertension: Secondary | ICD-10-CM | POA: Diagnosis not present

## 2017-08-28 DIAGNOSIS — F0150 Vascular dementia without behavioral disturbance: Secondary | ICD-10-CM | POA: Diagnosis not present

## 2017-08-28 DIAGNOSIS — F17201 Nicotine dependence, unspecified, in remission: Secondary | ICD-10-CM | POA: Diagnosis not present

## 2017-08-28 DIAGNOSIS — F064 Anxiety disorder due to known physiological condition: Secondary | ICD-10-CM | POA: Diagnosis not present

## 2017-08-30 DIAGNOSIS — L603 Nail dystrophy: Secondary | ICD-10-CM | POA: Diagnosis not present

## 2017-08-30 DIAGNOSIS — Q845 Enlarged and hypertrophic nails: Secondary | ICD-10-CM | POA: Diagnosis not present

## 2017-08-30 DIAGNOSIS — R600 Localized edema: Secondary | ICD-10-CM | POA: Diagnosis not present

## 2017-08-30 DIAGNOSIS — B351 Tinea unguium: Secondary | ICD-10-CM | POA: Diagnosis not present

## 2017-08-30 DIAGNOSIS — I739 Peripheral vascular disease, unspecified: Secondary | ICD-10-CM | POA: Diagnosis not present

## 2017-08-30 DIAGNOSIS — M2010 Hallux valgus (acquired), unspecified foot: Secondary | ICD-10-CM | POA: Diagnosis not present

## 2017-09-05 DIAGNOSIS — T7849XS Other allergy, sequela: Secondary | ICD-10-CM | POA: Diagnosis not present

## 2017-09-05 DIAGNOSIS — H5789 Other specified disorders of eye and adnexa: Secondary | ICD-10-CM | POA: Diagnosis not present

## 2017-09-05 DIAGNOSIS — D649 Anemia, unspecified: Secondary | ICD-10-CM | POA: Diagnosis not present

## 2017-09-05 DIAGNOSIS — M810 Age-related osteoporosis without current pathological fracture: Secondary | ICD-10-CM | POA: Diagnosis not present

## 2017-09-05 DIAGNOSIS — I639 Cerebral infarction, unspecified: Secondary | ICD-10-CM | POA: Diagnosis not present

## 2017-09-05 DIAGNOSIS — K5901 Slow transit constipation: Secondary | ICD-10-CM | POA: Diagnosis not present

## 2017-09-05 DIAGNOSIS — I10 Essential (primary) hypertension: Secondary | ICD-10-CM | POA: Diagnosis not present

## 2017-09-05 DIAGNOSIS — E559 Vitamin D deficiency, unspecified: Secondary | ICD-10-CM | POA: Diagnosis not present

## 2017-09-05 DIAGNOSIS — K219 Gastro-esophageal reflux disease without esophagitis: Secondary | ICD-10-CM | POA: Diagnosis not present

## 2017-09-25 DIAGNOSIS — F064 Anxiety disorder due to known physiological condition: Secondary | ICD-10-CM | POA: Diagnosis not present

## 2017-09-25 DIAGNOSIS — F17201 Nicotine dependence, unspecified, in remission: Secondary | ICD-10-CM | POA: Diagnosis not present

## 2017-09-25 DIAGNOSIS — F0150 Vascular dementia without behavioral disturbance: Secondary | ICD-10-CM | POA: Diagnosis not present

## 2017-09-26 DIAGNOSIS — I10 Essential (primary) hypertension: Secondary | ICD-10-CM | POA: Diagnosis not present

## 2017-09-26 DIAGNOSIS — K219 Gastro-esophageal reflux disease without esophagitis: Secondary | ICD-10-CM | POA: Diagnosis not present

## 2017-09-26 DIAGNOSIS — T7849XS Other allergy, sequela: Secondary | ICD-10-CM | POA: Diagnosis not present

## 2017-09-26 DIAGNOSIS — I639 Cerebral infarction, unspecified: Secondary | ICD-10-CM | POA: Diagnosis not present

## 2017-09-26 DIAGNOSIS — D649 Anemia, unspecified: Secondary | ICD-10-CM | POA: Diagnosis not present

## 2017-09-26 DIAGNOSIS — M810 Age-related osteoporosis without current pathological fracture: Secondary | ICD-10-CM | POA: Diagnosis not present

## 2017-09-26 DIAGNOSIS — K5901 Slow transit constipation: Secondary | ICD-10-CM | POA: Diagnosis not present

## 2017-09-26 DIAGNOSIS — G309 Alzheimer's disease, unspecified: Secondary | ICD-10-CM | POA: Diagnosis not present

## 2017-09-26 DIAGNOSIS — E559 Vitamin D deficiency, unspecified: Secondary | ICD-10-CM | POA: Diagnosis not present

## 2017-10-23 DIAGNOSIS — F0150 Vascular dementia without behavioral disturbance: Secondary | ICD-10-CM | POA: Diagnosis not present

## 2017-10-23 DIAGNOSIS — F064 Anxiety disorder due to known physiological condition: Secondary | ICD-10-CM | POA: Diagnosis not present

## 2017-10-23 DIAGNOSIS — F17201 Nicotine dependence, unspecified, in remission: Secondary | ICD-10-CM | POA: Diagnosis not present

## 2017-11-07 DIAGNOSIS — H43813 Vitreous degeneration, bilateral: Secondary | ICD-10-CM | POA: Diagnosis not present

## 2017-11-07 DIAGNOSIS — H35033 Hypertensive retinopathy, bilateral: Secondary | ICD-10-CM | POA: Diagnosis not present

## 2017-11-07 DIAGNOSIS — H18413 Arcus senilis, bilateral: Secondary | ICD-10-CM | POA: Diagnosis not present

## 2017-11-07 DIAGNOSIS — Z961 Presence of intraocular lens: Secondary | ICD-10-CM | POA: Diagnosis not present

## 2017-11-07 DIAGNOSIS — H353131 Nonexudative age-related macular degeneration, bilateral, early dry stage: Secondary | ICD-10-CM | POA: Diagnosis not present

## 2017-11-28 DIAGNOSIS — E559 Vitamin D deficiency, unspecified: Secondary | ICD-10-CM | POA: Diagnosis not present

## 2017-11-28 DIAGNOSIS — J111 Influenza due to unidentified influenza virus with other respiratory manifestations: Secondary | ICD-10-CM | POA: Diagnosis not present

## 2017-11-28 DIAGNOSIS — T7849XS Other allergy, sequela: Secondary | ICD-10-CM | POA: Diagnosis not present

## 2017-11-28 DIAGNOSIS — I639 Cerebral infarction, unspecified: Secondary | ICD-10-CM | POA: Diagnosis not present

## 2017-11-28 DIAGNOSIS — D649 Anemia, unspecified: Secondary | ICD-10-CM | POA: Diagnosis not present

## 2017-11-28 DIAGNOSIS — K219 Gastro-esophageal reflux disease without esophagitis: Secondary | ICD-10-CM | POA: Diagnosis not present

## 2017-11-28 DIAGNOSIS — M810 Age-related osteoporosis without current pathological fracture: Secondary | ICD-10-CM | POA: Diagnosis not present

## 2017-11-28 DIAGNOSIS — I10 Essential (primary) hypertension: Secondary | ICD-10-CM | POA: Diagnosis not present

## 2017-11-28 DIAGNOSIS — K5901 Slow transit constipation: Secondary | ICD-10-CM | POA: Diagnosis not present

## 2017-12-04 DIAGNOSIS — B351 Tinea unguium: Secondary | ICD-10-CM | POA: Diagnosis not present

## 2017-12-04 DIAGNOSIS — F0150 Vascular dementia without behavioral disturbance: Secondary | ICD-10-CM | POA: Diagnosis not present

## 2017-12-04 DIAGNOSIS — L603 Nail dystrophy: Secondary | ICD-10-CM | POA: Diagnosis not present

## 2017-12-04 DIAGNOSIS — I739 Peripheral vascular disease, unspecified: Secondary | ICD-10-CM | POA: Diagnosis not present

## 2017-12-04 DIAGNOSIS — Q845 Enlarged and hypertrophic nails: Secondary | ICD-10-CM | POA: Diagnosis not present

## 2017-12-04 DIAGNOSIS — F064 Anxiety disorder due to known physiological condition: Secondary | ICD-10-CM | POA: Diagnosis not present

## 2017-12-04 DIAGNOSIS — F17201 Nicotine dependence, unspecified, in remission: Secondary | ICD-10-CM | POA: Diagnosis not present

## 2017-12-05 DIAGNOSIS — K219 Gastro-esophageal reflux disease without esophagitis: Secondary | ICD-10-CM | POA: Diagnosis not present

## 2017-12-05 DIAGNOSIS — K5901 Slow transit constipation: Secondary | ICD-10-CM | POA: Diagnosis not present

## 2017-12-05 DIAGNOSIS — T7849XS Other allergy, sequela: Secondary | ICD-10-CM | POA: Diagnosis not present

## 2017-12-05 DIAGNOSIS — M810 Age-related osteoporosis without current pathological fracture: Secondary | ICD-10-CM | POA: Diagnosis not present

## 2017-12-05 DIAGNOSIS — E559 Vitamin D deficiency, unspecified: Secondary | ICD-10-CM | POA: Diagnosis not present

## 2017-12-05 DIAGNOSIS — J111 Influenza due to unidentified influenza virus with other respiratory manifestations: Secondary | ICD-10-CM | POA: Diagnosis not present

## 2017-12-05 DIAGNOSIS — I639 Cerebral infarction, unspecified: Secondary | ICD-10-CM | POA: Diagnosis not present

## 2017-12-05 DIAGNOSIS — I10 Essential (primary) hypertension: Secondary | ICD-10-CM | POA: Diagnosis not present

## 2017-12-05 DIAGNOSIS — D649 Anemia, unspecified: Secondary | ICD-10-CM | POA: Diagnosis not present

## 2017-12-26 DIAGNOSIS — E559 Vitamin D deficiency, unspecified: Secondary | ICD-10-CM | POA: Diagnosis not present

## 2017-12-26 DIAGNOSIS — I10 Essential (primary) hypertension: Secondary | ICD-10-CM | POA: Diagnosis not present

## 2017-12-26 DIAGNOSIS — I639 Cerebral infarction, unspecified: Secondary | ICD-10-CM | POA: Diagnosis not present

## 2017-12-26 DIAGNOSIS — K5901 Slow transit constipation: Secondary | ICD-10-CM | POA: Diagnosis not present

## 2017-12-26 DIAGNOSIS — T7849XS Other allergy, sequela: Secondary | ICD-10-CM | POA: Diagnosis not present

## 2017-12-26 DIAGNOSIS — D649 Anemia, unspecified: Secondary | ICD-10-CM | POA: Diagnosis not present

## 2017-12-26 DIAGNOSIS — K219 Gastro-esophageal reflux disease without esophagitis: Secondary | ICD-10-CM | POA: Diagnosis not present

## 2017-12-26 DIAGNOSIS — M810 Age-related osteoporosis without current pathological fracture: Secondary | ICD-10-CM | POA: Diagnosis not present

## 2017-12-26 DIAGNOSIS — J111 Influenza due to unidentified influenza virus with other respiratory manifestations: Secondary | ICD-10-CM | POA: Diagnosis not present

## 2018-01-01 DIAGNOSIS — F17201 Nicotine dependence, unspecified, in remission: Secondary | ICD-10-CM | POA: Diagnosis not present

## 2018-01-01 DIAGNOSIS — F064 Anxiety disorder due to known physiological condition: Secondary | ICD-10-CM | POA: Diagnosis not present

## 2018-01-01 DIAGNOSIS — F0150 Vascular dementia without behavioral disturbance: Secondary | ICD-10-CM | POA: Diagnosis not present

## 2018-01-23 DIAGNOSIS — K5901 Slow transit constipation: Secondary | ICD-10-CM | POA: Diagnosis not present

## 2018-01-23 DIAGNOSIS — R600 Localized edema: Secondary | ICD-10-CM | POA: Diagnosis not present

## 2018-01-23 DIAGNOSIS — K219 Gastro-esophageal reflux disease without esophagitis: Secondary | ICD-10-CM | POA: Diagnosis not present

## 2018-01-23 DIAGNOSIS — I10 Essential (primary) hypertension: Secondary | ICD-10-CM | POA: Diagnosis not present

## 2018-01-23 DIAGNOSIS — T7849XS Other allergy, sequela: Secondary | ICD-10-CM | POA: Diagnosis not present

## 2018-01-23 DIAGNOSIS — I639 Cerebral infarction, unspecified: Secondary | ICD-10-CM | POA: Diagnosis not present

## 2018-01-23 DIAGNOSIS — D649 Anemia, unspecified: Secondary | ICD-10-CM | POA: Diagnosis not present

## 2018-01-23 DIAGNOSIS — M810 Age-related osteoporosis without current pathological fracture: Secondary | ICD-10-CM | POA: Diagnosis not present

## 2018-01-23 DIAGNOSIS — J111 Influenza due to unidentified influenza virus with other respiratory manifestations: Secondary | ICD-10-CM | POA: Diagnosis not present

## 2018-02-13 DIAGNOSIS — B351 Tinea unguium: Secondary | ICD-10-CM | POA: Diagnosis not present

## 2018-02-13 DIAGNOSIS — I739 Peripheral vascular disease, unspecified: Secondary | ICD-10-CM | POA: Diagnosis not present

## 2018-02-13 DIAGNOSIS — L853 Xerosis cutis: Secondary | ICD-10-CM | POA: Diagnosis not present

## 2018-02-13 DIAGNOSIS — L603 Nail dystrophy: Secondary | ICD-10-CM | POA: Diagnosis not present

## 2018-02-13 DIAGNOSIS — Q845 Enlarged and hypertrophic nails: Secondary | ICD-10-CM | POA: Diagnosis not present

## 2018-02-13 DIAGNOSIS — M2010 Hallux valgus (acquired), unspecified foot: Secondary | ICD-10-CM | POA: Diagnosis not present

## 2018-02-16 DIAGNOSIS — J111 Influenza due to unidentified influenza virus with other respiratory manifestations: Secondary | ICD-10-CM | POA: Diagnosis not present

## 2018-02-16 DIAGNOSIS — R600 Localized edema: Secondary | ICD-10-CM | POA: Diagnosis not present

## 2018-02-16 DIAGNOSIS — K5901 Slow transit constipation: Secondary | ICD-10-CM | POA: Diagnosis not present

## 2018-02-16 DIAGNOSIS — I639 Cerebral infarction, unspecified: Secondary | ICD-10-CM | POA: Diagnosis not present

## 2018-02-16 DIAGNOSIS — E559 Vitamin D deficiency, unspecified: Secondary | ICD-10-CM | POA: Diagnosis not present

## 2018-02-16 DIAGNOSIS — I10 Essential (primary) hypertension: Secondary | ICD-10-CM | POA: Diagnosis not present

## 2018-02-16 DIAGNOSIS — T7849XS Other allergy, sequela: Secondary | ICD-10-CM | POA: Diagnosis not present

## 2018-02-16 DIAGNOSIS — D649 Anemia, unspecified: Secondary | ICD-10-CM | POA: Diagnosis not present

## 2018-02-16 DIAGNOSIS — K219 Gastro-esophageal reflux disease without esophagitis: Secondary | ICD-10-CM | POA: Diagnosis not present

## 2018-02-20 DIAGNOSIS — F0150 Vascular dementia without behavioral disturbance: Secondary | ICD-10-CM | POA: Diagnosis not present

## 2018-02-20 DIAGNOSIS — F064 Anxiety disorder due to known physiological condition: Secondary | ICD-10-CM | POA: Diagnosis not present

## 2018-02-20 DIAGNOSIS — F17201 Nicotine dependence, unspecified, in remission: Secondary | ICD-10-CM | POA: Diagnosis not present

## 2018-03-13 DIAGNOSIS — F17201 Nicotine dependence, unspecified, in remission: Secondary | ICD-10-CM | POA: Diagnosis not present

## 2018-03-13 DIAGNOSIS — F0150 Vascular dementia without behavioral disturbance: Secondary | ICD-10-CM | POA: Diagnosis not present

## 2018-03-13 DIAGNOSIS — F064 Anxiety disorder due to known physiological condition: Secondary | ICD-10-CM | POA: Diagnosis not present

## 2018-03-20 DIAGNOSIS — I10 Essential (primary) hypertension: Secondary | ICD-10-CM | POA: Diagnosis not present

## 2018-03-20 DIAGNOSIS — K5901 Slow transit constipation: Secondary | ICD-10-CM | POA: Diagnosis not present

## 2018-03-20 DIAGNOSIS — I639 Cerebral infarction, unspecified: Secondary | ICD-10-CM | POA: Diagnosis not present

## 2018-03-20 DIAGNOSIS — K219 Gastro-esophageal reflux disease without esophagitis: Secondary | ICD-10-CM | POA: Diagnosis not present

## 2018-03-20 DIAGNOSIS — R600 Localized edema: Secondary | ICD-10-CM | POA: Diagnosis not present

## 2018-03-20 DIAGNOSIS — E559 Vitamin D deficiency, unspecified: Secondary | ICD-10-CM | POA: Diagnosis not present

## 2018-03-20 DIAGNOSIS — J111 Influenza due to unidentified influenza virus with other respiratory manifestations: Secondary | ICD-10-CM | POA: Diagnosis not present

## 2018-03-20 DIAGNOSIS — D649 Anemia, unspecified: Secondary | ICD-10-CM | POA: Diagnosis not present

## 2018-03-20 DIAGNOSIS — T7849XS Other allergy, sequela: Secondary | ICD-10-CM | POA: Diagnosis not present

## 2018-03-23 DIAGNOSIS — J111 Influenza due to unidentified influenza virus with other respiratory manifestations: Secondary | ICD-10-CM | POA: Diagnosis not present

## 2018-03-23 DIAGNOSIS — K219 Gastro-esophageal reflux disease without esophagitis: Secondary | ICD-10-CM | POA: Diagnosis not present

## 2018-03-23 DIAGNOSIS — E559 Vitamin D deficiency, unspecified: Secondary | ICD-10-CM | POA: Diagnosis not present

## 2018-03-23 DIAGNOSIS — R600 Localized edema: Secondary | ICD-10-CM | POA: Diagnosis not present

## 2018-03-23 DIAGNOSIS — T7849XS Other allergy, sequela: Secondary | ICD-10-CM | POA: Diagnosis not present

## 2018-03-23 DIAGNOSIS — D649 Anemia, unspecified: Secondary | ICD-10-CM | POA: Diagnosis not present

## 2018-03-23 DIAGNOSIS — I10 Essential (primary) hypertension: Secondary | ICD-10-CM | POA: Diagnosis not present

## 2018-03-23 DIAGNOSIS — K5901 Slow transit constipation: Secondary | ICD-10-CM | POA: Diagnosis not present

## 2018-03-23 DIAGNOSIS — I639 Cerebral infarction, unspecified: Secondary | ICD-10-CM | POA: Diagnosis not present

## 2018-04-10 DIAGNOSIS — F0150 Vascular dementia without behavioral disturbance: Secondary | ICD-10-CM | POA: Diagnosis not present

## 2018-04-10 DIAGNOSIS — F17201 Nicotine dependence, unspecified, in remission: Secondary | ICD-10-CM | POA: Diagnosis not present

## 2018-04-10 DIAGNOSIS — F064 Anxiety disorder due to known physiological condition: Secondary | ICD-10-CM | POA: Diagnosis not present

## 2018-04-17 DIAGNOSIS — M2010 Hallux valgus (acquired), unspecified foot: Secondary | ICD-10-CM | POA: Diagnosis not present

## 2018-04-17 DIAGNOSIS — B351 Tinea unguium: Secondary | ICD-10-CM | POA: Diagnosis not present

## 2018-04-17 DIAGNOSIS — L603 Nail dystrophy: Secondary | ICD-10-CM | POA: Diagnosis not present

## 2018-04-17 DIAGNOSIS — L853 Xerosis cutis: Secondary | ICD-10-CM | POA: Diagnosis not present

## 2018-04-17 DIAGNOSIS — I739 Peripheral vascular disease, unspecified: Secondary | ICD-10-CM | POA: Diagnosis not present

## 2018-04-17 DIAGNOSIS — Q845 Enlarged and hypertrophic nails: Secondary | ICD-10-CM | POA: Diagnosis not present

## 2018-04-24 DIAGNOSIS — R600 Localized edema: Secondary | ICD-10-CM | POA: Diagnosis not present

## 2018-04-24 DIAGNOSIS — I10 Essential (primary) hypertension: Secondary | ICD-10-CM | POA: Diagnosis not present

## 2018-04-24 DIAGNOSIS — J111 Influenza due to unidentified influenza virus with other respiratory manifestations: Secondary | ICD-10-CM | POA: Diagnosis not present

## 2018-04-24 DIAGNOSIS — K219 Gastro-esophageal reflux disease without esophagitis: Secondary | ICD-10-CM | POA: Diagnosis not present

## 2018-04-24 DIAGNOSIS — E559 Vitamin D deficiency, unspecified: Secondary | ICD-10-CM | POA: Diagnosis not present

## 2018-04-24 DIAGNOSIS — D649 Anemia, unspecified: Secondary | ICD-10-CM | POA: Diagnosis not present

## 2018-04-24 DIAGNOSIS — I639 Cerebral infarction, unspecified: Secondary | ICD-10-CM | POA: Diagnosis not present

## 2018-04-24 DIAGNOSIS — K5901 Slow transit constipation: Secondary | ICD-10-CM | POA: Diagnosis not present

## 2018-04-24 DIAGNOSIS — T7849XS Other allergy, sequela: Secondary | ICD-10-CM | POA: Diagnosis not present

## 2018-05-04 DIAGNOSIS — M810 Age-related osteoporosis without current pathological fracture: Secondary | ICD-10-CM | POA: Diagnosis not present

## 2018-05-04 DIAGNOSIS — F17201 Nicotine dependence, unspecified, in remission: Secondary | ICD-10-CM | POA: Diagnosis not present

## 2018-05-04 DIAGNOSIS — F064 Anxiety disorder due to known physiological condition: Secondary | ICD-10-CM | POA: Diagnosis not present

## 2018-05-04 DIAGNOSIS — F0150 Vascular dementia without behavioral disturbance: Secondary | ICD-10-CM | POA: Diagnosis not present

## 2018-05-04 DIAGNOSIS — I639 Cerebral infarction, unspecified: Secondary | ICD-10-CM | POA: Diagnosis not present

## 2018-05-08 DIAGNOSIS — F17201 Nicotine dependence, unspecified, in remission: Secondary | ICD-10-CM | POA: Diagnosis not present

## 2018-05-08 DIAGNOSIS — F064 Anxiety disorder due to known physiological condition: Secondary | ICD-10-CM | POA: Diagnosis not present

## 2018-05-08 DIAGNOSIS — F0150 Vascular dementia without behavioral disturbance: Secondary | ICD-10-CM | POA: Diagnosis not present

## 2018-06-19 DIAGNOSIS — I739 Peripheral vascular disease, unspecified: Secondary | ICD-10-CM | POA: Diagnosis not present

## 2018-06-19 DIAGNOSIS — L853 Xerosis cutis: Secondary | ICD-10-CM | POA: Diagnosis not present

## 2018-06-19 DIAGNOSIS — M2010 Hallux valgus (acquired), unspecified foot: Secondary | ICD-10-CM | POA: Diagnosis not present

## 2018-06-19 DIAGNOSIS — Q845 Enlarged and hypertrophic nails: Secondary | ICD-10-CM | POA: Diagnosis not present

## 2018-06-19 DIAGNOSIS — L603 Nail dystrophy: Secondary | ICD-10-CM | POA: Diagnosis not present

## 2018-06-19 DIAGNOSIS — B351 Tinea unguium: Secondary | ICD-10-CM | POA: Diagnosis not present

## 2018-06-26 DIAGNOSIS — D649 Anemia, unspecified: Secondary | ICD-10-CM | POA: Diagnosis not present

## 2018-06-26 DIAGNOSIS — I10 Essential (primary) hypertension: Secondary | ICD-10-CM | POA: Diagnosis not present

## 2018-06-26 DIAGNOSIS — I639 Cerebral infarction, unspecified: Secondary | ICD-10-CM | POA: Diagnosis not present

## 2018-06-26 DIAGNOSIS — T7849XS Other allergy, sequela: Secondary | ICD-10-CM | POA: Diagnosis not present

## 2018-06-26 DIAGNOSIS — J111 Influenza due to unidentified influenza virus with other respiratory manifestations: Secondary | ICD-10-CM | POA: Diagnosis not present

## 2018-06-26 DIAGNOSIS — K5901 Slow transit constipation: Secondary | ICD-10-CM | POA: Diagnosis not present

## 2018-06-26 DIAGNOSIS — R600 Localized edema: Secondary | ICD-10-CM | POA: Diagnosis not present

## 2018-06-26 DIAGNOSIS — E559 Vitamin D deficiency, unspecified: Secondary | ICD-10-CM | POA: Diagnosis not present

## 2018-06-26 DIAGNOSIS — K219 Gastro-esophageal reflux disease without esophagitis: Secondary | ICD-10-CM | POA: Diagnosis not present

## 2018-07-03 DIAGNOSIS — F0150 Vascular dementia without behavioral disturbance: Secondary | ICD-10-CM | POA: Diagnosis not present

## 2018-07-03 DIAGNOSIS — F17201 Nicotine dependence, unspecified, in remission: Secondary | ICD-10-CM | POA: Diagnosis not present

## 2018-07-03 DIAGNOSIS — F064 Anxiety disorder due to known physiological condition: Secondary | ICD-10-CM | POA: Diagnosis not present

## 2018-07-24 DIAGNOSIS — K5901 Slow transit constipation: Secondary | ICD-10-CM | POA: Diagnosis not present

## 2018-07-24 DIAGNOSIS — H1013 Acute atopic conjunctivitis, bilateral: Secondary | ICD-10-CM | POA: Diagnosis not present

## 2018-07-24 DIAGNOSIS — I639 Cerebral infarction, unspecified: Secondary | ICD-10-CM | POA: Diagnosis not present

## 2018-07-24 DIAGNOSIS — E559 Vitamin D deficiency, unspecified: Secondary | ICD-10-CM | POA: Diagnosis not present

## 2018-07-24 DIAGNOSIS — T7849XS Other allergy, sequela: Secondary | ICD-10-CM | POA: Diagnosis not present

## 2018-07-24 DIAGNOSIS — R600 Localized edema: Secondary | ICD-10-CM | POA: Diagnosis not present

## 2018-07-24 DIAGNOSIS — I10 Essential (primary) hypertension: Secondary | ICD-10-CM | POA: Diagnosis not present

## 2018-07-24 DIAGNOSIS — D649 Anemia, unspecified: Secondary | ICD-10-CM | POA: Diagnosis not present

## 2018-07-24 DIAGNOSIS — K219 Gastro-esophageal reflux disease without esophagitis: Secondary | ICD-10-CM | POA: Diagnosis not present

## 2018-07-31 DIAGNOSIS — F17201 Nicotine dependence, unspecified, in remission: Secondary | ICD-10-CM | POA: Diagnosis not present

## 2018-07-31 DIAGNOSIS — F0150 Vascular dementia without behavioral disturbance: Secondary | ICD-10-CM | POA: Diagnosis not present

## 2018-07-31 DIAGNOSIS — F064 Anxiety disorder due to known physiological condition: Secondary | ICD-10-CM | POA: Diagnosis not present

## 2018-08-08 DIAGNOSIS — R600 Localized edema: Secondary | ICD-10-CM | POA: Diagnosis not present

## 2018-08-08 DIAGNOSIS — H1013 Acute atopic conjunctivitis, bilateral: Secondary | ICD-10-CM | POA: Diagnosis not present

## 2018-08-08 DIAGNOSIS — T7849XS Other allergy, sequela: Secondary | ICD-10-CM | POA: Diagnosis not present

## 2018-08-08 DIAGNOSIS — E559 Vitamin D deficiency, unspecified: Secondary | ICD-10-CM | POA: Diagnosis not present

## 2018-08-08 DIAGNOSIS — K219 Gastro-esophageal reflux disease without esophagitis: Secondary | ICD-10-CM | POA: Diagnosis not present

## 2018-08-08 DIAGNOSIS — I639 Cerebral infarction, unspecified: Secondary | ICD-10-CM | POA: Diagnosis not present

## 2018-08-08 DIAGNOSIS — D649 Anemia, unspecified: Secondary | ICD-10-CM | POA: Diagnosis not present

## 2018-08-08 DIAGNOSIS — I10 Essential (primary) hypertension: Secondary | ICD-10-CM | POA: Diagnosis not present

## 2018-08-08 DIAGNOSIS — K5901 Slow transit constipation: Secondary | ICD-10-CM | POA: Diagnosis not present

## 2018-11-10 DEATH — deceased
